# Patient Record
Sex: Female | Born: 1945
Health system: Southern US, Community
[De-identification: ages and names within clinical notes are randomized; demographics above are authoritative.]

## PROBLEM LIST (undated history)

## (undated) DIAGNOSIS — M5136 Other intervertebral disc degeneration, lumbar region: Secondary | ICD-10-CM

## (undated) DIAGNOSIS — K219 Gastro-esophageal reflux disease without esophagitis: Secondary | ICD-10-CM

## (undated) DIAGNOSIS — M51369 Other intervertebral disc degeneration, lumbar region without mention of lumbar back pain or lower extremity pain: Secondary | ICD-10-CM

## (undated) DIAGNOSIS — M1990 Unspecified osteoarthritis, unspecified site: Secondary | ICD-10-CM

## (undated) DIAGNOSIS — E785 Hyperlipidemia, unspecified: Secondary | ICD-10-CM

## (undated) DIAGNOSIS — M5412 Radiculopathy, cervical region: Secondary | ICD-10-CM

## (undated) DIAGNOSIS — G8929 Other chronic pain: Secondary | ICD-10-CM

## (undated) DIAGNOSIS — T7840XA Allergy, unspecified, initial encounter: Secondary | ICD-10-CM

## (undated) DIAGNOSIS — Z9889 Other specified postprocedural states: Secondary | ICD-10-CM

## (undated) DIAGNOSIS — N182 Chronic kidney disease, stage 2 (mild): Secondary | ICD-10-CM

## (undated) DIAGNOSIS — I10 Essential (primary) hypertension: Secondary | ICD-10-CM

## (undated) DIAGNOSIS — R112 Nausea with vomiting, unspecified: Secondary | ICD-10-CM

## (undated) DIAGNOSIS — M8580 Other specified disorders of bone density and structure, unspecified site: Secondary | ICD-10-CM

## (undated) HISTORY — PX: OTHER SURGICAL HISTORY: SHX169

## (undated) HISTORY — DX: Essential (primary) hypertension: I10

## (undated) HISTORY — DX: Other chronic pain: G89.29

## (undated) HISTORY — DX: Hyperlipidemia, unspecified: E78.5

## (undated) HISTORY — DX: Other specified disorders of bone density and structure, unspecified site: M85.80

## (undated) HISTORY — DX: Radiculopathy, cervical region: M54.12

## (undated) HISTORY — PX: JOINT REPLACEMENT: SHX530

## (undated) HISTORY — PX: APPENDECTOMY: SHX54

## (undated) HISTORY — PX: BUNIONECTOMY: SHX129

## (undated) HISTORY — DX: Unspecified osteoarthritis, unspecified site: M19.90

## (undated) HISTORY — PX: TUBAL LIGATION: SHX77

## (undated) HISTORY — DX: Chronic kidney disease, stage 2 (mild): N18.2

## (undated) HISTORY — PX: COLONOSCOPY: SHX174

## (undated) HISTORY — DX: Allergy, unspecified, initial encounter: T78.40XA

## (undated) HISTORY — PX: BREAST LUMPECTOMY: SHX2

---

## 1990-12-19 HISTORY — PX: BREAST BIOPSY: SHX20

## 1990-12-19 HISTORY — PX: BREAST LUMPECTOMY: SHX2

## 2006-12-19 HISTORY — PX: OTHER SURGICAL HISTORY: SHX169

## 2006-12-19 HISTORY — PX: JOINT REPLACEMENT: SHX530

## 2010-12-19 DIAGNOSIS — J189 Pneumonia, unspecified organism: Secondary | ICD-10-CM

## 2010-12-19 HISTORY — DX: Pneumonia, unspecified organism: J18.9

## 2011-12-20 HISTORY — PX: SPINE SURGERY: SHX786

## 2012-09-18 HISTORY — PX: CERVICAL DISCECTOMY: SHX98

## 2013-03-19 LAB — HM PAP SMEAR

## 2014-01-06 DIAGNOSIS — M1711 Unilateral primary osteoarthritis, right knee: Secondary | ICD-10-CM | POA: Insufficient documentation

## 2014-01-06 DIAGNOSIS — I10 Essential (primary) hypertension: Secondary | ICD-10-CM | POA: Insufficient documentation

## 2014-01-06 DIAGNOSIS — M159 Polyosteoarthritis, unspecified: Secondary | ICD-10-CM | POA: Insufficient documentation

## 2014-01-06 DIAGNOSIS — J309 Allergic rhinitis, unspecified: Secondary | ICD-10-CM | POA: Insufficient documentation

## 2014-01-06 DIAGNOSIS — M129 Arthropathy, unspecified: Secondary | ICD-10-CM | POA: Insufficient documentation

## 2015-01-01 DIAGNOSIS — R0982 Postnasal drip: Secondary | ICD-10-CM | POA: Diagnosis not present

## 2015-01-01 DIAGNOSIS — Z1239 Encounter for other screening for malignant neoplasm of breast: Secondary | ICD-10-CM | POA: Diagnosis not present

## 2015-01-01 DIAGNOSIS — J309 Allergic rhinitis, unspecified: Secondary | ICD-10-CM | POA: Diagnosis not present

## 2015-01-01 DIAGNOSIS — G4700 Insomnia, unspecified: Secondary | ICD-10-CM | POA: Diagnosis not present

## 2015-01-01 DIAGNOSIS — H9193 Unspecified hearing loss, bilateral: Secondary | ICD-10-CM | POA: Diagnosis not present

## 2015-01-01 DIAGNOSIS — K5900 Constipation, unspecified: Secondary | ICD-10-CM | POA: Diagnosis not present

## 2015-01-19 DIAGNOSIS — J309 Allergic rhinitis, unspecified: Secondary | ICD-10-CM | POA: Diagnosis not present

## 2015-01-19 DIAGNOSIS — J342 Deviated nasal septum: Secondary | ICD-10-CM | POA: Diagnosis not present

## 2015-01-19 DIAGNOSIS — F458 Other somatoform disorders: Secondary | ICD-10-CM | POA: Diagnosis not present

## 2015-01-23 DIAGNOSIS — J301 Allergic rhinitis due to pollen: Secondary | ICD-10-CM | POA: Diagnosis not present

## 2015-03-25 DIAGNOSIS — M533 Sacrococcygeal disorders, not elsewhere classified: Secondary | ICD-10-CM | POA: Diagnosis not present

## 2015-04-21 ENCOUNTER — Other Ambulatory Visit: Payer: Self-pay

## 2015-04-21 DIAGNOSIS — Z1389 Encounter for screening for other disorder: Secondary | ICD-10-CM | POA: Diagnosis not present

## 2015-04-21 DIAGNOSIS — M5416 Radiculopathy, lumbar region: Secondary | ICD-10-CM | POA: Diagnosis not present

## 2015-04-21 DIAGNOSIS — Z9181 History of falling: Secondary | ICD-10-CM | POA: Diagnosis not present

## 2015-04-21 DIAGNOSIS — G8929 Other chronic pain: Secondary | ICD-10-CM | POA: Diagnosis not present

## 2015-04-21 DIAGNOSIS — Z0000 Encounter for general adult medical examination without abnormal findings: Secondary | ICD-10-CM | POA: Diagnosis not present

## 2015-04-21 DIAGNOSIS — Z23 Encounter for immunization: Secondary | ICD-10-CM | POA: Diagnosis not present

## 2015-04-21 DIAGNOSIS — Z1239 Encounter for other screening for malignant neoplasm of breast: Secondary | ICD-10-CM | POA: Diagnosis not present

## 2015-04-21 DIAGNOSIS — Z1231 Encounter for screening mammogram for malignant neoplasm of breast: Secondary | ICD-10-CM

## 2015-05-07 ENCOUNTER — Other Ambulatory Visit: Payer: Self-pay

## 2015-05-07 ENCOUNTER — Ambulatory Visit
Admission: RE | Admit: 2015-05-07 | Discharge: 2015-05-07 | Disposition: A | Discharge status: Home / Self Care | Payer: Commercial Managed Care - HMO | Source: Ambulatory Visit | Attending: Family Medicine | Admitting: Family Medicine

## 2015-05-07 DIAGNOSIS — Z1231 Encounter for screening mammogram for malignant neoplasm of breast: Secondary | ICD-10-CM

## 2015-05-12 DIAGNOSIS — J358 Other chronic diseases of tonsils and adenoids: Secondary | ICD-10-CM | POA: Diagnosis not present

## 2015-05-22 ENCOUNTER — Telehealth: Payer: Self-pay | Admitting: Family Medicine

## 2015-05-22 DIAGNOSIS — J358 Other chronic diseases of tonsils and adenoids: Secondary | ICD-10-CM

## 2015-05-25 NOTE — Telephone Encounter (Signed)
Referral ordered

## 2015-05-27 ENCOUNTER — Encounter: Payer: Self-pay | Admitting: Family Medicine

## 2015-05-27 ENCOUNTER — Encounter (INDEPENDENT_AMBULATORY_CARE_PROVIDER_SITE_OTHER): Payer: Self-pay

## 2015-05-27 ENCOUNTER — Ambulatory Visit (INDEPENDENT_AMBULATORY_CARE_PROVIDER_SITE_OTHER): Payer: Commercial Managed Care - HMO | Admitting: Family Medicine

## 2015-05-27 VITALS — BP 122/74 | HR 83 | Temp 98.4°F | Resp 18 | Ht 63.0 in | Wt 145.0 lb

## 2015-05-27 DIAGNOSIS — K219 Gastro-esophageal reflux disease without esophagitis: Secondary | ICD-10-CM | POA: Insufficient documentation

## 2015-05-27 DIAGNOSIS — M4800 Spinal stenosis, site unspecified: Secondary | ICD-10-CM | POA: Diagnosis not present

## 2015-05-27 DIAGNOSIS — Z13220 Encounter for screening for lipoid disorders: Secondary | ICD-10-CM | POA: Diagnosis not present

## 2015-05-27 DIAGNOSIS — H9193 Unspecified hearing loss, bilateral: Secondary | ICD-10-CM | POA: Insufficient documentation

## 2015-05-27 DIAGNOSIS — M5416 Radiculopathy, lumbar region: Secondary | ICD-10-CM

## 2015-05-27 DIAGNOSIS — G8929 Other chronic pain: Secondary | ICD-10-CM | POA: Diagnosis not present

## 2015-05-27 DIAGNOSIS — Z0000 Encounter for general adult medical examination without abnormal findings: Secondary | ICD-10-CM | POA: Diagnosis not present

## 2015-05-27 DIAGNOSIS — Z13820 Encounter for screening for osteoporosis: Secondary | ICD-10-CM | POA: Insufficient documentation

## 2015-05-27 DIAGNOSIS — I10 Essential (primary) hypertension: Secondary | ICD-10-CM | POA: Diagnosis not present

## 2015-05-27 DIAGNOSIS — M5410 Radiculopathy, site unspecified: Secondary | ICD-10-CM

## 2015-05-27 NOTE — Progress Notes (Signed)
Name: Rachel James   MRN: [id number]    DOB: 10-23-1946   Date:05/27/2015       Progress Note  Subjective  Chief Complaint  Chief Complaint  Patient presents with  . Referral    Bone Density     HPI  Patient is here today for a Female Medicare Wellness Visit:  The patient has been in otherwise good general health and voices no acute concerns today. She needs a referral renewal for Dr. Sharlet James. She would also like to order her DEXA scan and routine blood work pertinent to her medical problems. Rachel James has a form from [facility] that needs to be completed and returned.   No problem-specific assessment & plan notes found for this encounter.  Active Ambulatory Problems    Diagnosis Date Noted  . Allergic rhinitis 01/06/2014  . Osteoarthrosis involving multiple sites 01/06/2014  . Arthropathia 01/06/2014  . Hypertension goal BP (blood pressure) < 150/90 01/06/2014  . Bilateral change in hearing 05/27/2015  . Spinal stenosis, multilevel 05/27/2015  . GERD (gastroesophageal reflux disease) 05/27/2015  . Chronic radicular low back pain 05/27/2015  . Screening for osteoporosis 05/27/2015  . Encounter for Medicare annual wellness exam 05/27/2015  . Screening cholesterol level 05/27/2015   Resolved Ambulatory Problems    Diagnosis Date Noted  . No Resolved Ambulatory Problems   No Additional Past Medical History    Past Surgical History  Procedure Laterality Date  . Tubal ligation Bilateral   . Breast lumpectomy Right   . Cervical discectomy  09/18/2012    C4-C5 AND C6-C7 ACDF   . Bunionectomy Bilateral   .  thumb surgery Left     Family History  Problem Relation Age of Onset  . Heart disease Mother   . Heart disease Father   . Heart disease Sister   . Heart disease Brother     History   Social History  . Marital Status: Unknown    Spouse Name: N/A  . Number of Children: N/A  . Years of Education: N/A   Occupational History  . retired    Social History  Main Topics  . Smoking status: Never Smoker   . Smokeless tobacco: Not on file  . Alcohol Use: No  . Drug Use: No  . Sexual Activity: Not Currently   Other Topics Concern  . Not on file   Social History Narrative     Current outpatient prescriptions:  .  ibuprofen (ADVIL,MOTRIN) 600 MG tablet, Take 600 mg by mouth., Disp: , Rfl:  .  ALPRAZolam (XANAX) 0.5 MG tablet, , Disp: , Rfl:  .  Cetirizine HCl 10 MG CAPS, , Disp: , Rfl:  .  chlorthalidone (HYGROTON) 25 MG tablet, Take by mouth., Disp: , Rfl:  .  fexofenadine-pseudoephedrine (ALLEGRA-D 24) 180-240 MG per 24 hr tablet, , Disp: , Rfl:  .  Heat Wraps (HEAT THERAPY PATCHES) MISC, , Disp: , Rfl:  .  loratadine (CLARITIN) 10 MG tablet, Take by mouth., Disp: , Rfl:  .  metoprolol succinate (TOPROL-XL) 25 MG 24 hr tablet, , Disp: , Rfl:  .  MULTIPLE VITAMINS-MINERALS ER PO, Take by mouth., Disp: , Rfl:  .  NAPROXEN 375 MG TBEC EC tablet, , Disp: , Rfl:  .  omeprazole (PRILOSEC) 20 MG capsule, Take 20 mg by mouth., Disp: , Rfl:  .  POTASSIUM CITRATE PO, Take 99 mg by mouth., Disp: , Rfl:  .  Sunscreens (RAY BLOCK SUNSCREEN) 3-7 % LOTN, , Disp: , Rfl:  Allergies  Allergen Reactions  . Codeine Other (See Comments) and Itching    nightmare    Fall Risk: Fall Risk  05/27/2015  Falls in the past year? No    Depression screen [facility] 2/9 05/27/2015  Decreased Interest 0  Down, Depressed, Hopeless 0  PHQ - 2 Score 0     ROS  CONSTITUTIONAL: No significant weight changes, fever, chills, weakness or fatigue.  HEENT:  - Eyes: No visual changes.  - Ears: No auditory changes. No pain.  - Nose: No sneezing, congestion, runny nose. - Throat: No sore throat. No changes in swallowing. SKIN: No rash or itching.  CARDIOVASCULAR: No chest pain, chest pressure or chest discomfort. No palpitations or edema.  RESPIRATORY: No shortness of breath, cough or sputum.  GASTROINTESTINAL: No anorexia, nausea, vomiting. No changes in bowel habits. No  abdominal pain or blood.  GENITOURINARY: No dysuria. No frequency. No discharge.  NEUROLOGICAL: No headache, dizziness, syncope, paralysis, ataxia, numbness or tingling in the extremities. No memory changes. No change in bowel or bladder control.  MUSCULOSKELETAL: No joint pain. No muscle pain. HEMATOLOGIC: No anemia, bleeding or bruising.  LYMPHATICS: No enlarged lymph nodes.  PSYCHIATRIC: No change in mood. No change in sleep pattern.  ENDOCRINOLOGIC: No reports of sweating, cold or heat intolerance. No polyuria or polydipsia.   Objective  Filed Vitals:   05/27/15 1042  BP: 122/74  Pulse: 83  Temp: 98.4 F (36.9 C)  TempSrc: Oral  Resp: 18  Height: 5\' 3"  (1.6 m)  Weight: 145 lb (65.772 kg)  SpO2: 94%    Physical Exam  Constitutional: Patient appears well-developed and well-nourished. In no distress.  HEENT:  - Head: Normocephalic and atraumatic.  - Ears: Bilateral TMs gray, no erythema or effusion - Nose: Nasal mucosa moist - Mouth/Throat: Oropharynx is clear and moist. No tonsillar hypertrophy or erythema. No post nasal drainage.  - Eyes: Conjunctivae clear, EOM movements normal. PERRLA. No scleral icterus.  Neck: Normal range of motion. Neck supple. No JVD present. No thyromegaly present.  Cardiovascular: Normal rate, regular rhythm and normal heart sounds.  No murmur heard.  Pulmonary/Chest: Effort normal and breath sounds normal. No respiratory distress. Musculoskeletal: Normal range of motion bilateral UE and LE, no joint effusions. Peripheral vascular: Bilateral LE no edema. Neurological: CN II-XII grossly intact with no focal deficits. Alert and oriented to person, place, and time. Coordination, balance, strength, speech and gait are normal.  Skin: Skin is warm and dry. No rash noted. No erythema.  Psychiatric: Patient has a normal mood and affect. Behavior is normal in office today. Judgment and thought content normal in office today.   Assessment & Plan  1.  Encounter for Medicare annual wellness exam Functional ability/safety issues: No Issues Hearing issues: Addressed  Activities of daily living: Discussed Home safety issues: No Issues  End Of Life Planning: Offered verbal information regarding advanced directives, healthcare power of attorney.  Preventative care, Health maintenance, Preventative health measures discussed.  Preventative screenings discussed today: lab work, colonoscopy, PAP, mammogram, DEXA.  Low Dose CT Chest recommended if Age 83-80 years, 30 pack-year currently smoking OR have quit w/in 15years.   Lifestyle risk factor issued reviewed: Diet, exercise, weight management, advised patient smoking is not healthy, nutrition/diet.  Preventative health measures discussed (5-10 year plan).  Reviewed and recommended vaccinations: - Pneumovax  - Prevnar  - Annual Influenza - Zostavax - Tdap   Depression screening: Done Fall risk screening: Done Discuss ADLs/IADLs: Done  Current medical providers: See  HPI  Other health risk factors identified this visit: No other issues Cognitive impairment issues: None identified  All above discussed with patient. Appropriate education, counseling and referral will be made based upon the above.    2. Spinal stenosis, multilevel Referal placed.  - Ambulatory referral to Sports Medicine - Vitamin D (25 hydroxy)  3. Chronic radicular low back pain Referal placed.  - Ambulatory referral to Sports Medicine - Vitamin D (25 hydroxy)  4. Screening for osteoporosis DEXA ordered.  - DG Bone Density; Future  5. Hypertension goal BP (blood pressure) < 150/90 Lab work ordered.  - CBC with Differential - COMPLETE METABOLIC PANEL WITH GFR - Vitamin D (25 hydroxy) - TSH  6. Screening cholesterol level Lab work ordered.  - Lipid Profile

## 2015-05-27 NOTE — Patient Instructions (Addendum)
  Ms. Wearing , Thank you for taking time to come for your Medicare Wellness Visit. I appreciate your ongoing commitment to your health goals. Please review the following plan we discussed and let me know if I can assist you in the future.   These are the goals we discussed: Maintain ideal BMI with regular exercise including toning and balance training. Eat regular meals consistent of a balanced diet. Continue mentally engaging activities such as puzzles, crosswords, reading, card games, social activities. Quit smoking if applicable. Complete advanced directives documentation with lawyer and bring copy to be scanned into medical records if not already done so. Complete any orders or referrals placed during today's visit.      This is a list of the screening recommended for you and due dates:  Health Maintenance  Topic Date Due  . Shingles Vaccine  02/27/2008  . Flu Shot  07/20/2015  . Mammogram  05/06/2017  . Colon Cancer Screening  01/19/2022  . Tetanus Vaccine  07/12/2024  . DEXA scan (bone density measurement)  Completed  . Pneumonia vaccines  Completed

## 2015-05-28 LAB — CBC WITH DIFFERENTIAL/PLATELET
Basophils Absolute: 0 10*3/uL (ref 0.0–0.2)
Basos: 1 %
EOS (ABSOLUTE): 0.2 10*3/uL (ref 0.0–0.4)
Eos: 4 %
HEMATOCRIT: 32.8 % — AB (ref 34.0–46.6)
HEMOGLOBIN: 11.1 g/dL (ref 11.1–15.9)
Immature Grans (Abs): 0 10*3/uL (ref 0.0–0.1)
Immature Granulocytes: 0 %
LYMPHS ABS: 1.5 10*3/uL (ref 0.7–3.1)
Lymphs: 27 %
MCH: 26.1 pg — AB (ref 26.6–33.0)
MCHC: 33.8 g/dL (ref 31.5–35.7)
MCV: 77 fL — ABNORMAL LOW (ref 79–97)
MONOCYTES: 8 %
MONOS ABS: 0.5 10*3/uL (ref 0.1–0.9)
Neutrophils Absolute: 3.3 10*3/uL (ref 1.4–7.0)
Neutrophils: 60 %
PLATELETS: 266 10*3/uL (ref 150–379)
RBC: 4.25 x10E6/uL (ref 3.77–5.28)
RDW: 14.7 % (ref 12.3–15.4)
WBC: 5.6 10*3/uL (ref 3.4–10.8)

## 2015-05-28 LAB — LIPID PANEL
Chol/HDL Ratio: 2.5 ratio units (ref 0.0–4.4)
Cholesterol, Total: 229 mg/dL — ABNORMAL HIGH (ref 100–199)
HDL: 90 mg/dL (ref >39)
LDL CALC: 126 mg/dL — AB (ref 0–99)
Triglycerides: 64 mg/dL (ref 0–149)
VLDL Cholesterol Cal: 13 mg/dL (ref 5–40)

## 2015-05-28 LAB — TSH: TSH: 1.38 u[IU]/mL (ref 0.450–4.500)

## 2015-05-28 LAB — VITAMIN D 25 HYDROXY (VIT D DEFICIENCY, FRACTURES): Vit D, 25-Hydroxy: 29.8 ng/mL — ABNORMAL LOW (ref 30.0–100.0)

## 2015-06-03 ENCOUNTER — Telehealth: Payer: Self-pay

## 2015-06-03 NOTE — Telephone Encounter (Signed)
Patient called and was asking about her referral to [facility]. Notified her , I have an appt scheduled for her on 06/24/2015 at 3:45pm with Dr. Beverly Gust.

## 2015-06-15 ENCOUNTER — Encounter: Payer: Self-pay | Admitting: Family Medicine

## 2015-06-15 NOTE — Telephone Encounter (Signed)
DEXA scan was ordered several weeks ago. Can we find out what the hold up is on scheduling this?

## 2015-06-16 ENCOUNTER — Other Ambulatory Visit: Payer: Self-pay | Admitting: Family Medicine

## 2015-06-16 ENCOUNTER — Encounter: Payer: Self-pay | Admitting: Family Medicine

## 2015-06-16 NOTE — Telephone Encounter (Signed)
Hud[place]eth, and scheduled the appt, she is going tomorrow 06/17/15 at 11:00am. Pt has been notified of this and I thanked her for her patience and understanding

## 2015-06-17 ENCOUNTER — Ambulatory Visit
Admission: RE | Admit: 2015-06-17 | Discharge: 2015-06-17 | Disposition: A | Discharge status: Home / Self Care | Payer: Commercial Managed Care - HMO | Source: Ambulatory Visit | Attending: Family Medicine | Admitting: Family Medicine

## 2015-06-17 DIAGNOSIS — Z780 Asymptomatic menopausal state: Secondary | ICD-10-CM | POA: Diagnosis not present

## 2015-06-17 DIAGNOSIS — M898X8 Other specified disorders of bone, other site: Secondary | ICD-10-CM | POA: Diagnosis not present

## 2015-06-17 DIAGNOSIS — M8580 Other specified disorders of bone density and structure, unspecified site: Secondary | ICD-10-CM | POA: Insufficient documentation

## 2015-06-17 DIAGNOSIS — Z13820 Encounter for screening for osteoporosis: Secondary | ICD-10-CM

## 2015-06-24 DIAGNOSIS — H60339 Swimmer's ear, unspecified ear: Secondary | ICD-10-CM | POA: Diagnosis not present

## 2015-06-24 DIAGNOSIS — J3501 Chronic tonsillitis: Secondary | ICD-10-CM | POA: Diagnosis not present

## 2015-06-24 DIAGNOSIS — J358 Other chronic diseases of tonsils and adenoids: Secondary | ICD-10-CM | POA: Diagnosis not present

## 2015-07-03 DIAGNOSIS — M47816 Spondylosis without myelopathy or radiculopathy, lumbar region: Secondary | ICD-10-CM | POA: Diagnosis not present

## 2015-07-03 DIAGNOSIS — M5136 Other intervertebral disc degeneration, lumbar region: Secondary | ICD-10-CM | POA: Diagnosis not present

## 2015-07-16 ENCOUNTER — Telehealth: Payer: Self-pay | Admitting: Family Medicine

## 2015-07-16 DIAGNOSIS — M4716 Other spondylosis with myelopathy, lumbar region: Secondary | ICD-10-CM | POA: Insufficient documentation

## 2015-07-16 DIAGNOSIS — M47816 Spondylosis without myelopathy or radiculopathy, lumbar region: Secondary | ICD-10-CM | POA: Diagnosis not present

## 2015-07-16 DIAGNOSIS — M47817 Spondylosis without myelopathy or radiculopathy, lumbosacral region: Secondary | ICD-10-CM | POA: Insufficient documentation

## 2015-07-16 NOTE — Telephone Encounter (Signed)
PT STATES THAT HER BLOOD PRESSURE IS VARYING. SHE IS LIGHT HEADED AND SINCE tUESDAY  TO KNOW IT IS RUNNING 150-96 150-94. WANTS TO KNOW IF SHE NEEDS TO MAKE AN APPT OR WHAT DO YOU SUGGEST.

## 2015-07-16 NOTE — Telephone Encounter (Signed)
Pt coming in on aug1st and was informed to bring machine at visit

## 2015-07-16 NOTE — Telephone Encounter (Signed)
Yes office visit and instruct patient to bring home blood pressure machine with her to compare.

## 2015-07-20 ENCOUNTER — Ambulatory Visit (INDEPENDENT_AMBULATORY_CARE_PROVIDER_SITE_OTHER): Payer: Commercial Managed Care - HMO | Admitting: Family Medicine

## 2015-07-20 ENCOUNTER — Encounter: Payer: Self-pay | Admitting: Family Medicine

## 2015-07-20 VITALS — BP 142/92 | HR 82 | Temp 99.0°F | Resp 14 | Ht 63.0 in | Wt 144.2 lb

## 2015-07-20 DIAGNOSIS — I10 Essential (primary) hypertension: Secondary | ICD-10-CM

## 2015-07-20 NOTE — Progress Notes (Signed)
Name: Rachel James   MRN: [id number]    DOB: 1946-05-31   Date:07/20/2015       Progress Note  Subjective  Chief Complaint  Chief Complaint  Patient presents with  . Hypertension    running high on at home bp monitor which compare with ours.  Also experienced some light headedness last week     HPI  Patient is here for routine follow up of Hypertension. First diagnosed with hypertension several years ago. Current anti-hypertension medication regimen includes dietary modification, weight management and Metoprolol Succinate 25mg  one a day.  She self discontinued Chlorthalidone 25mg  one a day after relating it to night time muscle cramps in her legs despite taking potassium and magnesium. Muscle cramps have since resolved.  Patient is following physician recommended management otherwise. Checking blood pressure outside of physician office. Results average systolic 338-250 and average diastolic 53-97.  Associated symptoms do not include headache, dizziness, nausea, lower extremity swelling, shortness of breath, chest pain, numbness.  She feels that it may be related to recent stress. She is also going to Dr. Sharlet Salina for spinal shots but finds them to be not as effective as they were previously. Tizanidine kept her up after 3 hrs of sleep so she is planning to use them during the day instead.  Active Ambulatory Problems    Diagnosis Date Noted  . Allergic rhinitis 01/06/2014  . Osteoarthrosis involving multiple sites 01/06/2014  . Arthropathia 01/06/2014  . Hypertension goal BP (blood pressure) < 150/90 01/06/2014  . Bilateral change in hearing 05/27/2015  . Spinal stenosis, multilevel 05/27/2015  . GERD (gastroesophageal reflux disease) 05/27/2015  . Chronic radicular low back pain 05/27/2015  . Screening for osteoporosis 05/27/2015  . Encounter for Medicare annual wellness exam 05/27/2015  . Screening cholesterol level 05/27/2015  . Degenerative arthritis of lumbar spine with cord  compression 07/16/2015   Resolved Ambulatory Problems    Diagnosis Date Noted  . No Resolved Ambulatory Problems   Past Medical History  Diagnosis Date  . Hypertension      Past Surgical History  Procedure Laterality Date  . Tubal ligation Bilateral   . Breast lumpectomy Right   . Cervical discectomy  09/18/2012    C4-C5 AND C6-C7 ACDF   . Bunionectomy Bilateral   .  thumb surgery Left     Family History  Problem Relation Age of Onset  . Heart disease Mother   . Heart disease Father   . Heart disease Sister   . Heart disease Brother     History   Social History  . Marital Status: Unknown    Spouse Name: N/A  . Number of Children: N/A  . Years of Education: N/A   Occupational History  . retired    Social History Main Topics  . Smoking status: Never Smoker   . Smokeless tobacco: Not on file  . Alcohol Use: No  . Drug Use: No  . Sexual Activity: Not Currently   Other Topics Concern  . Not on file   Social History Narrative     Current outpatient prescriptions:  .  ALPRAZolam (XANAX) 0.5 MG tablet, , Disp: , Rfl:  .  Cetirizine HCl 10 MG CAPS, , Disp: , Rfl:  .  fexofenadine-pseudoephedrine (ALLEGRA-D 24) 180-240 MG per 24 hr tablet, , Disp: , Rfl:  .  Heat Wraps (HEAT THERAPY PATCHES) MISC, , Disp: , Rfl:  .  ibuprofen (ADVIL,MOTRIN) 600 MG tablet, Take 600 mg by mouth., Disp: , Rfl:  .  loratadine (CLARITIN) 10 MG tablet, Take by mouth., Disp: , Rfl:  .  metoprolol succinate (TOPROL-XL) 25 MG 24 hr tablet, , Disp: , Rfl:  .  MULTIPLE VITAMINS-MINERALS ER PO, Take by mouth., Disp: , Rfl:  .  NAPROXEN 375 MG TBEC EC tablet, , Disp: , Rfl:  .  omeprazole (PRILOSEC) 20 MG capsule, Take 20 mg by mouth., Disp: , Rfl:  .  Sunscreens (RAY BLOCK SUNSCREEN) 3-7 % LOTN, , Disp: , Rfl:  .  tiZANidine (ZANAFLEX) 4 MG tablet, , Disp: , Rfl:   Allergies  Allergen Reactions  . Codeine Other (See Comments) and Itching    nightmare     ROS  CONSTITUTIONAL: No  significant weight changes, fever, chills, weakness or fatigue.  HEENT:  - Eyes: No visual changes.  - Ears: No auditory changes. No pain.  - Nose: No sneezing, congestion, runny nose. - Throat: No sore throat. No changes in swallowing. SKIN: No rash or itching.  CARDIOVASCULAR: No chest pain, chest pressure or chest discomfort. No palpitations or edema.  RESPIRATORY: No shortness of breath, cough or sputum.  GASTROINTESTINAL: No anorexia, nausea, vomiting. No changes in bowel habits. No abdominal pain or blood.  GENITOURINARY: No dysuria. No frequency. No discharge.  NEUROLOGICAL: No headache, dizziness, syncope, paralysis, ataxia, numbness or tingling in the extremities. No memory changes. No change in bowel or bladder control.  MUSCULOSKELETAL: No joint pain. No muscle pain. HEMATOLOGIC: No anemia, bleeding or bruising.  LYMPHATICS: No enlarged lymph nodes.  PSYCHIATRIC: No change in mood. No change in sleep pattern.  ENDOCRINOLOGIC: No reports of sweating, cold or heat intolerance. No polyuria or polydipsia.   Objective  Filed Vitals:   07/20/15 1343  BP: 142/92  Pulse: 82  Temp: 99 F (37.2 C)  TempSrc: Oral  Resp: 14  Height: 5\' 3"  (1.6 m)  Weight: 144 lb 3.2 oz (65.409 kg)  SpO2: 98%   Body mass index is 25.55 kg/(m^2).  Physical Exam  Constitutional: Patient appears well-developed and well-nourished. In no distress.  Neck: Normal range of motion. Neck supple. No JVD present. No thyromegaly present.  Cardiovascular: Normal rate, regular rhythm and normal heart sounds.  No murmur heard.  Pulmonary/Chest: Effort normal and breath sounds normal. No respiratory distress. Musculoskeletal: Normal range of motion bilateral UE and LE, no joint effusions. Peripheral vascular: Bilateral LE no edema. Neurological: CN II-XII grossly intact with no focal deficits. Alert and oriented to person, place, and time. Coordination, balance, strength, speech and gait are normal.  Skin:  Skin is warm and dry. No rash noted. No erythema.  Psychiatric: Patient has a normal mood and affect. Behavior is normal in office today. Judgment and thought content normal in office today.   Assessment & Plan  1. Hypertension goal BP (blood pressure) < 150/90 Home BP machine results aligns well with clinic results. We discussed goal BPs as well as alternative therapy options such as HCTZ, ACEi or amlodipine. She was reluctant to use ACEi as her sister in law had cough side effect. She was also concerned about cost and picking up new medication. She was however willing to use left over chlorthalidone 25mg  one po prn BP greater than 150/90 while she is on vacation for the next few weeks and follow up with me if blood pressure becomes consistently elevated or she becomes symptomatic.

## 2015-08-13 ENCOUNTER — Other Ambulatory Visit: Payer: Self-pay | Admitting: Family Medicine

## 2015-08-13 DIAGNOSIS — I10 Essential (primary) hypertension: Secondary | ICD-10-CM

## 2015-08-26 ENCOUNTER — Telehealth: Payer: Self-pay | Admitting: Family Medicine

## 2015-08-26 NOTE — Telephone Encounter (Signed)
Patient scheduled an appointment for Sept 29, 2016. Would like to know if you would like to order lab work that way the results will be in when she comes in. The appointment is for checking her cholesterol and her blood pressure. Her blood pressure is not coming down the way she want it to.

## 2015-08-27 ENCOUNTER — Other Ambulatory Visit: Payer: Self-pay | Admitting: Family Medicine

## 2015-08-27 DIAGNOSIS — T732XXA Exhaustion due to exposure, initial encounter: Secondary | ICD-10-CM

## 2015-08-27 DIAGNOSIS — I10 Essential (primary) hypertension: Secondary | ICD-10-CM

## 2015-08-27 DIAGNOSIS — Z13220 Encounter for screening for lipoid disorders: Secondary | ICD-10-CM

## 2015-08-27 DIAGNOSIS — R5383 Other fatigue: Secondary | ICD-10-CM

## 2015-08-27 NOTE — Telephone Encounter (Signed)
Patient was informed via voice mail.

## 2015-08-27 NOTE — Telephone Encounter (Signed)
Lab work ordered and ready to be picked up, remind patient to be fasting at least 8 hrs prior to blood draw.

## 2015-08-28 DIAGNOSIS — M5136 Other intervertebral disc degeneration, lumbar region: Secondary | ICD-10-CM | POA: Diagnosis not present

## 2015-08-28 DIAGNOSIS — M47816 Spondylosis without myelopathy or radiculopathy, lumbar region: Secondary | ICD-10-CM | POA: Diagnosis not present

## 2015-08-28 DIAGNOSIS — M4806 Spinal stenosis, lumbar region: Secondary | ICD-10-CM | POA: Diagnosis not present

## 2015-09-08 DIAGNOSIS — I10 Essential (primary) hypertension: Secondary | ICD-10-CM | POA: Diagnosis not present

## 2015-09-08 DIAGNOSIS — R5383 Other fatigue: Secondary | ICD-10-CM | POA: Diagnosis not present

## 2015-09-08 DIAGNOSIS — Z13220 Encounter for screening for lipoid disorders: Secondary | ICD-10-CM | POA: Diagnosis not present

## 2015-09-09 ENCOUNTER — Encounter: Payer: Self-pay | Admitting: Family Medicine

## 2015-09-09 LAB — CBC WITH DIFFERENTIAL/PLATELET
BASOS ABS: 0.1 10*3/uL (ref 0.0–0.2)
BASOS: 1 %
EOS (ABSOLUTE): 0.3 10*3/uL (ref 0.0–0.4)
Eos: 6 %
Hematocrit: 35.2 % (ref 34.0–46.6)
Hemoglobin: 11.7 g/dL (ref 11.1–15.9)
Immature Grans (Abs): 0 10*3/uL (ref 0.0–0.1)
Immature Granulocytes: 0 %
Lymphocytes Absolute: 1.5 10*3/uL (ref 0.7–3.1)
Lymphs: 34 %
MCH: 26.5 pg — AB (ref 26.6–33.0)
MCHC: 33.2 g/dL (ref 31.5–35.7)
MCV: 80 fL (ref 79–97)
MONOS ABS: 0.5 10*3/uL (ref 0.1–0.9)
Monocytes: 12 %
NEUTROS ABS: 2 10*3/uL (ref 1.4–7.0)
Neutrophils: 47 %
PLATELETS: 242 10*3/uL (ref 150–379)
RBC: 4.41 x10E6/uL (ref 3.77–5.28)
RDW: 18 % — AB (ref 12.3–15.4)
WBC: 4.4 10*3/uL (ref 3.4–10.8)

## 2015-09-09 LAB — COMPREHENSIVE METABOLIC PANEL
A/G RATIO: 1.8 (ref 1.1–2.5)
ALK PHOS: 57 IU/L (ref 39–117)
ALT: 38 IU/L — AB (ref 0–32)
AST: 33 IU/L (ref 0–40)
Albumin: 4.2 g/dL (ref 3.6–4.8)
BILIRUBIN TOTAL: 0.4 mg/dL (ref 0.0–1.2)
BUN/Creatinine Ratio: 18 (ref 11–26)
BUN: 17 mg/dL (ref 8–27)
CHLORIDE: 97 mmol/L (ref 97–108)
CO2: 27 mmol/L (ref 18–29)
Calcium: 9.5 mg/dL (ref 8.7–10.3)
Creatinine, Ser: 0.92 mg/dL (ref 0.57–1.00)
GFR calc non Af Amer: 65 mL/min/{1.73_m2} (ref >59)
GFR, EST AFRICAN AMERICAN: 75 mL/min/{1.73_m2} (ref >59)
Globulin, Total: 2.3 g/dL (ref 1.5–4.5)
Glucose: 83 mg/dL (ref 65–99)
POTASSIUM: 4.7 mmol/L (ref 3.5–5.2)
Sodium: 138 mmol/L (ref 134–144)
Total Protein: 6.5 g/dL (ref 6.0–8.5)

## 2015-09-09 LAB — LIPID PANEL
CHOLESTEROL TOTAL: 228 mg/dL — AB (ref 100–199)
Chol/HDL Ratio: 3.1 ratio units (ref 0.0–4.4)
HDL: 74 mg/dL (ref >39)
LDL Calculated: 136 mg/dL — ABNORMAL HIGH (ref 0–99)
TRIGLYCERIDES: 91 mg/dL (ref 0–149)
VLDL Cholesterol Cal: 18 mg/dL (ref 5–40)

## 2015-09-09 LAB — TSH: TSH: 2.27 u[IU]/mL (ref 0.450–4.500)

## 2015-09-17 ENCOUNTER — Ambulatory Visit (INDEPENDENT_AMBULATORY_CARE_PROVIDER_SITE_OTHER): Payer: Commercial Managed Care - HMO | Admitting: Family Medicine

## 2015-09-17 ENCOUNTER — Encounter: Payer: Self-pay | Admitting: Family Medicine

## 2015-09-17 VITALS — BP 118/68 | HR 81 | Temp 98.8°F | Resp 16 | Ht 63.0 in | Wt 146.8 lb

## 2015-09-17 DIAGNOSIS — I10 Essential (primary) hypertension: Secondary | ICD-10-CM

## 2015-09-17 DIAGNOSIS — E785 Hyperlipidemia, unspecified: Secondary | ICD-10-CM

## 2015-09-17 DIAGNOSIS — Z23 Encounter for immunization: Secondary | ICD-10-CM | POA: Diagnosis not present

## 2015-09-17 MED ORDER — SIMVASTATIN 10 MG PO TABS: 10.0000 mg | ORAL_TABLET | Freq: Every day | ORAL | Status: DC

## 2015-09-17 NOTE — Progress Notes (Signed)
Name: Rachel James   MRN: [id number]    DOB: 1946/02/05   Date:09/17/2015       Progress Note  Subjective  Chief Complaint  Chief Complaint  Patient presents with  . Hypertension    patient is here for a follow-up  . Hyperlipidemia    HPI  Rachel James is a 69 year old female here today to discuss her recent lab work and follow up for HTN. First diagnosed with hypertension several years ago. Current anti-hypertension medication regimen includes dietary modification, weight management and Metoprolol Succinate 25mg  one a day. She self discontinued Chlorthalidone 25mg  one a day after relating it to night time muscle cramps in her legs in the past but today reports she is back on it taking 1/2 tablet a day. Muscle cramps have since resolved. Patient is following physician recommended management otherwise. Checking blood pressure outside of physician office. Results average systolic 967-591 and average diastolic 63-84. Associated symptoms do not include headache, dizziness, nausea, lower extremity swelling, shortness of breath, chest pain, numbness. Related diagnoses includes HLD. FLP done recently 09/08/15 shows elevated total and LDL cholesterol. She is taking fish oil daily. Sister has high cholesterol as well.   Past Medical History  Diagnosis Date  . Hypertension     Patient Active Problem List   Diagnosis Date Noted  . Degenerative arthritis of lumbar spine with cord compression 07/16/2015  . Lumbar and sacral osteoarthritis 07/16/2015  . Bilateral change in hearing 05/27/2015  . Spinal stenosis, multilevel 05/27/2015  . GERD (gastroesophageal reflux disease) 05/27/2015  . Chronic radicular low back pain 05/27/2015  . Screening for osteoporosis 05/27/2015  . Encounter for Medicare annual wellness exam 05/27/2015  . Screening cholesterol level 05/27/2015  . Allergic rhinitis 01/06/2014  . Osteoarthrosis involving multiple sites 01/06/2014  . Arthropathia 01/06/2014  .  Hypertension goal BP (blood pressure) < 140/90 01/06/2014    Social History  Substance Use Topics  . Smoking status: Never Smoker   . Smokeless tobacco: Not on file  . Alcohol Use: No     Current outpatient prescriptions:  .  ALPRAZolam (XANAX) 0.5 MG tablet, , Disp: , Rfl:  .  Cetirizine HCl 10 MG CAPS, , Disp: , Rfl:  .  chlorthalidone (HYGROTON) 25 MG tablet, Take 12.5 mg by mouth daily., Disp: , Rfl:  .  diclofenac (VOLTAREN) 75 MG EC tablet, , Disp: , Rfl:  .  Fish Oil OIL, by Does not apply route., Disp: , Rfl:  .  magnesium 30 MG tablet, Take 30 mg by mouth 2 (two) times daily., Disp: , Rfl:  .  metoprolol succinate (TOPROL-XL) 25 MG 24 hr tablet, TAKE 1 TABLET EVERY DAY, Disp: 90 tablet, Rfl: 3 .  MULTIPLE VITAMINS-MINERALS ER PO, Take by mouth., Disp: , Rfl:  .  NAPROXEN 375 MG TBEC EC tablet, , Disp: , Rfl:  .  omeprazole (PRILOSEC) 20 MG capsule, Take 20 mg by mouth., Disp: , Rfl:  .  Sunscreens (RAY BLOCK SUNSCREEN) 3-7 % LOTN, , Disp: , Rfl:  .  tiZANidine (ZANAFLEX) 4 MG tablet, , Disp: , Rfl:  .  Triamcinolone Acetonide (NASACORT ALLERGY 24HR NA), , Disp: , Rfl:  .  fexofenadine-pseudoephedrine (ALLEGRA-D 24) 180-240 MG per 24 hr tablet, , Disp: , Rfl:  .  Heat Wraps (HEAT THERAPY PATCHES) MISC, , Disp: , Rfl:  .  ibuprofen (ADVIL,MOTRIN) 600 MG tablet, Take 600 mg by mouth., Disp: , Rfl:  .  loratadine (CLARITIN) 10 MG tablet, Take  by mouth., Disp: , Rfl:   Past Surgical History  Procedure Laterality Date  . Tubal ligation Bilateral   . Breast lumpectomy Right   . Cervical discectomy  09/18/2012    C4-C5 AND C6-C7 ACDF   . Bunionectomy Bilateral   .  thumb surgery Left     Family History  Problem Relation Age of Onset  . Heart disease Mother   . Heart disease Father   . Heart disease Sister   . Heart disease Brother     Allergies  Allergen Reactions  . Codeine Other (See Comments) and Itching    nightmare     Review of Systems  CONSTITUTIONAL:  No significant weight changes, fever, chills, weakness or fatigue.  CARDIOVASCULAR: No chest pain, chest pressure or chest discomfort. No palpitations or edema.  RESPIRATORY: No shortness of breath, cough or sputum.  NEUROLOGICAL: No headache, dizziness, syncope, paralysis, ataxia, numbness or tingling in the extremities. No memory changes. No change in bowel or bladder control.  MUSCULOSKELETAL: Chronic joint pain. No muscle pain. HEMATOLOGIC: No anemia, bleeding or bruising.  LYMPHATICS: No enlarged lymph nodes.  PSYCHIATRIC: No change in mood. No change in sleep pattern.  ENDOCRINOLOGIC: No reports of sweating, cold or heat intolerance. No polyuria or polydipsia.     Objective  BP 118/68 mmHg  Pulse 81  Temp(Src) 98.8 F (37.1 C) (Oral)  Resp 16  Ht 5\' 3"  (1.6 m)  Wt 146 lb 12.8 oz (66.588 kg)  BMI 26.01 kg/m2  SpO2 95% Body mass index is 26.01 kg/(m^2).  Physical Exam  Constitutional: Patient appears well-developed and well-nourished. In no distress.  Cardiovascular: Normal rate, regular rhythm and normal heart sounds.  No murmur heard.  Pulmonary/Chest: Effort normal and breath sounds normal. No respiratory distress. Musculoskeletal: Normal range of motion bilateral UE and LE, no joint effusions. Peripheral vascular: Bilateral LE no edema. Neurological: CN II-XII grossly intact with no focal deficits. Alert and oriented to person, place, and time. Coordination, balance, strength, speech and gait are normal.  Skin: Skin is warm and dry. No rash noted. No erythema.  Psychiatric: Patient has a normal mood and affect. Behavior is normal in office today. Judgment and thought content normal in office today.   Recent Results (from the past 2160 hour(s))  CBC with Differential/Platelet     Status: Abnormal   Collection Time: 09/08/15  8:54 AM  Result Value Ref Range   WBC 4.4 3.4 - 10.8 x10E3/uL   RBC 4.41 3.77 - 5.28 x10E6/uL   Hemoglobin 11.7 11.1 - 15.9 g/dL   Hematocrit  35.2 34.0 - 46.6 %   MCV 80 79 - 97 fL   MCH 26.5 (L) 26.6 - 33.0 pg   MCHC 33.2 31.5 - 35.7 g/dL   RDW 18.0 (H) 12.3 - 15.4 %   Platelets 242 150 - 379 x10E3/uL   Neutrophils 47 %   Lymphs 34 %   Monocytes 12 %   Eos 6 %   Basos 1 %   Neutrophils Absolute 2.0 1.4 - 7.0 x10E3/uL   Lymphocytes Absolute 1.5 0.7 - 3.1 x10E3/uL   Monocytes Absolute 0.5 0.1 - 0.9 x10E3/uL   EOS (ABSOLUTE) 0.3 0.0 - 0.4 x10E3/uL   Basophils Absolute 0.1 0.0 - 0.2 x10E3/uL   Immature Granulocytes 0 %   Immature Grans (Abs) 0.0 0.0 - 0.1 x10E3/uL  Comprehensive metabolic panel     Status: Abnormal   Collection Time: 09/08/15  8:54 AM  Result Value Ref Range  Glucose 83 65 - 99 mg/dL   BUN 17 8 - 27 mg/dL   Creatinine, Ser 0.92 0.57 - 1.00 mg/dL   GFR calc non Af Amer 65 >59 mL/min/1.73   GFR calc Af Amer 75 >59 mL/min/1.73   BUN/Creatinine Ratio 18 11 - 26   Sodium 138 134 - 144 mmol/L   Potassium 4.7 3.5 - 5.2 mmol/L   Chloride 97 97 - 108 mmol/L   CO2 27 18 - 29 mmol/L   Calcium 9.5 8.7 - 10.3 mg/dL   Total Protein 6.5 6.0 - 8.5 g/dL   Albumin 4.2 3.6 - 4.8 g/dL   Globulin, Total 2.3 1.5 - 4.5 g/dL   Albumin/Globulin Ratio 1.8 1.1 - 2.5   Bilirubin Total 0.4 0.0 - 1.2 mg/dL   Alkaline Phosphatase 57 39 - 117 IU/L   AST 33 0 - 40 IU/L   ALT 38 (H) 0 - 32 IU/L  Lipid panel     Status: Abnormal   Collection Time: 09/08/15  8:54 AM  Result Value Ref Range   Cholesterol, Total 228 (H) 100 - 199 mg/dL   Triglycerides 91 0 - 149 mg/dL   HDL 74 >39 mg/dL    Comment: According to ATP-III Guidelines, HDL-C >59 mg/dL is considered a negative risk factor for CHD.    VLDL Cholesterol Cal 18 5 - 40 mg/dL   LDL Calculated 136 (H) 0 - 99 mg/dL   Chol/HDL Ratio 3.1 0.0 - 4.4 ratio units    Comment:                                   T. Chol/HDL Ratio                                             Men  Women                               1/2 Avg.Risk  3.4    3.3                                   Avg.Risk   5.0    4.4                                2X Avg.Risk  9.6    7.1                                3X Avg.Risk 23.4   11.0   TSH     Status: None   Collection Time: 09/08/15  8:54 AM  Result Value Ref Range   TSH 2.270 0.450 - 4.500 uIU/mL     Assessment & Plan  1. Hypertension goal BP (blood pressure) < 140/90 Improved. Continue current regimen of Toprol XL 25 mg a day plus Chlorthalidone 25 mg 1/2 tablet a day.  2. Hyperlipidemia LDL goal <100 Discussed treatment options and decided on starting statin therapy. The patient has been counseled on the proper use, side effects and potential interactions of the new medication. Patient encouraged to  review the side effects and safety profile pamphlet provided with the prescription from the pharmacy as well as request counseling from the pharmacy team as needed.   - simvastatin (ZOCOR) 10 MG tablet; Take 1 tablet (10 mg total) by mouth daily.  Dispense: 90 tablet; Refill: 2  3. Need for immunization against influenza  - Flu vaccine HIGH DOSE PF (Fluzone High dose)

## 2015-09-25 ENCOUNTER — Ambulatory Visit: Admit: 2015-09-25 | Payer: Commercial Managed Care - HMO | Admitting: Unknown Physician Specialty

## 2015-09-25 SURGERY — TONSILLECTOMY
Anesthesia: General

## 2015-09-27 DIAGNOSIS — K089 Disorder of teeth and supporting structures, unspecified: Secondary | ICD-10-CM | POA: Diagnosis not present

## 2015-10-16 ENCOUNTER — Other Ambulatory Visit: Payer: Self-pay | Admitting: Family Medicine

## 2015-11-26 ENCOUNTER — Telehealth: Payer: Self-pay | Admitting: Family Medicine

## 2015-11-26 ENCOUNTER — Other Ambulatory Visit: Payer: Self-pay | Admitting: Family Medicine

## 2015-11-26 DIAGNOSIS — R059 Cough, unspecified: Secondary | ICD-10-CM

## 2015-11-26 DIAGNOSIS — R05 Cough: Secondary | ICD-10-CM

## 2015-11-26 MED ORDER — BENZONATATE 200 MG PO CAPS: 200.0000 mg | ORAL_CAPSULE | Freq: Three times a day (TID) | ORAL | Status: DC | PRN

## 2015-11-26 NOTE — Telephone Encounter (Signed)
Pt stated that she has been having a nagging cough since 11/21/15.  Patient wanted to know if there was something going around and wanted advice as to what she should do.  Patient stated that she has been taking a cough suppressant (dextromethorphan) that seems to help but taking it at night keeps her up and that is when the cough is the worse.  She also stated that she is on her way out town and will be gone for 3 days.  Please advise

## 2015-11-26 NOTE — Telephone Encounter (Signed)
Patient was informed of Dr. Allie Dimmer message and said thank you very much.

## 2015-11-26 NOTE — Telephone Encounter (Signed)
Viral URIs are certainly going around this time of year and a dry nagging cough may last up to 2 weeks. Unfortunately without an exam I can not say if it has progressed to a bacterial infection or not. So I have sent in a prescription cough medication to see if it helps with her symptoms otherwise follow up in office after her trip if symptoms persist.

## 2015-12-09 ENCOUNTER — Encounter: Payer: Self-pay | Admitting: Family Medicine

## 2015-12-09 ENCOUNTER — Ambulatory Visit (INDEPENDENT_AMBULATORY_CARE_PROVIDER_SITE_OTHER): Payer: Commercial Managed Care - HMO | Admitting: Family Medicine

## 2015-12-09 VITALS — BP 102/66 | HR 82 | Temp 98.1°F | Resp 16 | Wt 140.3 lb

## 2015-12-09 DIAGNOSIS — W19XXXA Unspecified fall, initial encounter: Secondary | ICD-10-CM | POA: Diagnosis not present

## 2015-12-09 DIAGNOSIS — J0100 Acute maxillary sinusitis, unspecified: Secondary | ICD-10-CM | POA: Diagnosis not present

## 2015-12-09 MED ORDER — AMOXICILLIN-POT CLAVULANATE 875-125 MG PO TABS: 1.0000 | ORAL_TABLET | Freq: Two times a day (BID) | ORAL | Status: DC

## 2015-12-09 NOTE — Patient Instructions (Signed)

## 2015-12-09 NOTE — Progress Notes (Signed)
Name: Rachel James   MRN: [id number]    DOB: [birth year]   Date:12/09/2015       Progress Note  Subjective  Chief Complaint  Chief Complaint  Patient presents with  . URI    cough and post nasal drip for about 2 1/2 weeks. patient will be traveling soon.    HPI  Patient is here today with concerns regarding the following symptoms sore throat, post nasal drip, ear pressure, sinus pressure and non productive cough that started about 2 weeks ago.  Associated with fatigue. Has tried the following home remedies: Tessalon Perls and Delsym.  Still having tonsil stones which she extracts herself.   Had a fall last Friday on the street, tripped on something on the floor, landed right wrist and right anterior chest wall. Achy for a few days now resolving. No loss of function or suspected fractures.   Active Ambulatory Problems    Diagnosis Date Noted  . Allergic rhinitis 01/06/2014  . Osteoarthrosis involving multiple sites 01/06/2014  . Arthropathia 01/06/2014  . Hypertension goal BP (blood pressure) < 140/90 01/06/2014  . Bilateral change in hearing 05/27/2015  . Spinal stenosis, multilevel 05/27/2015  . GERD (gastroesophageal reflux disease) 05/27/2015  . Chronic radicular low back pain 05/27/2015  . Degenerative arthritis of lumbar spine with cord compression 07/16/2015  . Lumbar and sacral osteoarthritis 07/16/2015  . Hyperlipidemia LDL goal <100 09/17/2015  . Need for immunization against influenza 09/17/2015  . Acute maxillary sinusitis 12/09/2015  . Accidental fall 12/09/2015   Resolved Ambulatory Problems    Diagnosis Date Noted  . Screening for osteoporosis 05/27/2015  . Encounter for Medicare annual wellness exam 05/27/2015  . Screening cholesterol level 05/27/2015   Past Medical History  Diagnosis Date  . Hypertension   . Allergy   . Arthritis     Social History  Substance Use Topics  . Smoking status: Never Smoker   . Smokeless tobacco: Not on file  .  Alcohol Use: No     Current outpatient prescriptions:  .  ALPRAZolam (XANAX) 0.5 MG tablet, , Disp: , Rfl:  .  benzonatate (TESSALON) 200 MG capsule, Take 1 capsule (200 mg total) by mouth 3 (three) times daily as needed for cough., Disp: 30 capsule, Rfl: 0 .  Cetirizine HCl 10 MG CAPS, , Disp: , Rfl:  .  chlorthalidone (HYGROTON) 25 MG tablet, TAKE 1/2 TABLET EVERY DAY, Disp: 45 tablet, Rfl: 3 .  diclofenac (VOLTAREN) 75 MG EC tablet, , Disp: , Rfl:  .  fexofenadine-pseudoephedrine (ALLEGRA-D 24) 180-240 MG per 24 hr tablet, , Disp: , Rfl:  .  Fish Oil OIL, by Does not apply route., Disp: , Rfl:  .  Heat Wraps (HEAT THERAPY PATCHES) MISC, , Disp: , Rfl:  .  ibuprofen (ADVIL,MOTRIN) 600 MG tablet, Take 600 mg by mouth., Disp: , Rfl:  .  loratadine (CLARITIN) 10 MG tablet, Take by mouth., Disp: , Rfl:  .  magnesium 30 MG tablet, Take 30 mg by mouth 2 (two) times daily., Disp: , Rfl:  .  metoprolol succinate (TOPROL-XL) 25 MG 24 hr tablet, TAKE 1 TABLET EVERY DAY, Disp: 90 tablet, Rfl: 3 .  MULTIPLE VITAMINS-MINERALS ER PO, Take by mouth., Disp: , Rfl:  .  NAPROXEN 375 MG TBEC EC tablet, , Disp: , Rfl:  .  omeprazole (PRILOSEC) 20 MG capsule, Take 20 mg by mouth., Disp: , Rfl:  .  simvastatin (ZOCOR) 10 MG tablet, Take 1 tablet (10 mg total) by  mouth daily., Disp: 90 tablet, Rfl: 2 .  Sunscreens (RAY BLOCK SUNSCREEN) 3-7 % LOTN, , Disp: , Rfl:  .  tiZANidine (ZANAFLEX) 4 MG tablet, , Disp: , Rfl:  .  Triamcinolone Acetonide (NASACORT ALLERGY 24HR NA), , Disp: , Rfl:   Allergies  Allergen Reactions  . Codeine Other (See Comments) and Itching    nightmare    ROS  Positive for fatigue, nasal congestion, sinus pressure, ear fullness, cough as mentioned in HPI, otherwise all systems reviewed and are negative.  Objective  Filed Vitals:   12/09/15 1052  BP: 102/66  Pulse: 82  Temp: 98.1 F (36.7 C)  TempSrc: Oral  Resp: 16  Weight: 140 lb 4.8 oz (63.64 kg)  SpO2: 97%   Body  mass index is 24.86 kg/(m^2).   Physical Exam  Constitutional: Patient appears well-developed and well-nourished. In no acute distress but does appear to be fatigued from acute illness. HEENT:  - Head: Normocephalic and atraumatic.  - Ears: RIGHT TM bulging with minimal clear exudate, LEFT TM bulging with minimal clear exudate.  - Nose: Nasal mucosa boggy and congested.  - Mouth/Throat: Oropharynx is moist with slight erythema of bilateral tonsils without hypertrophy or exudates. Post nasal drainage present.  - Eyes: Conjunctivae clear, EOM movements normal. PERRLA. No scleral icterus.  Neck: Normal range of motion. Neck supple. No JVD present. No thyromegaly present. No local lymphadenopathy. Cardiovascular: Regular rate, regular rhythm with no murmurs heard.  Pulmonary/Chest: Effort normal and breath sounds clear in all lung fields.  Musculoskeletal: Normal range of motion bilateral UE and LE. Right wrist resolving bruise, normal ROM, non tender. Right anterior chest wall normal motion, no bruising.  Skin: Skin is warm and dry. No rash noted. Psychiatric: Patient has a normal mood and affect. Behavior is normal in office today. Judgment and thought content normal in office today.   Assessment & Plan  1. Acute maxillary sinusitis, recurrence not specified Etiologies include initial allergic rhinitis or viral infection progressing to superimposed bacterial infection. Instructed patient on increasing hydration, nasal saline spray, steam inhalation, NSAID if tolerated and not contraindicated. If not already doing so start taking daily anti-histamine and use a steroid nasal spray. If symptoms persist/worsen may consider antibiotic therapy.  - amoxicillin-clavulanate (AUGMENTIN) 875-125 MG tablet; Take 1 tablet by mouth 2 (two) times daily.  Dispense: 20 tablet; Refill: 0  2. Accidental fall Right wrist bruise resolving.

## 2015-12-21 ENCOUNTER — Other Ambulatory Visit: Payer: Self-pay | Admitting: Family Medicine

## 2016-01-04 ENCOUNTER — Other Ambulatory Visit: Payer: Self-pay

## 2016-01-04 DIAGNOSIS — I10 Essential (primary) hypertension: Secondary | ICD-10-CM

## 2016-01-04 MED ORDER — METOPROLOL SUCCINATE ER 25 MG PO TB24: 25.0000 mg | ORAL_TABLET | Freq: Every day | ORAL | Status: DC

## 2016-01-04 NOTE — Telephone Encounter (Signed)
Pt needs refill spending the winter in [place].  Needs medication sent to the medicine shop  [phone]

## 2016-03-15 ENCOUNTER — Ambulatory Visit: Payer: Commercial Managed Care - HMO | Admitting: Family Medicine

## 2016-04-14 ENCOUNTER — Telehealth: Payer: Self-pay | Admitting: Family Medicine

## 2016-04-14 DIAGNOSIS — L918 Other hypertrophic disorders of the skin: Secondary | ICD-10-CM

## 2016-04-14 NOTE — Telephone Encounter (Signed)
Patient has [employer]: last year she saw a dermatologist but cannot remember the clinic name. She is requesting another referral for skin tag removal. She is currently out of town and will not return until end of June but is asking that you set the appointment up for early July. I did inform patient that she may have to see the doctor before referral is placed but i would ask first. She then remarked that she did not want to wait for the appointment when she returned home because the skin tag is irritating her.

## 2016-04-18 DIAGNOSIS — L918 Other hypertrophic disorders of the skin: Secondary | ICD-10-CM | POA: Insufficient documentation

## 2016-04-18 NOTE — Telephone Encounter (Signed)
Referral entered  

## 2016-05-23 ENCOUNTER — Encounter: Payer: Commercial Managed Care - HMO | Admitting: Family Medicine

## 2016-06-16 ENCOUNTER — Telehealth: Payer: Self-pay

## 2016-06-16 ENCOUNTER — Encounter: Payer: Commercial Managed Care - HMO | Admitting: Family Medicine

## 2016-06-16 NOTE — Telephone Encounter (Signed)
Patient states her symptoms have resolved and patient no longer needs referral to [facility].

## 2016-06-20 ENCOUNTER — Other Ambulatory Visit: Payer: Self-pay | Admitting: Family Medicine

## 2016-06-20 DIAGNOSIS — Z1231 Encounter for screening mammogram for malignant neoplasm of breast: Secondary | ICD-10-CM

## 2016-06-28 ENCOUNTER — Encounter: Payer: Commercial Managed Care - HMO | Admitting: Family Medicine

## 2016-06-28 DIAGNOSIS — H04123 Dry eye syndrome of bilateral lacrimal glands: Secondary | ICD-10-CM | POA: Diagnosis not present

## 2016-06-28 DIAGNOSIS — Z0100 Encounter for examination of eyes and vision without abnormal findings: Secondary | ICD-10-CM | POA: Diagnosis not present

## 2016-06-28 DIAGNOSIS — H25813 Combined forms of age-related cataract, bilateral: Secondary | ICD-10-CM | POA: Diagnosis not present

## 2016-06-28 DIAGNOSIS — H524 Presbyopia: Secondary | ICD-10-CM | POA: Diagnosis not present

## 2016-07-07 ENCOUNTER — Ambulatory Visit
Admission: RE | Admit: 2016-07-07 | Discharge: 2016-07-07 | Disposition: A | Discharge status: Home / Self Care | Payer: Commercial Managed Care - HMO | Source: Ambulatory Visit | Attending: Family Medicine | Admitting: Family Medicine

## 2016-07-07 ENCOUNTER — Other Ambulatory Visit: Payer: Self-pay | Admitting: Family Medicine

## 2016-07-07 DIAGNOSIS — Z1231 Encounter for screening mammogram for malignant neoplasm of breast: Secondary | ICD-10-CM | POA: Diagnosis not present

## 2016-07-18 ENCOUNTER — Encounter: Payer: Self-pay | Admitting: Emergency Medicine

## 2016-07-18 ENCOUNTER — Emergency Department
Admission: EM | Admit: 2016-07-18 | Discharge: 2016-07-18 | Disposition: A | Discharge status: Home / Self Care | Payer: Commercial Managed Care - HMO | Attending: Emergency Medicine | Admitting: Emergency Medicine

## 2016-07-18 ENCOUNTER — Emergency Department: Payer: Commercial Managed Care - HMO

## 2016-07-18 DIAGNOSIS — I10 Essential (primary) hypertension: Secondary | ICD-10-CM | POA: Diagnosis not present

## 2016-07-18 DIAGNOSIS — Z79899 Other long term (current) drug therapy: Secondary | ICD-10-CM | POA: Insufficient documentation

## 2016-07-18 DIAGNOSIS — M25511 Pain in right shoulder: Secondary | ICD-10-CM | POA: Diagnosis not present

## 2016-07-18 NOTE — ED Provider Notes (Signed)
[facility] Emergency Department Provider Note ____________________________________________  Time seen: Approximately 9:36 AM  I have reviewed the triage vital signs and the nursing notes.   HISTORY  Chief Complaint Shoulder Injury    HPI Rachel James is a 70 y.o. female, personal history of arthritis and family history significant for heart problems, presents to the emergency department for right scapula pain for approximately 4 hours. Patient reports that she has been moving into a new residence since Thursday. She thought it was just muscluloskeletal pain, but when the pain persisted and was worse with deep breathing, she became concerned of a potential heart issue given her family history. She describes the pain as 4/10 at rest, sharp, and localized to the scapula region. Pain is worsened with movement and deep breathing, peaking at 6/10 pain. Patient has not taken any palliative measures. Patient denies chest pain, shortness of breath, diaphoresis, abdominal pain, vision changes, radiation of pain to other areas, weakness, numbness/tingling in hands or feet, nausea, or vomiting.  Past Medical History:  Diagnosis Date  . Allergy   . Arthritis   . Hypertension     Patient Active Problem List   Diagnosis Date Noted  . Cutaneous skin tags 04/18/2016  . Acute maxillary sinusitis 12/09/2015  . Accidental fall 12/09/2015  . Hyperlipidemia LDL goal <100 09/17/2015  . Need for immunization against influenza 09/17/2015  . Degenerative arthritis of lumbar spine with cord compression 07/16/2015  . Lumbar and sacral osteoarthritis 07/16/2015  . Bilateral change in hearing 05/27/2015  . Spinal stenosis, multilevel 05/27/2015  . GERD (gastroesophageal reflux disease) 05/27/2015  . Chronic radicular low back pain 05/27/2015  . Allergic rhinitis 01/06/2014  . Osteoarthrosis involving multiple sites 01/06/2014  . Arthropathia 01/06/2014  . Hypertension goal BP  (blood pressure) < 140/90 01/06/2014    Past Surgical History:  Procedure Laterality Date  .  THUMB SURGERY Left   . BREAST LUMPECTOMY Right   . BUNIONECTOMY Bilateral   . CERVICAL DISCECTOMY  09/18/2012   C4-C5 AND C6-C7 ACDF   . TUBAL LIGATION Bilateral     Prior to Admission medications   Medication Sig Start Date End Date Taking? Authorizing Provider  ALPRAZolam Duanne Moron) 0.5 MG tablet  03/21/15   Historical Provider, MD  amoxicillin-clavulanate (AUGMENTIN) 875-125 MG tablet Take 1 tablet by mouth 2 (two) times daily. 12/09/15   Bobetta Lime, MD  benzonatate (TESSALON) 200 MG capsule Take 1 capsule (200 mg total) by mouth 3 (three) times daily as needed for cough. 11/26/15   Bobetta Lime, MD  Cetirizine HCl 10 MG CAPS  03/16/15   Historical Provider, MD  chlorthalidone (HYGROTON) 25 MG tablet TAKE 1/2 TABLET EVERY DAY 10/16/15   Bobetta Lime, MD  diclofenac (VOLTAREN) 75 MG EC tablet  07/27/15   Historical Provider, MD  fexofenadine-pseudoephedrine (ALLEGRA-D 24) 180-240 MG per 24 hr tablet  05/08/15   Historical Provider, MD  Fish Oil OIL by Does not apply route.    Historical Provider, MD  Heat Wraps (HEAT THERAPY PATCHES) [place]  05/25/15   Historical Provider, MD  ibuprofen (ADVIL,MOTRIN) 600 MG tablet Take 600 mg by mouth. 07/13/14   Historical Provider, MD  loratadine (CLARITIN) 10 MG tablet Take by mouth.    Historical Provider, MD  magnesium 30 MG tablet Take 30 mg by mouth 2 (two) times daily.    Historical Provider, MD  metoprolol succinate (TOPROL-XL) 25 MG 24 hr tablet Take 1 tablet (25 mg total) by mouth daily. 01/04/16  Bobetta Lime, MD  MULTIPLE VITAMINS-MINERALS ER PO Take by mouth.    Historical Provider, MD  NAPROXEN 375 MG TBEC EC tablet  05/25/15   Historical Provider, MD  omeprazole (PRILOSEC) 20 MG capsule TAKE 1 CAPSULE EVERY DAY 12/21/15   Bobetta Lime, MD  simvastatin (ZOCOR) 10 MG tablet Take 1 tablet (10 mg total) by mouth daily. 09/17/15   Bobetta Lime, MD   Sunscreens (RAY BLOCK SUNSCREEN) 3-7 % LOTN  05/25/15   Historical Provider, MD  tiZANidine (ZANAFLEX) 4 MG tablet  07/09/15   Historical Provider, MD  Triamcinolone Acetonide (NASACORT ALLERGY 24HR NA)  09/09/15   Historical Provider, MD    Allergies Codeine  Family History  Problem Relation Age of Onset  . Heart disease Mother   . Heart disease Father   . Heart disease Sister   . Heart disease Brother     Social History Social History  Substance Use Topics  . Smoking status: Never Smoker  . Smokeless tobacco: Never Used  . Alcohol use No    Review of Systems Constitutional: No recent illness. Cardiovascular: Denies chest pain or palpitations. No diaphoresis.  Respiratory: Denies shortness of breath. Musculoskeletal: Pain in right scapular region worsened with deep breathing and movement.  Skin: Negative for rash, wound, lesion.  Neurological: Negative for focal weakness or numbness. Negative for headache, dizziness.   ____________________________________________   PHYSICAL EXAM:  VITAL SIGNS: ED Triage Vitals  Enc Vitals Group     BP 07/18/16 0859 135/69     Pulse Rate 07/18/16 0859 69     Resp 07/18/16 0859 18     Temp 07/18/16 0859 98.2 F (36.8 C)     Temp Source 07/18/16 0859 Oral     SpO2 07/18/16 0859 97 %     Weight 07/18/16 0859 135 lb (61.2 kg)     Height 07/18/16 0859 5\' 2"  (1.575 m)     Head Circumference --      Peak Flow --      Pain Score 07/18/16 0853 8     Pain Loc --      Pain Edu? --      Excl. in [place]? --     Constitutional: Alert and oriented. Well appearing and in no acute distress. Eyes: Conjunctivae are normal. EOMI. Head: Atraumatic. Neck: No stridor. No cervical neck tenderness to palpation. Respiratory: Normal respiratory effort. No retractions. Lung sounds equal in all fields, CTAB.   Musculoskeletal: Tenderness to right scapular region on palpation and adduction of right arm. Full ROM of shoulder, no crepitus.  Neurologic:   Normal speech and language. No gross focal neurologic deficits are appreciated. Speech is normal. No gait instability. Skin:  Skin is warm, dry and intact. Atraumatic. Psychiatric: Mood and affect are normal. Speech and behavior are normal.  ____________________________________________   LABS (all labs ordered are listed, but only abnormal results are displayed)  Labs Reviewed - No data to display ____________________________________________  RADIOLOGY  Patient refused ____________________________________________   PROCEDURES  Procedure(s) performed: None   ____________________________________________   INITIAL IMPRESSION / ASSESSMENT AND PLAN / ED COURSE  Pertinent labs & imaging results that were available during my care of the patient were reviewed by me and considered in my medical decision making (see chart for details).  Patient was advised to take her home pain medications as prescribed. She was advised to follow up with her PCP for any symptom that is not improving over the next few days. She was advised to  return to the ER for symptoms that change or worsen if unable to schedule an appointment. ____________________________________________   FINAL CLINICAL IMPRESSION(S) / ED DIAGNOSES  Final diagnoses:  Shoulder pain, acute, right       Victorino Dike, FNP 07/18/16 [place], MD 07/18/16 [phone]

## 2016-07-18 NOTE — ED Triage Notes (Signed)
Reports moving into a new house yesterday, today has right shoulder pain.  Worse with movement.

## 2016-07-19 ENCOUNTER — Telehealth: Payer: Self-pay | Admitting: Family Medicine

## 2016-07-19 DIAGNOSIS — E785 Hyperlipidemia, unspecified: Secondary | ICD-10-CM

## 2016-07-19 DIAGNOSIS — Z5181 Encounter for therapeutic drug level monitoring: Secondary | ICD-10-CM

## 2016-07-19 DIAGNOSIS — Z1159 Encounter for screening for other viral diseases: Secondary | ICD-10-CM

## 2016-07-20 DIAGNOSIS — Z5181 Encounter for therapeutic drug level monitoring: Secondary | ICD-10-CM | POA: Insufficient documentation

## 2016-07-20 DIAGNOSIS — Z1159 Encounter for screening for other viral diseases: Secondary | ICD-10-CM | POA: Insufficient documentation

## 2016-07-20 NOTE — Assessment & Plan Note (Signed)
Check lipids 

## 2016-07-20 NOTE — Assessment & Plan Note (Signed)
Check sgpt and creatinine

## 2016-07-20 NOTE — Telephone Encounter (Signed)
Orders entered

## 2016-07-20 NOTE — Assessment & Plan Note (Signed)
Check hep C Ab at pt's request

## 2016-08-16 ENCOUNTER — Ambulatory Visit (INDEPENDENT_AMBULATORY_CARE_PROVIDER_SITE_OTHER): Payer: Commercial Managed Care - HMO | Admitting: Family Medicine

## 2016-08-16 VITALS — BP 110/72 | HR 77 | Temp 98.4°F | Wt 137.0 lb

## 2016-08-16 DIAGNOSIS — Z5181 Encounter for therapeutic drug level monitoring: Secondary | ICD-10-CM | POA: Diagnosis not present

## 2016-08-16 DIAGNOSIS — E785 Hyperlipidemia, unspecified: Secondary | ICD-10-CM | POA: Diagnosis not present

## 2016-08-16 DIAGNOSIS — Z23 Encounter for immunization: Secondary | ICD-10-CM

## 2016-08-16 DIAGNOSIS — R350 Frequency of micturition: Secondary | ICD-10-CM | POA: Diagnosis not present

## 2016-08-16 DIAGNOSIS — Z113 Encounter for screening for infections with a predominantly sexual mode of transmission: Secondary | ICD-10-CM | POA: Insufficient documentation

## 2016-08-16 DIAGNOSIS — Z0000 Encounter for general adult medical examination without abnormal findings: Secondary | ICD-10-CM

## 2016-08-16 DIAGNOSIS — Z1159 Encounter for screening for other viral diseases: Secondary | ICD-10-CM | POA: Diagnosis not present

## 2016-08-16 NOTE — Assessment & Plan Note (Addendum)
Check urine today with reflux culture; will also check glucose to r/o diabetes as etiology

## 2016-08-16 NOTE — Patient Instructions (Addendum)
Take 1,000 iu daily of vitamin D3 once a day (average) Too much vitamin D can be harmful  Consider coated baby 81 mg aspirin, take at least one hour BEFORE the diclofenac  I invite you back for a physical if that's something covered by your insurance and you desire  Return in the next few weeks for multiple medical issues and medicine  Health Maintenance, Female Adopting a healthy lifestyle and getting preventive care can go a long way to promote health and wellness. Talk with your health care provider about what schedule of regular examinations is right for you. This is a good chance for you to check in with your provider about disease prevention and staying healthy. In between checkups, there are plenty of things you can do on your own. Experts have done a lot of research about which lifestyle changes and preventive measures are most likely to keep you healthy. Ask your health care provider for more information. WEIGHT AND DIET  Eat a healthy diet  Be sure to include plenty of vegetables, fruits, low-fat dairy products, and lean protein.  Do not eat a lot of foods high in solid fats, added sugars, or salt.  Get regular exercise. This is one of the most important things you can do for your health.  Most adults should exercise for at least 150 minutes each week. The exercise should increase your heart rate and make you sweat (moderate-intensity exercise).  Most adults should also do strengthening exercises at least twice a week. This is in addition to the moderate-intensity exercise.  Maintain a healthy weight  Body mass index (BMI) is a measurement that can be used to identify possible weight problems. It estimates body fat based on height and weight. Your health care provider can help determine your BMI and help you achieve or maintain a healthy weight.  For females 92 years of age and older:   A BMI below 18.5 is considered underweight.  A BMI of 18.5 to 24.9 is normal.  A BMI  of 25 to 29.9 is considered overweight.  A BMI of 30 and above is considered obese.  Watch levels of cholesterol and blood lipids  You should start having your blood tested for lipids and cholesterol at 68 years of age, then have this test every 5 years.  You may need to have your cholesterol levels checked more often if:  Your lipid or cholesterol levels are high.  You are older than 68 years of age.  You are at high risk for heart disease.  CANCER SCREENING   Lung Cancer  Lung cancer screening is recommended for adults 26-62 years old who are at high risk for lung cancer because of a history of smoking.  A yearly low-dose CT scan of the lungs is recommended for people who:  Currently smoke.  Have quit within the past 15 years.  Have at least a 30-pack-year history of smoking. A pack year is smoking an average of one pack of cigarettes a day for 1 year.  Yearly screening should continue until it has been 15 years since you quit.  Yearly screening should stop if you develop a health problem that would prevent you from having lung cancer treatment.  Breast Cancer  Practice breast self-awareness. This means understanding how your breasts normally appear and feel.  It also means doing regular breast self-exams. Let your health care provider know about any changes, no matter how small.  If you are in your 20s or 30s, you  should have a clinical breast exam (CBE) by a health care provider every 1-3 years as part of a regular health exam.  If you are 48 or older, have a CBE every year. Also consider having a breast X-ray (mammogram) every year.  If you have a family history of breast cancer, talk to your health care provider about genetic screening.  If you are at high risk for breast cancer, talk to your health care provider about having an MRI and a mammogram every year.  Breast cancer gene (BRCA) assessment is recommended for women who have family members with  BRCA-related cancers. BRCA-related cancers include:  Breast.  Ovarian.  Tubal.  Peritoneal cancers.  Results of the assessment will determine the need for genetic counseling and BRCA1 and BRCA2 testing. Cervical Cancer Your health care provider may recommend that you be screened regularly for cancer of the pelvic organs (ovaries, uterus, and vagina). This screening involves a pelvic examination, including checking for microscopic changes to the surface of your cervix (Pap test). You may be encouraged to have this screening done every 3 years, beginning at age 92.  For women ages 36-65, health care providers may recommend pelvic exams and Pap testing every 3 years, or they may recommend the Pap and pelvic exam, combined with testing for human papilloma virus (HPV), every 5 years. Some types of HPV increase your risk of cervical cancer. Testing for HPV may also be done on women of any age with unclear Pap test results.  Other health care providers may not recommend any screening for nonpregnant women who are considered low risk for pelvic cancer and who do not have symptoms. Ask your health care provider if a screening pelvic exam is right for you.  If you have had past treatment for cervical cancer or a condition that could lead to cancer, you need Pap tests and screening for cancer for at least 20 years after your treatment. If Pap tests have been discontinued, your risk factors (such as having a new sexual partner) need to be reassessed to determine if screening should resume. Some women have medical problems that increase the chance of getting cervical cancer. In these cases, your health care provider may recommend more frequent screening and Pap tests. Colorectal Cancer  This type of cancer can be detected and often prevented.  Routine colorectal cancer screening usually begins at 68 years of age and continues through 68 years of age.  Your health care provider may recommend screening at  an earlier age if you have risk factors for colon cancer.  Your health care provider may also recommend using home test kits to check for hidden blood in the stool.  A small camera at the end of a tube can be used to examine your colon directly (sigmoidoscopy or colonoscopy). This is done to check for the earliest forms of colorectal cancer.  Routine screening usually begins at age 21.  Direct examination of the colon should be repeated every 5-10 years through 68 years of age. However, you may need to be screened more often if early forms of precancerous polyps or small growths are found. Skin Cancer  Check your skin from head to toe regularly.  Tell your health care provider about any new moles or changes in moles, especially if there is a change in a mole's shape or color.  Also tell your health care provider if you have a mole that is larger than the size of a pencil eraser.  Always use sunscreen.  Apply sunscreen liberally and repeatedly throughout the day.  Protect yourself by wearing long sleeves, pants, a wide-brimmed hat, and sunglasses whenever you are outside. HEART DISEASE, DIABETES, AND HIGH BLOOD PRESSURE   High blood pressure causes heart disease and increases the risk of stroke. High blood pressure is more likely to develop in:  People who have blood pressure in the high end of the normal range (130-139/85-89 mm Hg).  People who are overweight or obese.  People who are African American.  If you are 6-1 years of age, have your blood pressure checked every 3-5 years. If you are 57 years of age or older, have your blood pressure checked every year. You should have your blood pressure measured twice--once when you are at a hospital or clinic, and once when you are not at a hospital or clinic. Record the average of the two measurements. To check your blood pressure when you are not at a hospital or clinic, you can use:  An automated blood pressure machine at a  pharmacy.  A home blood pressure monitor.  If you are between 68 years and 38 years old, ask your health care provider if you should take aspirin to prevent strokes.  Have regular diabetes screenings. This involves taking a blood sample to check your fasting blood sugar level.  If you are at a normal weight and have a low risk for diabetes, have this test once every three years after 68 years of age.  If you are overweight and have a high risk for diabetes, consider being tested at a younger age or more often. PREVENTING INFECTION  Hepatitis B  If you have a higher risk for hepatitis B, you should be screened for this virus. You are considered at high risk for hepatitis B if:  You were born in a country where hepatitis B is common. Ask your health care provider which countries are considered high risk.  Your parents were born in a high-risk country, and you have not been immunized against hepatitis B (hepatitis B vaccine).  You have HIV or AIDS.  You use needles to inject street drugs.  You live with someone who has hepatitis B.  You have had sex with someone who has hepatitis B.  You get hemodialysis treatment.  You take certain medicines for conditions, including cancer, organ transplantation, and autoimmune conditions. Hepatitis C  Blood testing is recommended for:  Everyone born from [birth year] through 1965.  Anyone with known risk factors for hepatitis C. Sexually transmitted infections (STIs)  You should be screened for sexually transmitted infections (STIs) including gonorrhea and chlamydia if:  You are sexually active and are younger than 68 years of age.  You are older than 68 years of age and your health care provider tells you that you are at risk for this type of infection.  Your sexual activity has changed since you were last screened and you are at an increased risk for chlamydia or gonorrhea. Ask your health care provider if you are at risk.  If you do not  have HIV, but are at risk, it may be recommended that you take a prescription medicine daily to prevent HIV infection. This is called pre-exposure prophylaxis (PrEP). You are considered at risk if:  You are sexually active and do not regularly use condoms or know the HIV status of your partner(s).  You take drugs by injection.  You are sexually active with a partner who has HIV. Talk with your health care provider about  whether you are at high risk of being infected with HIV. If you choose to begin PrEP, you should first be tested for HIV. You should then be tested every 3 months for as long as you are taking PrEP.  PREGNANCY   If you are premenopausal and you may become pregnant, ask your health care provider about preconception counseling.  If you may become pregnant, take 400 to 800 micrograms (mcg) of folic acid every day.  If you want to prevent pregnancy, talk to your health care provider about birth control (contraception). OSTEOPOROSIS AND MENOPAUSE   Osteoporosis is a disease in which the bones lose minerals and strength with aging. This can result in serious bone fractures. Your risk for osteoporosis can be identified using a bone density scan.  If you are 38 years of age or older, or if you are at risk for osteoporosis and fractures, ask your health care provider if you should be screened.  Ask your health care provider whether you should take a calcium or vitamin D supplement to lower your risk for osteoporosis.  Menopause may have certain physical symptoms and risks.  Hormone replacement therapy may reduce some of these symptoms and risks. Talk to your health care provider about whether hormone replacement therapy is right for you.  HOME CARE INSTRUCTIONS   Schedule regular health, dental, and eye exams.  Stay current with your immunizations.   Do not use any tobacco products including cigarettes, chewing tobacco, or electronic cigarettes.  If you are pregnant, do not  drink alcohol.  If you are breastfeeding, limit how much and how often you drink alcohol.  Limit alcohol intake to no more than 1 drink per day for nonpregnant women. One drink equals 12 ounces of beer, 5 ounces of wine, or 1 ounces of hard liquor.  Do not use street drugs.  Do not share needles.  Ask your health care provider for help if you need support or information about quitting drugs.  Tell your health care provider if you often feel depressed.  Tell your health care provider if you have ever been abused or do not feel safe at home.   This information is not intended to replace advice given to you by your health care provider. Make sure you discuss any questions you have with your health care provider.   Document Released: 06/20/2011 Document Revised: 12/26/2014 Document Reviewed: 11/06/2013 Elsevier Interactive Patient Education [employer].

## 2016-08-16 NOTE — Assessment & Plan Note (Signed)
Will give patient PPSV-23 (Pneumovax) and high dose flu vaccine today

## 2016-08-16 NOTE — Progress Notes (Signed)
Patient: Rachel James, Female    DOB: [birth year], 70 y.o.   MRN: [id number]  Visit Date: 08/20/2016  Today's Provider: Enid Derry, MD   Chief Complaint  Patient presents with  . Annual Exam    medicare wellness    Subjective:   Rachel James is a 70 y.o. female who presents today for her Subsequent Annual Wellness Visit.  Caregiver input:  None  USPSTF grade A and B recommendations Alcohol: 3 per month Depression:  Depression screen [facility] 2/9 08/16/2016 07/20/2015 05/27/2015  Decreased Interest 0 0 0  Down, Depressed, Hopeless 0 0 0  PHQ - 2 Score 0 0 0   Hypertension: contolled Obesity: no Tobacco use: never use HIV, hep B, hep C: ordered STD testing and prevention (chl/gon/syphilis): check hiv, rpr Lipids: check Glucose: check Colorectal cancer: 2013 Breast cancer: UTD Intimate partner violence: no Cervical cancer screening:  Lung cancer: nonsmoker Osteoporosis: due in 2018 Fall prevention/vitamin D: declined PT; not taking vit D AAA: no Aspirin: not taking; does get bruising though when she took it before; taking diclofenac already BID Diet: downfall is ice cream Exercise: trying to stay active Skin cancer: no worrisome moles; had bump on face, right cheek, picking at it; using exfoliating stuff and it went away   HPI  Review of Systems  Past Medical History:  Diagnosis Date  . Allergy   . Arthritis   . Hypertension    Past Surgical History:  Procedure Laterality Date  .  THUMB SURGERY Left   . BREAST LUMPECTOMY Right   . BUNIONECTOMY Bilateral   . CERVICAL DISCECTOMY  09/18/2012   C4-C5 AND C6-C7 ACDF   . TUBAL LIGATION Bilateral    Family History  Problem Relation Age of Onset  . Heart disease Mother   . Heart disease Father   . Heart disease Sister   . Heart disease Brother     Social History   Social History  . Marital status: Unknown    Spouse name: N/A  . Number of children: N/A  . Years of education: N/A   Occupational History  .  retired    Social History Main Topics  . Smoking status: Never Smoker  . Smokeless tobacco: Never Used  . Alcohol use No  . Drug use: No  . Sexual activity: Not Currently   Other Topics Concern  . Not on file   Social History Narrative  . No narrative on file    Outpatient Encounter Prescriptions as of 08/16/2016  Medication Sig Note  . calcium carbonate 1250 MG capsule Take 1,250 mg by mouth 2 (two) times daily with a meal.   . ALPRAZolam (XANAX) 0.5 MG tablet  05/27/2015: Received from: External Pharmacy  . Cetirizine HCl 10 MG CAPS  05/27/2015: Received from: External Pharmacy  . chlorthalidone (HYGROTON) 25 MG tablet TAKE 1/2 TABLET EVERY DAY   . diclofenac (VOLTAREN) 75 MG EC tablet  09/17/2015: Received from: External Pharmacy  . Fish Oil OIL by Does not apply route.   Wilfred Lacy Wraps (HEAT THERAPY PATCHES) [place]  05/27/2015: Received from: External Pharmacy  . magnesium 30 MG tablet Take 30 mg by mouth 2 (two) times daily.   . metoprolol succinate (TOPROL-XL) 25 MG 24 hr tablet Take 1 tablet (25 mg total) by mouth daily.   . MULTIPLE VITAMINS-MINERALS ER PO Take by mouth. 05/27/2015: Received from: [place]  . omeprazole (PRILOSEC) 20 MG capsule TAKE 1 CAPSULE EVERY DAY   . simvastatin (ZOCOR) 10  MG tablet Take 1 tablet (10 mg total) by mouth daily.   . Sunscreens (RAY BLOCK SUNSCREEN) 3-7 % LOTN  05/27/2015: Received from: External Pharmacy  . tiZANidine (ZANAFLEX) 4 MG tablet  07/20/2015: Received from: External Pharmacy  . [DISCONTINUED] benzonatate (TESSALON) 200 MG capsule Take 1 capsule (200 mg total) by mouth 3 (three) times daily as needed for cough.   . [DISCONTINUED] fexofenadine-pseudoephedrine (ALLEGRA-D 24) 180-240 MG per 24 hr tablet  05/27/2015: Received from: External Pharmacy  . [DISCONTINUED] ibuprofen (ADVIL,MOTRIN) 600 MG tablet Take 600 mg by mouth. 05/27/2015: Received from: [name]  . [DISCONTINUED] loratadine (CLARITIN) 10 MG tablet Take by mouth. 05/27/2015:  Received from: [place]  . [DISCONTINUED] NAPROXEN 375 MG TBEC EC tablet  05/27/2015: Received from: External Pharmacy  . [DISCONTINUED] Triamcinolone Acetonide (NASACORT ALLERGY 24HR NA)  09/17/2015: Received from: External Pharmacy   No facility-administered encounter medications on file as of 08/16/2016.     Functional Ability / Safety Screening 1.  Was the timed Get Up and Go test longer than 30 seconds?  no 2.  Does the patient need help with the phone, transportation, shopping,      preparing meals, housework, laundry, medications, or managing money?  no 3.  Does the patient's home have:  loose throw rugs in the hallway?   yes, rug in hallway nailed to the floor, think about that      Grab bars in the bathroom? yes      Handrails on the stairs?   no; five steps to front door; no handrails      Poor lighting?   no 4.  Has the patient noticed any hearing difficulties?   yes , some, hard with people who mumble, just starting  Fall Risk Assessment See under rooming  Depression Screen See under rooming Depression screen [facility] 2/9 08/16/2016 07/20/2015 05/27/2015  Decreased Interest 0 0 0  Down, Depressed, Hopeless 0 0 0  PHQ - 2 Score 0 0 0    Advanced Directives Does patient have a HCPOA?    no If yes, name and contact information:  Does patient have a living will or MOST form?  yes, full code but doesn't want to be left alive on machines if no hope  Objective:   Vitals: BP 110/72   Pulse 77   Temp 98.4 F (36.9 C)   Wt 137 lb (62.1 kg)   SpO2 96%   BMI 25.06 kg/m  Body mass index is 25.06 kg/m. No exam data present  Physical Exam Mood/affect:  Euthymic Appearance:  Casually dressed  Cognitive Testing - 6-CIT  Correct? Score   What year is it? yes 0 Yes = 0    No = 4  What month is it? yes 0 Yes = 0    No = 3  Remember:     [place], [place], [place]     What time is it? yes 0 Yes = 0    No = 3  Count backwards from 20 to 1 yes 0 Correct = 0    1 error = 2    More than 1 error = 4  Say the months of the year in reverse. yes 0 Correct = 0    1 error = 2   More than 1 error = 4  What address did I ask you to remember? yes 0 Correct = 0  1 error = 2    2 error = 4  3 error = 6    4 error = 8    All wrong = 10       TOTAL SCORE  0/28   Interpretation:  Normal  Normal (0-7) Abnormal (8-28)    Assessment & Plan:     Annual Wellness Visit  Reviewed patient's Family Medical History Reviewed and updated list of patient's medical providers Assessment of cognitive impairment was done Assessed patient's functional ability Established a written schedule for health screening [place] Completed and Reviewed  Exercise Activities and Dietary recommendations Goals    None      Immunization History  Administered Date(s) Administered  . Influenza, High Dose Seasonal PF 09/17/2015, 08/16/2016  . Pneumococcal Conjugate-13 04/21/2015  . Pneumococcal Polysaccharide-23 08/16/2016    Health Maintenance  Topic Date Due  . Hepatitis C Screening  06/25/1946  . MAMMOGRAM  07/07/2018  . COLONOSCOPY  01/19/2022  . TETANUS/TDAP  07/12/2024  . INFLUENZA VACCINE  Completed  . DEXA SCAN  Completed  . ZOSTAVAX  Addressed  . PNA vac Low Risk Adult  Completed     Discussed health benefits of physical activity, and encouraged her to engage in regular exercise appropriate for her age and condition.   Meds ordered this encounter  Medications  . calcium carbonate 1250 MG capsule    Sig: Take 1,250 mg by mouth 2 (two) times daily with a meal.    Current Outpatient Prescriptions:  .  calcium carbonate 1250 MG capsule, Take 1,250 mg by mouth 2 (two) times daily with a meal., Disp: , Rfl:  .  ALPRAZolam (XANAX) 0.5 MG tablet, , Disp: , Rfl:  .  Cetirizine HCl 10 MG CAPS, , Disp: , Rfl:  .  chlorthalidone (HYGROTON) 25 MG tablet, TAKE 1/2 TABLET EVERY DAY, Disp: 45 tablet, Rfl: 3 .  diclofenac (VOLTAREN) 75 MG EC tablet, , Disp: ,  Rfl:  .  Fish Oil OIL, by Does not apply route., Disp: , Rfl:  .  Heat Wraps (HEAT THERAPY PATCHES) MISC, , Disp: , Rfl:  .  magnesium 30 MG tablet, Take 30 mg by mouth 2 (two) times daily., Disp: , Rfl:  .  metoprolol succinate (TOPROL-XL) 25 MG 24 hr tablet, Take 1 tablet (25 mg total) by mouth daily., Disp: 90 tablet, Rfl: 1 .  MULTIPLE VITAMINS-MINERALS ER PO, Take by mouth., Disp: , Rfl:  .  omeprazole (PRILOSEC) 20 MG capsule, TAKE 1 CAPSULE EVERY DAY, Disp: 90 capsule, Rfl: 1 .  simvastatin (ZOCOR) 10 MG tablet, Take 1 tablet (10 mg total) by mouth daily., Disp: 90 tablet, Rfl: 2 .  Sunscreens (RAY BLOCK SUNSCREEN) 3-7 % LOTN, , Disp: , Rfl:  .  tiZANidine (ZANAFLEX) 4 MG tablet, , Disp: , Rfl:  Medications Discontinued During This Encounter  Medication Reason  . Triamcinolone Acetonide (NASACORT ALLERGY 24HR NA)   . NAPROXEN 375 MG TBEC EC tablet   . loratadine (CLARITIN) 10 MG tablet   . ibuprofen (ADVIL,MOTRIN) 600 MG tablet   . fexofenadine-pseudoephedrine (ALLEGRA-D 24) 180-240 MG per 24 hr tablet   . benzonatate (TESSALON) 200 MG capsule    Problem List Items Addressed This Visit      Other   Urinary frequency    Check urine today with reflux culture; will also check glucose to r/o diabetes as etiology      Relevant Orders   UA/M w/rflx Culture, Routine (Completed)   Screen for STD (sexually transmitted disease)    Offered testing; pt  agreed to testing for hiv and rpr and hepatitis      Relevant Orders   HIV antibody   RPR   Preventative health care - Primary    USPSTF grade A and B recommendations reviewed with patient; age-appropriate recommendations, preventive care, screening tests, etc discussed and encouraged; healthy living encouraged; see AVS for patient education given to patient      Need for prophylactic vaccination with Streptococcus pneumoniae (Pneumococcus) and Influenza vaccines    Will give patient PPSV-23 (Pneumovax) and high dose flu vaccine  today      Relevant Orders   Pneumococcal polysaccharide vaccine 23-valent greater than or equal to 2yo subcutaneous/IM (Completed)   Need for hepatitis C screening test    Discussed one-time hep C screening recommendation for individuals born between 1945-1965 per USPSTF guidelines; patient agrees with testing; Hep C Ab ordered      Relevant Orders   Hepatitis C Antibody   Medication monitoring encounter    On the NSAID, check Cr and CBC; on the statin, check SGPT      Hyperlipidemia LDL goal <100    Continue statin; check lipids and sgpt on statin       Other Visit Diagnoses    Needs flu shot       Relevant Orders   Flu vaccine HIGH DOSE PF (Fluzone High dose) (Completed)     Orders Placed This Encounter  Procedures  . Microscopic Examination  . Flu vaccine HIGH DOSE PF (Fluzone High dose)  . Pneumococcal polysaccharide vaccine 23-valent greater than or equal to 2yo subcutaneous/IM  . Hepatitis C Antibody  . HIV antibody  . RPR  . UA/M w/rflx Culture, Routine   Next Medicare Wellness Visit in 12+ months

## 2016-08-17 LAB — UA/M W/RFLX CULTURE, ROUTINE
Bilirubin, UA: NEGATIVE
Glucose, UA: NEGATIVE
KETONES UA: NEGATIVE
LEUKOCYTES UA: NEGATIVE
NITRITE UA: NEGATIVE
PH UA: 6.5 (ref 5.0–7.5)
Protein, UA: NEGATIVE
RBC UA: NEGATIVE
Specific Gravity, UA: 1.014 (ref 1.005–1.030)
UUROB: 0.2 mg/dL (ref 0.2–1.0)

## 2016-08-17 LAB — MICROSCOPIC EXAMINATION
Bacteria, UA: NONE SEEN
CASTS: NONE SEEN /LPF
EPITHELIAL CELLS (NON RENAL): NONE SEEN /HPF (ref 0–10)

## 2016-08-20 ENCOUNTER — Encounter: Payer: Self-pay | Admitting: Family Medicine

## 2016-08-20 NOTE — Assessment & Plan Note (Signed)
Discussed one-time hep C screening recommendation for individuals born between 1945-1965 per USPSTF guidelines; patient agrees with testing; Hep C Ab ordered 

## 2016-08-20 NOTE — Assessment & Plan Note (Signed)
Offered testing; pt agreed to testing for hiv and rpr and hepatitis

## 2016-08-20 NOTE — Assessment & Plan Note (Signed)
On the NSAID, check Cr and CBC; on the statin, check SGPT

## 2016-08-20 NOTE — Assessment & Plan Note (Signed)
USPSTF grade A and B recommendations reviewed with patient; age-appropriate recommendations, preventive care, screening tests, etc discussed and encouraged; healthy living encouraged; see AVS for patient education given to patient  

## 2016-08-20 NOTE — Assessment & Plan Note (Signed)
Continue statin; check lipids and sgpt on statin

## 2016-08-23 ENCOUNTER — Other Ambulatory Visit: Payer: Self-pay | Admitting: Family Medicine

## 2016-08-23 DIAGNOSIS — Z113 Encounter for screening for infections with a predominantly sexual mode of transmission: Secondary | ICD-10-CM | POA: Diagnosis not present

## 2016-08-23 DIAGNOSIS — Z1159 Encounter for screening for other viral diseases: Secondary | ICD-10-CM | POA: Diagnosis not present

## 2016-08-23 DIAGNOSIS — Z5181 Encounter for therapeutic drug level monitoring: Secondary | ICD-10-CM | POA: Diagnosis not present

## 2016-08-24 LAB — CBC WITH DIFFERENTIAL/PLATELET
BASOS: 0 %
Basophils Absolute: 0 10*3/uL (ref 0.0–0.2)
EOS (ABSOLUTE): 0.2 10*3/uL (ref 0.0–0.4)
EOS: 4 %
HEMATOCRIT: 34.4 % (ref 34.0–46.6)
HEMOGLOBIN: 12.1 g/dL (ref 11.1–15.9)
Immature Grans (Abs): 0 10*3/uL (ref 0.0–0.1)
Immature Granulocytes: 0 %
LYMPHS ABS: 1.3 10*3/uL (ref 0.7–3.1)
Lymphs: 22 %
MCH: 30.2 pg (ref 26.6–33.0)
MCHC: 35.2 g/dL (ref 31.5–35.7)
MCV: 86 fL (ref 79–97)
MONOCYTES: 9 %
MONOS ABS: 0.5 10*3/uL (ref 0.1–0.9)
Neutrophils Absolute: 3.8 10*3/uL (ref 1.4–7.0)
Neutrophils: 65 %
Platelets: 255 10*3/uL (ref 150–379)
RBC: 4.01 x10E6/uL (ref 3.77–5.28)
RDW: 12.8 % (ref 12.3–15.4)
WBC: 5.9 10*3/uL (ref 3.4–10.8)

## 2016-08-24 LAB — COMPREHENSIVE METABOLIC PANEL
A/G RATIO: 1.4 (ref 1.2–2.2)
ALBUMIN: 4 g/dL (ref 3.6–4.8)
ALK PHOS: 48 IU/L (ref 39–117)
ALT: 22 IU/L (ref 0–32)
AST: 24 IU/L (ref 0–40)
BUN / CREAT RATIO: 26 (ref 12–28)
BUN: 25 mg/dL (ref 8–27)
Bilirubin Total: 0.5 mg/dL (ref 0.0–1.2)
CALCIUM: 9.6 mg/dL (ref 8.7–10.3)
CO2: 24 mmol/L (ref 18–29)
Chloride: 97 mmol/L (ref 96–106)
Creatinine, Ser: 0.95 mg/dL (ref 0.57–1.00)
GFR calc Af Amer: 71 mL/min/{1.73_m2} (ref >59)
GFR, EST NON AFRICAN AMERICAN: 62 mL/min/{1.73_m2} (ref >59)
GLOBULIN, TOTAL: 2.8 g/dL (ref 1.5–4.5)
Glucose: 79 mg/dL (ref 65–99)
POTASSIUM: 4.3 mmol/L (ref 3.5–5.2)
SODIUM: 140 mmol/L (ref 134–144)
Total Protein: 6.8 g/dL (ref 6.0–8.5)

## 2016-08-24 LAB — HEPATITIS C ANTIBODY: Hep C Virus Ab: <0.1 s/co ratio (ref 0.0–0.9)

## 2016-08-24 LAB — RPR: RPR: NONREACTIVE

## 2016-08-24 LAB — LIPID PANEL W/O CHOL/HDL RATIO
Cholesterol, Total: 158 mg/dL (ref 100–199)
HDL: 68 mg/dL (ref >39)
LDL CALC: 75 mg/dL (ref 0–99)
TRIGLYCERIDES: 77 mg/dL (ref 0–149)
VLDL Cholesterol Cal: 15 mg/dL (ref 5–40)

## 2016-08-24 LAB — HIV ANTIBODY (ROUTINE TESTING W REFLEX): HIV SCREEN 4TH GENERATION: NONREACTIVE

## 2016-09-08 ENCOUNTER — Encounter: Payer: Self-pay | Admitting: Family Medicine

## 2016-09-08 ENCOUNTER — Ambulatory Visit (INDEPENDENT_AMBULATORY_CARE_PROVIDER_SITE_OTHER): Payer: Commercial Managed Care - HMO | Admitting: Family Medicine

## 2016-09-08 VITALS — BP 118/72 | HR 86 | Temp 98.3°F | Resp 14 | Wt 136.0 lb

## 2016-09-08 DIAGNOSIS — M25512 Pain in left shoulder: Secondary | ICD-10-CM | POA: Diagnosis not present

## 2016-09-08 DIAGNOSIS — I10 Essential (primary) hypertension: Secondary | ICD-10-CM

## 2016-09-08 DIAGNOSIS — M4800 Spinal stenosis, site unspecified: Secondary | ICD-10-CM

## 2016-09-08 DIAGNOSIS — M4716 Other spondylosis with myelopathy, lumbar region: Secondary | ICD-10-CM

## 2016-09-08 DIAGNOSIS — D485 Neoplasm of uncertain behavior of skin: Secondary | ICD-10-CM | POA: Diagnosis not present

## 2016-09-08 DIAGNOSIS — M25511 Pain in right shoulder: Secondary | ICD-10-CM | POA: Insufficient documentation

## 2016-09-08 DIAGNOSIS — M47817 Spondylosis without myelopathy or radiculopathy, lumbosacral region: Secondary | ICD-10-CM | POA: Diagnosis not present

## 2016-09-08 DIAGNOSIS — E785 Hyperlipidemia, unspecified: Secondary | ICD-10-CM

## 2016-09-08 NOTE — Progress Notes (Signed)
BP 118/72   Pulse 86   Temp 98.3 F (36.8 C) (Oral)   Resp 14   Wt 136 lb (61.7 kg)   SpO2 96%   BMI 24.87 kg/m    Subjective:    Patient ID: Rachel James, female    DOB: [birth year], 70 y.o.   MRN: [id number]  HPI: AMONIE QUEBEDEAUX is a 70 y.o. female  Chief Complaint  Patient presents with  . Follow-up  . Urinary Frequency  . Skin Problem    spot on left side of nose  . Back Pain   Patient is here for f/u; her first question to me was what I thought of a certain calcium supplement she brought in; it's calcium oronate, which looked like each serving provided 12% of daily requirements, so TID dosing would given her 36%; she is only using BID (so 24%) She eats spinach, loves broccoli, [employer], not much kale; drinks almond milk; no tofu; eats a lot of yogurt or cottage cheese She has an expensive calcium oronate supplement and wonders if it's worth it Bone graft and implant with titanium post, invisalign tray then another month  She has been having urinary frequency; the more she drinks, the more she has to urinate; when she gets home, she has to urinate in the worst way; she wears a minipad and sometimes can't make it to the bathroom; no odor to the urine; no blood in the urine  She has a redness on the left side of nose; she tries dermabrasion; there for maybe six months or less; no hx of skin cancer; hx of sun burns  Back pain; she has had issues for a few years; Feb 2015; has degenerative disc, arthritis, spinal stenosis, scoliosis; back does not like water aerobics and line dancing; she has had injections which help for a few weeks; she is interested in chiropractic; spine doctor told her to avoid surgery; taking diclofenac  She is having issues with shoulders; she did a repetitive job; right shoulder worse than left; not sure if swimming making worse; uses jogging belt; right-handed; does crunch  She has high chol, takes statin, had labs done recently  Depression  screen [facility] 2 2/9 08/16/2016 07/20/2015 05/27/2015  Decreased Interest 0 0 0  Down, Depressed, Hopeless 0 0 0  PHQ - 2 Score 0 0 0   Relevant past medical, surgical, family and social history reviewed Past Medical History:  Diagnosis Date  . Allergy   . Arthritis   . Hypertension    Past Surgical History:  Procedure Laterality Date  .  THUMB SURGERY Left   . BREAST LUMPECTOMY Right   . BUNIONECTOMY Bilateral   . CERVICAL DISCECTOMY  09/18/2012   C4-C5 AND C6-C7 ACDF   . TUBAL LIGATION Bilateral    Family History  Problem Relation Age of Onset  . Heart disease Mother   . Heart disease Father   . Heart disease Sister   . Heart disease Brother    Social History  Substance Use Topics  . Smoking status: Never Smoker  . Smokeless tobacco: Never Used  . Alcohol use 0.0 oz/week     Comment: maybe 3 drinks per month   Interim medical history since last visit reviewed. Allergies and medications reviewed  Review of Systems Per HPI unless specifically indicated above     Objective:    BP 118/72   Pulse 86   Temp 98.3 F (36.8 C) (Oral)   Resp 14   Wt  136 lb (61.7 kg)   SpO2 96%   BMI 24.87 kg/m   Wt Readings from Last 3 Encounters:  09/08/16 136 lb (61.7 kg)  08/16/16 137 lb (62.1 kg)  07/18/16 135 lb (61.2 kg)    Physical Exam  Constitutional: She appears well-developed and well-nourished. No distress.  Eyes: EOM are normal. No scleral icterus.  Neck: No thyromegaly present.  Cardiovascular: Normal rate and regular rhythm.   Pulmonary/Chest: Effort normal and breath sounds normal.  Abdominal: Normal appearance. She exhibits no distension.  Musculoskeletal:       Right shoulder: She exhibits decreased range of motion (trouble reaching behind her with right arm). She exhibits no deformity.       Left shoulder: She exhibits no deformity.  Skin: Lesion (erythematous lesion shallow, hint of telangiectasia left side of nose; no rolled borders) noted. No pallor.    Psychiatric: She has a normal mood and affect. Her behavior is normal. Judgment and thought content normal. Her mood appears not anxious.   Results for orders placed or performed in visit on 08/23/16  CBC with Differential/Platelet  Result Value Ref Range   WBC 5.9 3.4 - 10.8 x10E3/uL   RBC 4.01 3.77 - 5.28 x10E6/uL   Hemoglobin 12.1 11.1 - 15.9 g/dL   Hematocrit 34.4 34.0 - 46.6 %   MCV 86 79 - 97 fL   MCH 30.2 26.6 - 33.0 pg   MCHC 35.2 31.5 - 35.7 g/dL   RDW 12.8 12.3 - 15.4 %   Platelets 255 150 - 379 x10E3/uL   Neutrophils 65 %   Lymphs 22 %   Monocytes 9 %   Eos 4 %   Basos 0 %   Neutrophils Absolute 3.8 1.4 - 7.0 x10E3/uL   Lymphocytes Absolute 1.3 0.7 - 3.1 x10E3/uL   Monocytes Absolute 0.5 0.1 - 0.9 x10E3/uL   EOS (ABSOLUTE) 0.2 0.0 - 0.4 x10E3/uL   Basophils Absolute 0.0 0.0 - 0.2 x10E3/uL   Immature Granulocytes 0 %   Immature Grans (Abs) 0.0 0.0 - 0.1 x10E3/uL  Comprehensive metabolic panel  Result Value Ref Range   Glucose 79 65 - 99 mg/dL   BUN 25 8 - 27 mg/dL   Creatinine, Ser 0.95 0.57 - 1.00 mg/dL   GFR calc non Af Amer 62 >59 mL/min/1.73   GFR calc Af Amer 71 >59 mL/min/1.73   BUN/Creatinine Ratio 26 12 - 28   Sodium 140 134 - 144 mmol/L   Potassium 4.3 3.5 - 5.2 mmol/L   Chloride 97 96 - 106 mmol/L   CO2 24 18 - 29 mmol/L   Calcium 9.6 8.7 - 10.3 mg/dL   Total Protein 6.8 6.0 - 8.5 g/dL   Albumin 4.0 3.6 - 4.8 g/dL   Globulin, Total 2.8 1.5 - 4.5 g/dL   Albumin/Globulin Ratio 1.4 1.2 - 2.2   Bilirubin Total 0.5 0.0 - 1.2 mg/dL   Alkaline Phosphatase 48 39 - 117 IU/L   AST 24 0 - 40 IU/L   ALT 22 0 - 32 IU/L  Lipid Panel w/o Chol/HDL Ratio  Result Value Ref Range   Cholesterol, Total 158 100 - 199 mg/dL   Triglycerides 77 0 - 149 mg/dL   HDL 68 >39 mg/dL   VLDL Cholesterol Cal 15 5 - 40 mg/dL   LDL Calculated 75 0 - 99 mg/dL      Assessment & Plan:   Problem List Items Addressed This Visit      Cardiovascular and Mediastinum  Hypertension goal BP (blood pressure) < 140/90 (Chronic)    Controlled today, even with NSAIDs; continue chlorthalidone and beta-blocker        Nervous and Auditory   Degenerative arthritis of lumbar spine with cord compression    Refer to chiropractic      Relevant Orders   Ambulatory referral to Chiropractic     Musculoskeletal and Integument   Neoplasm of uncertain behavior of skin of nose - Primary    Suspicious for AK or early BCC, refer to derm; wear sunscreen      Relevant Orders   Ambulatory referral to Dermatology   Lumbar and sacral osteoarthritis    Refer to chiropractic      Relevant Orders   Ambulatory referral to Chiropractic     Other   Spinal stenosis, multilevel (Chronic)    Refer to chiropractic      Relevant Orders   Ambulatory referral to Chiropractic   Shoulder pain, bilateral    Suspect arthritis with tendonitis; she declined PT but will try online videos and wall walking      Hyperlipidemia LDL goal <100 (Chronic)    Last lipids reviewed; good control; continue statin       Other Visit Diagnoses   None.     Follow up plan: Return in about 5 months (around 02/21/2017) for cholesterol and blood pressure.  An after-visit summary was printed and given to the patient at [facility].  Please see the patient instructions which may contain other information and recommendations beyond what is mentioned above in the assessment and plan.  Meds ordered this encounter  Medications  . Loratadine 10 MG CAPS    Sig: Take 10 mg by mouth daily.    Orders Placed This Encounter  Procedures  . Ambulatory referral to Dermatology  . Ambulatory referral to Chiropractic

## 2016-09-08 NOTE — Patient Instructions (Addendum)
Try to get calcium through your diet Try avoiding all caffeine, tea, and chocolate Try Kegel exercises daily We'll refer you to a dermatologist We'll refer you to see a chiropractor Check out legitimate videos for shoulder exercises, strengthening and stretching  Kegel Exercises The goal of Kegel exercises is to isolate and exercise your pelvic floor muscles. These muscles act as a hammock that supports the rectum, vagina, small intestine, and uterus. As the muscles weaken, the hammock sags and these organs are displaced from their normal positions. Kegel exercises can strengthen your pelvic floor muscles and help you to improve bladder and bowel control, improve sexual response, and help reduce many problems and some discomfort during pregnancy. Kegel exercises can be done anywhere and at any time. HOW TO PERFORM KEGEL EXERCISES 1. Locate your pelvic floor muscles. To do this, squeeze (contract) the muscles that you use when you try to stop the flow of urine. You will feel a tightness in the vaginal area (women) and a tight lift in the rectal area (men and women). 2. When you begin, contract your pelvic muscles tight for 2-5 seconds, then relax them for 2-5 seconds. This is one set. Do 4-5 sets with a short pause in between. 3. Contract your pelvic muscles for 8-10 seconds, then relax them for 8-10 seconds. Do 4-5 sets. If you cannot contract your pelvic muscles for 8-10 seconds, try 5-7 seconds and work your way up to 8-10 seconds. Your goal is 4-5 sets of 10 contractions each day. Keep your stomach, buttocks, and legs relaxed during the exercises. Perform sets of both short and long contractions. Vary your positions. Perform these contractions 3-4 times per day. Perform sets while you are:   Lying in bed in the morning.  Standing at lunch.  Sitting in the late afternoon.  Lying in bed at night. You should do 40-50 contractions per day. Do not perform more Kegel exercises per day than  recommended. Overexercising can cause muscle fatigue. Continue these exercises for for at least 15-20 weeks or as directed by your caregiver.   This information is not intended to replace advice given to you by your health care provider. Make sure you discuss any questions you have with your health care provider.   Document Released: 11/21/2012 Document Revised: 12/26/2014 Document Reviewed: 11/21/2012 Elsevier Interactive Patient Education [employer].

## 2016-09-08 NOTE — Assessment & Plan Note (Signed)
Refer to chiropractic

## 2016-09-08 NOTE — Assessment & Plan Note (Signed)
Suspicious for AK or early BCC, refer to derm; wear sunscreen

## 2016-09-08 NOTE — Assessment & Plan Note (Signed)
Suspect arthritis with tendonitis; she declined PT but will try online videos and wall walking

## 2016-09-08 NOTE — Assessment & Plan Note (Addendum)
Refer to chiropractic

## 2016-09-09 NOTE — Assessment & Plan Note (Signed)
Last lipids reviewed; good control; continue statin

## 2016-09-09 NOTE — Assessment & Plan Note (Signed)
Controlled today, even with NSAIDs; continue chlorthalidone and beta-blocker

## 2016-09-14 DIAGNOSIS — M9905 Segmental and somatic dysfunction of pelvic region: Secondary | ICD-10-CM | POA: Diagnosis not present

## 2016-09-14 DIAGNOSIS — M5417 Radiculopathy, lumbosacral region: Secondary | ICD-10-CM | POA: Diagnosis not present

## 2016-09-14 DIAGNOSIS — M5136 Other intervertebral disc degeneration, lumbar region: Secondary | ICD-10-CM | POA: Diagnosis not present

## 2016-09-14 DIAGNOSIS — M9903 Segmental and somatic dysfunction of lumbar region: Secondary | ICD-10-CM | POA: Diagnosis not present

## 2016-09-15 DIAGNOSIS — M5136 Other intervertebral disc degeneration, lumbar region: Secondary | ICD-10-CM | POA: Diagnosis not present

## 2016-09-15 DIAGNOSIS — M5417 Radiculopathy, lumbosacral region: Secondary | ICD-10-CM | POA: Diagnosis not present

## 2016-09-15 DIAGNOSIS — M9903 Segmental and somatic dysfunction of lumbar region: Secondary | ICD-10-CM | POA: Diagnosis not present

## 2016-09-15 DIAGNOSIS — M9905 Segmental and somatic dysfunction of pelvic region: Secondary | ICD-10-CM | POA: Diagnosis not present

## 2016-09-16 DIAGNOSIS — M5136 Other intervertebral disc degeneration, lumbar region: Secondary | ICD-10-CM | POA: Diagnosis not present

## 2016-09-16 DIAGNOSIS — M9903 Segmental and somatic dysfunction of lumbar region: Secondary | ICD-10-CM | POA: Diagnosis not present

## 2016-09-16 DIAGNOSIS — M9905 Segmental and somatic dysfunction of pelvic region: Secondary | ICD-10-CM | POA: Diagnosis not present

## 2016-09-16 DIAGNOSIS — M5417 Radiculopathy, lumbosacral region: Secondary | ICD-10-CM | POA: Diagnosis not present

## 2016-09-19 DIAGNOSIS — M5136 Other intervertebral disc degeneration, lumbar region: Secondary | ICD-10-CM | POA: Diagnosis not present

## 2016-09-19 DIAGNOSIS — M5417 Radiculopathy, lumbosacral region: Secondary | ICD-10-CM | POA: Diagnosis not present

## 2016-09-19 DIAGNOSIS — M9903 Segmental and somatic dysfunction of lumbar region: Secondary | ICD-10-CM | POA: Diagnosis not present

## 2016-09-19 DIAGNOSIS — M9905 Segmental and somatic dysfunction of pelvic region: Secondary | ICD-10-CM | POA: Diagnosis not present

## 2016-09-21 DIAGNOSIS — M5417 Radiculopathy, lumbosacral region: Secondary | ICD-10-CM | POA: Diagnosis not present

## 2016-09-21 DIAGNOSIS — M9903 Segmental and somatic dysfunction of lumbar region: Secondary | ICD-10-CM | POA: Diagnosis not present

## 2016-09-21 DIAGNOSIS — M5136 Other intervertebral disc degeneration, lumbar region: Secondary | ICD-10-CM | POA: Diagnosis not present

## 2016-09-21 DIAGNOSIS — M9905 Segmental and somatic dysfunction of pelvic region: Secondary | ICD-10-CM | POA: Diagnosis not present

## 2016-09-23 DIAGNOSIS — M5417 Radiculopathy, lumbosacral region: Secondary | ICD-10-CM | POA: Diagnosis not present

## 2016-09-23 DIAGNOSIS — M9905 Segmental and somatic dysfunction of pelvic region: Secondary | ICD-10-CM | POA: Diagnosis not present

## 2016-09-23 DIAGNOSIS — M9903 Segmental and somatic dysfunction of lumbar region: Secondary | ICD-10-CM | POA: Diagnosis not present

## 2016-09-23 DIAGNOSIS — M5136 Other intervertebral disc degeneration, lumbar region: Secondary | ICD-10-CM | POA: Diagnosis not present

## 2016-09-26 ENCOUNTER — Other Ambulatory Visit: Payer: Self-pay

## 2016-09-26 DIAGNOSIS — M5136 Other intervertebral disc degeneration, lumbar region: Secondary | ICD-10-CM | POA: Diagnosis not present

## 2016-09-26 DIAGNOSIS — M9903 Segmental and somatic dysfunction of lumbar region: Secondary | ICD-10-CM | POA: Diagnosis not present

## 2016-09-26 DIAGNOSIS — M9905 Segmental and somatic dysfunction of pelvic region: Secondary | ICD-10-CM | POA: Diagnosis not present

## 2016-09-26 DIAGNOSIS — E785 Hyperlipidemia, unspecified: Secondary | ICD-10-CM

## 2016-09-26 DIAGNOSIS — M5417 Radiculopathy, lumbosacral region: Secondary | ICD-10-CM | POA: Diagnosis not present

## 2016-09-26 MED ORDER — SIMVASTATIN 10 MG PO TABS: 10.0000 mg | ORAL_TABLET | Freq: Every day | ORAL | 3 refills | Status: DC

## 2016-09-26 NOTE — Telephone Encounter (Signed)
Last sgpt and lipids reviewed; Rx approved 

## 2016-09-28 DIAGNOSIS — M5417 Radiculopathy, lumbosacral region: Secondary | ICD-10-CM | POA: Diagnosis not present

## 2016-09-28 DIAGNOSIS — M9905 Segmental and somatic dysfunction of pelvic region: Secondary | ICD-10-CM | POA: Diagnosis not present

## 2016-09-28 DIAGNOSIS — M5136 Other intervertebral disc degeneration, lumbar region: Secondary | ICD-10-CM | POA: Diagnosis not present

## 2016-09-28 DIAGNOSIS — M9903 Segmental and somatic dysfunction of lumbar region: Secondary | ICD-10-CM | POA: Diagnosis not present

## 2016-09-30 DIAGNOSIS — M5136 Other intervertebral disc degeneration, lumbar region: Secondary | ICD-10-CM | POA: Diagnosis not present

## 2016-09-30 DIAGNOSIS — M9905 Segmental and somatic dysfunction of pelvic region: Secondary | ICD-10-CM | POA: Diagnosis not present

## 2016-09-30 DIAGNOSIS — M5417 Radiculopathy, lumbosacral region: Secondary | ICD-10-CM | POA: Diagnosis not present

## 2016-09-30 DIAGNOSIS — M9903 Segmental and somatic dysfunction of lumbar region: Secondary | ICD-10-CM | POA: Diagnosis not present

## 2016-10-04 DIAGNOSIS — L570 Actinic keratosis: Secondary | ICD-10-CM | POA: Diagnosis not present

## 2016-10-04 DIAGNOSIS — M5136 Other intervertebral disc degeneration, lumbar region: Secondary | ICD-10-CM | POA: Diagnosis not present

## 2016-10-04 DIAGNOSIS — M9905 Segmental and somatic dysfunction of pelvic region: Secondary | ICD-10-CM | POA: Diagnosis not present

## 2016-10-04 DIAGNOSIS — M9903 Segmental and somatic dysfunction of lumbar region: Secondary | ICD-10-CM | POA: Diagnosis not present

## 2016-10-04 DIAGNOSIS — X32XXXA Exposure to sunlight, initial encounter: Secondary | ICD-10-CM | POA: Diagnosis not present

## 2016-10-04 DIAGNOSIS — L821 Other seborrheic keratosis: Secondary | ICD-10-CM | POA: Diagnosis not present

## 2016-10-04 DIAGNOSIS — D225 Melanocytic nevi of trunk: Secondary | ICD-10-CM | POA: Diagnosis not present

## 2016-10-04 DIAGNOSIS — D2261 Melanocytic nevi of right upper limb, including shoulder: Secondary | ICD-10-CM | POA: Diagnosis not present

## 2016-10-04 DIAGNOSIS — D2272 Melanocytic nevi of left lower limb, including hip: Secondary | ICD-10-CM | POA: Diagnosis not present

## 2016-10-04 DIAGNOSIS — M5417 Radiculopathy, lumbosacral region: Secondary | ICD-10-CM | POA: Diagnosis not present

## 2016-10-05 DIAGNOSIS — M9905 Segmental and somatic dysfunction of pelvic region: Secondary | ICD-10-CM | POA: Diagnosis not present

## 2016-10-05 DIAGNOSIS — M9903 Segmental and somatic dysfunction of lumbar region: Secondary | ICD-10-CM | POA: Diagnosis not present

## 2016-10-05 DIAGNOSIS — M5417 Radiculopathy, lumbosacral region: Secondary | ICD-10-CM | POA: Diagnosis not present

## 2016-10-05 DIAGNOSIS — M5136 Other intervertebral disc degeneration, lumbar region: Secondary | ICD-10-CM | POA: Diagnosis not present

## 2016-10-07 DIAGNOSIS — M5136 Other intervertebral disc degeneration, lumbar region: Secondary | ICD-10-CM | POA: Diagnosis not present

## 2016-10-07 DIAGNOSIS — M9903 Segmental and somatic dysfunction of lumbar region: Secondary | ICD-10-CM | POA: Diagnosis not present

## 2016-10-07 DIAGNOSIS — M5417 Radiculopathy, lumbosacral region: Secondary | ICD-10-CM | POA: Diagnosis not present

## 2016-10-07 DIAGNOSIS — M9905 Segmental and somatic dysfunction of pelvic region: Secondary | ICD-10-CM | POA: Diagnosis not present

## 2016-10-10 DIAGNOSIS — M9905 Segmental and somatic dysfunction of pelvic region: Secondary | ICD-10-CM | POA: Diagnosis not present

## 2016-10-10 DIAGNOSIS — M9903 Segmental and somatic dysfunction of lumbar region: Secondary | ICD-10-CM | POA: Diagnosis not present

## 2016-10-10 DIAGNOSIS — M5136 Other intervertebral disc degeneration, lumbar region: Secondary | ICD-10-CM | POA: Diagnosis not present

## 2016-10-10 DIAGNOSIS — M5417 Radiculopathy, lumbosacral region: Secondary | ICD-10-CM | POA: Diagnosis not present

## 2016-10-12 DIAGNOSIS — M5136 Other intervertebral disc degeneration, lumbar region: Secondary | ICD-10-CM | POA: Diagnosis not present

## 2016-10-12 DIAGNOSIS — M5417 Radiculopathy, lumbosacral region: Secondary | ICD-10-CM | POA: Diagnosis not present

## 2016-10-12 DIAGNOSIS — M9903 Segmental and somatic dysfunction of lumbar region: Secondary | ICD-10-CM | POA: Diagnosis not present

## 2016-10-12 DIAGNOSIS — M9905 Segmental and somatic dysfunction of pelvic region: Secondary | ICD-10-CM | POA: Diagnosis not present

## 2016-10-17 DIAGNOSIS — M9905 Segmental and somatic dysfunction of pelvic region: Secondary | ICD-10-CM | POA: Diagnosis not present

## 2016-10-17 DIAGNOSIS — M5417 Radiculopathy, lumbosacral region: Secondary | ICD-10-CM | POA: Diagnosis not present

## 2016-10-17 DIAGNOSIS — M5136 Other intervertebral disc degeneration, lumbar region: Secondary | ICD-10-CM | POA: Diagnosis not present

## 2016-10-17 DIAGNOSIS — M9903 Segmental and somatic dysfunction of lumbar region: Secondary | ICD-10-CM | POA: Diagnosis not present

## 2016-10-19 ENCOUNTER — Telehealth: Payer: Self-pay | Admitting: Family Medicine

## 2016-10-19 DIAGNOSIS — M5136 Other intervertebral disc degeneration, lumbar region: Secondary | ICD-10-CM | POA: Diagnosis not present

## 2016-10-19 DIAGNOSIS — M9903 Segmental and somatic dysfunction of lumbar region: Secondary | ICD-10-CM | POA: Diagnosis not present

## 2016-10-19 DIAGNOSIS — M5417 Radiculopathy, lumbosacral region: Secondary | ICD-10-CM | POA: Diagnosis not present

## 2016-10-19 DIAGNOSIS — M9905 Segmental and somatic dysfunction of pelvic region: Secondary | ICD-10-CM | POA: Diagnosis not present

## 2016-10-19 NOTE — Telephone Encounter (Signed)
Pt has an appt with [facility] on Monday 10/24/2016 with Dr Sharlot Gowda. NPI # is [id number], Diagnosis codes are M 99.03, M 51.36, M 99.05, M 54.17, M 99.01, M 62.830. Pt needs a HTN referral please.

## 2016-10-24 ENCOUNTER — Telehealth: Payer: Self-pay | Admitting: Family Medicine

## 2016-10-24 ENCOUNTER — Other Ambulatory Visit: Payer: Self-pay | Admitting: Family Medicine

## 2016-10-24 ENCOUNTER — Other Ambulatory Visit: Payer: Self-pay

## 2016-10-24 DIAGNOSIS — M5136 Other intervertebral disc degeneration, lumbar region: Secondary | ICD-10-CM | POA: Diagnosis not present

## 2016-10-24 DIAGNOSIS — M9903 Segmental and somatic dysfunction of lumbar region: Secondary | ICD-10-CM | POA: Diagnosis not present

## 2016-10-24 DIAGNOSIS — M5417 Radiculopathy, lumbosacral region: Secondary | ICD-10-CM | POA: Diagnosis not present

## 2016-10-24 DIAGNOSIS — M9905 Segmental and somatic dysfunction of pelvic region: Secondary | ICD-10-CM | POA: Diagnosis not present

## 2016-10-24 MED ORDER — DICLOFENAC SODIUM 75 MG PO TBEC: 75.0000 mg | DELAYED_RELEASE_TABLET | Freq: Two times a day (BID) | ORAL | 0 refills | Status: DC | PRN

## 2016-10-24 MED ORDER — CHLORTHALIDONE 25 MG PO TABS: 12.5000 mg | ORAL_TABLET | Freq: Every day | ORAL | 1 refills | Status: DC

## 2016-10-24 NOTE — Telephone Encounter (Signed)
Patient states that diclofenc has expired it was actually prescribed by Dr. Sharlet Salina. She would like to know if you would start refilling the medication. Also she is needing a refill chlorthalidone. Please send to [facility] mail order.

## 2016-10-24 NOTE — Telephone Encounter (Signed)
Patient was seen last month for her wellness exam and received a $10 bill in the mail and does not understand why. I asked the patient if her and the doctor had discussed anything out side of her wellness exam (ex med refill, depression etc) and patient stated that she did not think so. Please return call to discuss this matter. Pt stated that she did pay the balance however she just wanted to make sure she was suppose to.

## 2016-10-25 MED ORDER — DICLOFENAC SODIUM 75 MG PO TBEC: 75.0000 mg | DELAYED_RELEASE_TABLET | Freq: Two times a day (BID) | ORAL | 0 refills | Status: DC | PRN

## 2016-10-25 MED ORDER — CHLORTHALIDONE 25 MG PO TABS: 12.5000 mg | ORAL_TABLET | Freq: Every day | ORAL | 1 refills | Status: DC

## 2016-10-25 NOTE — Telephone Encounter (Signed)
Last Creatinine reviewed rxs approved

## 2016-10-26 DIAGNOSIS — M9905 Segmental and somatic dysfunction of pelvic region: Secondary | ICD-10-CM | POA: Diagnosis not present

## 2016-10-26 DIAGNOSIS — M5417 Radiculopathy, lumbosacral region: Secondary | ICD-10-CM | POA: Diagnosis not present

## 2016-10-26 DIAGNOSIS — M9903 Segmental and somatic dysfunction of lumbar region: Secondary | ICD-10-CM | POA: Diagnosis not present

## 2016-10-26 DIAGNOSIS — M5136 Other intervertebral disc degeneration, lumbar region: Secondary | ICD-10-CM | POA: Diagnosis not present

## 2016-10-31 DIAGNOSIS — M9905 Segmental and somatic dysfunction of pelvic region: Secondary | ICD-10-CM | POA: Diagnosis not present

## 2016-10-31 DIAGNOSIS — M5417 Radiculopathy, lumbosacral region: Secondary | ICD-10-CM | POA: Diagnosis not present

## 2016-10-31 DIAGNOSIS — M5136 Other intervertebral disc degeneration, lumbar region: Secondary | ICD-10-CM | POA: Diagnosis not present

## 2016-10-31 DIAGNOSIS — M9903 Segmental and somatic dysfunction of lumbar region: Secondary | ICD-10-CM | POA: Diagnosis not present

## 2016-11-02 DIAGNOSIS — M9905 Segmental and somatic dysfunction of pelvic region: Secondary | ICD-10-CM | POA: Diagnosis not present

## 2016-11-02 DIAGNOSIS — M5136 Other intervertebral disc degeneration, lumbar region: Secondary | ICD-10-CM | POA: Diagnosis not present

## 2016-11-02 DIAGNOSIS — M5417 Radiculopathy, lumbosacral region: Secondary | ICD-10-CM | POA: Diagnosis not present

## 2016-11-02 DIAGNOSIS — M9903 Segmental and somatic dysfunction of lumbar region: Secondary | ICD-10-CM | POA: Diagnosis not present

## 2016-11-08 DIAGNOSIS — M9903 Segmental and somatic dysfunction of lumbar region: Secondary | ICD-10-CM | POA: Diagnosis not present

## 2016-11-08 DIAGNOSIS — M5136 Other intervertebral disc degeneration, lumbar region: Secondary | ICD-10-CM | POA: Diagnosis not present

## 2016-11-08 DIAGNOSIS — M5417 Radiculopathy, lumbosacral region: Secondary | ICD-10-CM | POA: Diagnosis not present

## 2016-11-08 DIAGNOSIS — M9905 Segmental and somatic dysfunction of pelvic region: Secondary | ICD-10-CM | POA: Diagnosis not present

## 2016-11-15 DIAGNOSIS — M9903 Segmental and somatic dysfunction of lumbar region: Secondary | ICD-10-CM | POA: Diagnosis not present

## 2016-11-15 DIAGNOSIS — M9905 Segmental and somatic dysfunction of pelvic region: Secondary | ICD-10-CM | POA: Diagnosis not present

## 2016-11-15 DIAGNOSIS — M5417 Radiculopathy, lumbosacral region: Secondary | ICD-10-CM | POA: Diagnosis not present

## 2016-11-15 DIAGNOSIS — M5136 Other intervertebral disc degeneration, lumbar region: Secondary | ICD-10-CM | POA: Diagnosis not present

## 2016-11-22 DIAGNOSIS — M5417 Radiculopathy, lumbosacral region: Secondary | ICD-10-CM | POA: Diagnosis not present

## 2016-11-22 DIAGNOSIS — M9903 Segmental and somatic dysfunction of lumbar region: Secondary | ICD-10-CM | POA: Diagnosis not present

## 2016-11-22 DIAGNOSIS — M9905 Segmental and somatic dysfunction of pelvic region: Secondary | ICD-10-CM | POA: Diagnosis not present

## 2016-11-22 DIAGNOSIS — M5136 Other intervertebral disc degeneration, lumbar region: Secondary | ICD-10-CM | POA: Diagnosis not present

## 2016-11-29 DIAGNOSIS — M9903 Segmental and somatic dysfunction of lumbar region: Secondary | ICD-10-CM | POA: Diagnosis not present

## 2016-11-29 DIAGNOSIS — M5136 Other intervertebral disc degeneration, lumbar region: Secondary | ICD-10-CM | POA: Diagnosis not present

## 2016-11-29 DIAGNOSIS — M5417 Radiculopathy, lumbosacral region: Secondary | ICD-10-CM | POA: Diagnosis not present

## 2016-11-29 DIAGNOSIS — M9905 Segmental and somatic dysfunction of pelvic region: Secondary | ICD-10-CM | POA: Diagnosis not present

## 2016-12-06 DIAGNOSIS — M9903 Segmental and somatic dysfunction of lumbar region: Secondary | ICD-10-CM | POA: Diagnosis not present

## 2016-12-06 DIAGNOSIS — M9905 Segmental and somatic dysfunction of pelvic region: Secondary | ICD-10-CM | POA: Diagnosis not present

## 2016-12-06 DIAGNOSIS — M5417 Radiculopathy, lumbosacral region: Secondary | ICD-10-CM | POA: Diagnosis not present

## 2016-12-06 DIAGNOSIS — M5136 Other intervertebral disc degeneration, lumbar region: Secondary | ICD-10-CM | POA: Diagnosis not present

## 2016-12-20 DIAGNOSIS — M9903 Segmental and somatic dysfunction of lumbar region: Secondary | ICD-10-CM | POA: Diagnosis not present

## 2016-12-20 DIAGNOSIS — M5136 Other intervertebral disc degeneration, lumbar region: Secondary | ICD-10-CM | POA: Diagnosis not present

## 2016-12-20 DIAGNOSIS — M9905 Segmental and somatic dysfunction of pelvic region: Secondary | ICD-10-CM | POA: Diagnosis not present

## 2016-12-20 DIAGNOSIS — M5417 Radiculopathy, lumbosacral region: Secondary | ICD-10-CM | POA: Diagnosis not present

## 2017-01-10 DIAGNOSIS — M9903 Segmental and somatic dysfunction of lumbar region: Secondary | ICD-10-CM | POA: Diagnosis not present

## 2017-01-10 DIAGNOSIS — M5136 Other intervertebral disc degeneration, lumbar region: Secondary | ICD-10-CM | POA: Diagnosis not present

## 2017-01-10 DIAGNOSIS — M5417 Radiculopathy, lumbosacral region: Secondary | ICD-10-CM | POA: Diagnosis not present

## 2017-01-10 DIAGNOSIS — M9905 Segmental and somatic dysfunction of pelvic region: Secondary | ICD-10-CM | POA: Diagnosis not present

## 2017-01-24 ENCOUNTER — Telehealth: Payer: Self-pay | Admitting: Family Medicine

## 2017-01-24 NOTE — Telephone Encounter (Signed)
Pt would like a call back about her RX

## 2017-01-25 ENCOUNTER — Other Ambulatory Visit: Payer: Self-pay | Admitting: Family Medicine

## 2017-01-25 ENCOUNTER — Other Ambulatory Visit: Payer: Self-pay

## 2017-01-25 DIAGNOSIS — I10 Essential (primary) hypertension: Secondary | ICD-10-CM

## 2017-01-25 MED ORDER — METOPROLOL SUCCINATE ER 25 MG PO TB24: 25.0000 mg | ORAL_TABLET | Freq: Every day | ORAL | 3 refills | Status: DC

## 2017-01-25 NOTE — Telephone Encounter (Signed)
Cr and cbc reviewed; Rx approved

## 2017-01-25 NOTE — Telephone Encounter (Signed)
rx approved

## 2017-02-08 ENCOUNTER — Other Ambulatory Visit: Payer: Self-pay | Admitting: Family Medicine

## 2017-02-08 NOTE — Telephone Encounter (Signed)
Sept labs reviewed 6 month supply sent in November; patient should not run out until May Please resolve with pharmacy

## 2017-02-09 NOTE — Telephone Encounter (Signed)
Resolved with pharmacy

## 2017-02-26 DIAGNOSIS — J111 Influenza due to unidentified influenza virus with other respiratory manifestations: Secondary | ICD-10-CM | POA: Diagnosis not present

## 2017-03-14 ENCOUNTER — Ambulatory Visit: Payer: Commercial Managed Care - HMO | Admitting: Family Medicine

## 2017-03-22 DIAGNOSIS — M5417 Radiculopathy, lumbosacral region: Secondary | ICD-10-CM | POA: Diagnosis not present

## 2017-03-22 DIAGNOSIS — M5136 Other intervertebral disc degeneration, lumbar region: Secondary | ICD-10-CM | POA: Diagnosis not present

## 2017-03-22 DIAGNOSIS — M9905 Segmental and somatic dysfunction of pelvic region: Secondary | ICD-10-CM | POA: Diagnosis not present

## 2017-03-22 DIAGNOSIS — M9903 Segmental and somatic dysfunction of lumbar region: Secondary | ICD-10-CM | POA: Diagnosis not present

## 2017-03-24 DIAGNOSIS — M9905 Segmental and somatic dysfunction of pelvic region: Secondary | ICD-10-CM | POA: Diagnosis not present

## 2017-03-24 DIAGNOSIS — M5417 Radiculopathy, lumbosacral region: Secondary | ICD-10-CM | POA: Diagnosis not present

## 2017-03-24 DIAGNOSIS — M5136 Other intervertebral disc degeneration, lumbar region: Secondary | ICD-10-CM | POA: Diagnosis not present

## 2017-03-24 DIAGNOSIS — M9903 Segmental and somatic dysfunction of lumbar region: Secondary | ICD-10-CM | POA: Diagnosis not present

## 2017-03-27 ENCOUNTER — Ambulatory Visit (INDEPENDENT_AMBULATORY_CARE_PROVIDER_SITE_OTHER): Payer: Commercial Managed Care - HMO | Admitting: Family Medicine

## 2017-03-27 ENCOUNTER — Encounter: Payer: Self-pay | Admitting: Family Medicine

## 2017-03-27 VITALS — BP 136/78 | HR 79 | Temp 98.0°F | Resp 14 | Ht 63.0 in | Wt 138.3 lb

## 2017-03-27 DIAGNOSIS — M5417 Radiculopathy, lumbosacral region: Secondary | ICD-10-CM | POA: Diagnosis not present

## 2017-03-27 DIAGNOSIS — M5136 Other intervertebral disc degeneration, lumbar region: Secondary | ICD-10-CM | POA: Diagnosis not present

## 2017-03-27 DIAGNOSIS — M9903 Segmental and somatic dysfunction of lumbar region: Secondary | ICD-10-CM | POA: Diagnosis not present

## 2017-03-27 DIAGNOSIS — M9905 Segmental and somatic dysfunction of pelvic region: Secondary | ICD-10-CM | POA: Diagnosis not present

## 2017-03-27 DIAGNOSIS — Z5181 Encounter for therapeutic drug level monitoring: Secondary | ICD-10-CM

## 2017-03-27 DIAGNOSIS — E785 Hyperlipidemia, unspecified: Secondary | ICD-10-CM

## 2017-03-27 DIAGNOSIS — Z0000 Encounter for general adult medical examination without abnormal findings: Secondary | ICD-10-CM

## 2017-03-27 MED ORDER — CHLORTHALIDONE 25 MG PO TABS: 12.5000 mg | ORAL_TABLET | Freq: Every day | ORAL | 1 refills | Status: DC

## 2017-03-27 NOTE — Progress Notes (Signed)
Patient: Rachel James, Female    DOB: 22-Aug-1946, 71 y.o.   MRN: [id number]  Visit Date: 03/29/2017  Today's Provider: Enid Derry, MD   Chief Complaint  Patient presents with  . Medicare Wellness    Subjective:   Rachel James is a 71 y.o. female who presents today for her Subsequent Annual Wellness Visit.  Caregiver input:  n/a Had the flu and some stressful events lately; treated with tamiflu; did get the flu shot, so maybe not as bad  USPSTF grade A and B recommendations Depression:  Depression screen [facility] 2/9 03/27/2017 08/16/2016 07/20/2015 05/27/2015  Decreased Interest 0 0 0 0  Down, Depressed, Hopeless 0 0 0 0  PHQ - 2 Score 0 0 0 0   Hypertension: BP Readings from Last 3 Encounters:  03/27/17 136/78  09/08/16 118/72  08/16/16 110/72   Obesity: Wt Readings from Last 3 Encounters:  03/27/17 138 lb 4.8 oz (62.7 kg)  09/08/16 136 lb (61.7 kg)  08/16/16 137 lb (62.1 kg)   BMI Readings from Last 3 Encounters:  03/27/17 24.50 kg/m  09/08/16 24.87 kg/m  08/16/16 25.06 kg/m    Alcohol: occasionally, less than 7 per week Tobacco use: never user HIV, hep B, hep C: hep C UTD STD testing and prevention (chl/gon/syphilis): declined, others neg Intimate partner violence: no abuse Breast cancer: 07/07/16, negative, next due 07/08/2017 or just after No results found for: [facility]  Cervical cancer screening: stopped at 31, no new partners, no abnormal paps  Osteoporosis: osteopenia  No results found for: HMDEXASCAN  Fall prevention/vitamin D: discussed, not taking vit D Lipids: on statin Lab Results  Component Value Date   CHOL 158 08/23/2016   CHOL 228 (H) 09/08/2015   CHOL 229 (H) 05/27/2015   Lab Results  Component Value Date   HDL 68 08/23/2016   HDL 74 09/08/2015   HDL 90 05/27/2015   Lab Results  Component Value Date   LDLCALC 75 08/23/2016   LDLCALC 136 (H) 09/08/2015   LDLCALC 126 (H) 05/27/2015   Lab Results  Component Value Date   TRIG 77  08/23/2016   TRIG 91 09/08/2015   TRIG 64 05/27/2015   Lab Results  Component Value Date   CHOLHDL 3.1 09/08/2015   CHOLHDL 2.5 05/27/2015   No results found for: LDLDIRECT  Glucose:  Glucose  Date Value Ref Range Status  08/23/2016 79 65 - 99 mg/dL Final  09/08/2015 83 65 - 99 mg/dL Final   Colorectal cancer: 2013; found polyps, sent off Lung cancer:  Never smoker AAA: no one in the family Aspirin: taking 81 mg daily 3x a week, tends to bruise if more Diet: good diet Exercise: need to be more active; goes to water aerobics once a week, did line dancing, going to chiropractor Skin cancer: one on back that itches; had a spot frozen on left side of the nose; will go back  HPI  Review of Systems  Past Medical History:  Diagnosis Date  . Allergy   . Arthritis   . Hypertension     Past Surgical History:  Procedure Laterality Date  .  THUMB SURGERY Left   . BREAST LUMPECTOMY Right   . BUNIONECTOMY Bilateral   . CERVICAL DISCECTOMY  09/18/2012   C4-C5 AND C6-C7 ACDF   . TUBAL LIGATION Bilateral   MD note: polypectomy  Family History  Problem Relation Age of Onset  . Heart disease Mother   . Heart attack Mother   . Heart  disease Father   . Heart disease Sister   . Heart disease Brother   . Heart failure Brother   . Memory loss Brother   . Diabetes Paternal Grandmother   . Pulmonary embolism Sister   no colon cancer in the family Mother had vascular dementia, due to hardening of the arteries  Social History   Social History  . Marital status: Unknown    Spouse name: N/A  . Number of children: N/A  . Years of education: N/A   Occupational History  . retired    Social History Main Topics  . Smoking status: Never Smoker  . Smokeless tobacco: Never Used  . Alcohol use 0.0 oz/week     Comment: maybe 3 drinks per month  . Drug use: No  . Sexual activity: Not Currently   Other Topics Concern  . Not on file   Social History Narrative  . No narrative on  file    Outpatient Encounter Prescriptions as of 03/27/2017  Medication Sig Note  . Alpha-D-Galactosidase (BEANO) TABS Take 1 tablet by mouth 2 (two) times daily.   Marland Kitchen aspirin 81 MG tablet Take 81 mg by mouth daily.   . chlorthalidone (HYGROTON) 25 MG tablet Take 0.5 tablets (12.5 mg total) by mouth daily.   . diclofenac (VOLTAREN) 75 MG EC tablet TAKE 1 TABLET TWICE DAILY AS NEEDED   . Heat Wraps (HEAT THERAPY PATCHES) MISC  05/27/2015: Received from: External Pharmacy  . Loratadine 10 MG CAPS Take 10 mg by mouth daily. 09/08/2016: Received from: External Pharmacy  . magnesium 30 MG tablet Take 30 mg by mouth daily.    . metoprolol succinate (TOPROL-XL) 25 MG 24 hr tablet Take 1 tablet (25 mg total) by mouth daily.   . MULTIPLE VITAMINS-MINERALS ER PO Take by mouth. 05/27/2015: Received from: [facility]  . omeprazole (PRILOSEC) 20 MG capsule TAKE 1 CAPSULE EVERY DAY   . simvastatin (ZOCOR) 10 MG tablet Take 1 tablet (10 mg total) by mouth daily.   . [DISCONTINUED] calcium carbonate 1250 MG capsule Take 1,250 mg by mouth 2 (two) times daily with a meal.   . [DISCONTINUED] chlorthalidone (HYGROTON) 25 MG tablet Take 0.5 tablets (12.5 mg total) by mouth daily.   . [DISCONTINUED] Sunscreens (RAY BLOCK SUNSCREEN) 3-7 % LOTN  05/27/2015: Received from: External Pharmacy   No facility-administered encounter medications on file as of 03/27/2017.     Functional Ability / Safety Screening 1.  Was the timed Get Up and Go test longer than 30 seconds?  no 2.  Does the patient need help with the phone, transportation, shopping,      preparing meals, housework, laundry, medications, or managing money?  no 3.  Does the patient's home have:  loose throw rugs in the hallway?   no      Grab bars in the bathroom? yes      Handrails on the stairs?   no      Poor lighting?   no 4.  Has the patient noticed any hearing difficulties?   yes Hearing eval before Christmas, hearing normal for age, no hearing aide needed  yet  Fall Risk Assessment See under rooming  Depression Screen See under rooming Depression screen [facility] 2/9 03/27/2017 08/16/2016 07/20/2015 05/27/2015  Decreased Interest 0 0 0 0  Down, Depressed, Hopeless 0 0 0 0  PHQ - 2 Score 0 0 0 0    Advanced Directives Does patient have a HCPOA?    no If yes, name  and contact information:  Does patient have a living will or MOST form?  no   Care Team Osker Mason, chiropractor Optometrist at [facility] Dr. Tami Ribas, ENT (was going to do tonsillectomy for tonsilloliths)  Objective:   Vitals: BP 136/78   Pulse 79   Temp 98 F (36.7 C) (Oral)   Resp 14   Ht 5\' 3"  (1.6 m)   Wt 138 lb 4.8 oz (62.7 kg)   SpO2 97%   BMI 24.50 kg/m  Body mass index is 24.5 kg/m. No exam data present  Physical Exam Mood/affect:   Appearance:    Cognitive Testing - 6-CIT  Correct? Score   What year is it? yes 0 Yes = 0    No = 4  What month is it? yes 0 Yes = 0    No = 3  Remember:     [place], [place]     What time is it? yes 0 Yes = 0    No = 3  Count backwards from 20 to 1 yes 0 Correct = 0    1 error = 2   More than 1 error = 4  Say the months of the year in reverse. yes 0 Correct = 0    1 error = 2   More than 1 error = 4  What address did I ask you to remember? yes 0 Correct = 0  1 error = 2    2 error = 4    3 error = 6    4 error = 8    All wrong = 10       TOTAL SCORE  0/28   Interpretation:  Normal  Normal (0-7) Abnormal (8-28)    Assessment & Plan:     Annual Wellness Visit  Reviewed patient's Family Medical History Reviewed and updated list of patient's medical providers Assessment of cognitive impairment was done Assessed patient's functional ability Established a written schedule for health screening [place] Completed and Reviewed  Exercise Activities and Dietary recommendations Goals    None    try to exercise more, build up gradually  Immunization History  Administered  Date(s) Administered  . Influenza, High Dose Seasonal PF 09/17/2015, 08/16/2016  . Pneumococcal Conjugate-13 04/21/2015  . Pneumococcal Polysaccharide-23 08/16/2016    Health Maintenance  Topic Date Due  . DEXA SCAN  06/16/2017  . MAMMOGRAM  07/07/2017  . INFLUENZA VACCINE  07/19/2017  . COLONOSCOPY  01/19/2022  . TETANUS/TDAP  07/12/2024  . Hepatitis C Screening  Completed  . PNA vac Low Risk Adult  Completed    Discussed health benefits of physical activity, and encouraged her to engage in regular exercise appropriate for her age and condition.   Problem List Items Addressed This Visit      Other   Preventative health care - Primary    USPSTF grade A and B recommendations reviewed with patient; age-appropriate recommendations, preventive care, screening tests, etc discussed and encouraged; healthy living encouraged; see AVS for patient education given to patient      Medication monitoring encounter   Relevant Orders   COMPLETE METABOLIC PANEL WITH GFR   Hyperlipidemia LDL goal <100 (Chronic)    Check labs      Relevant Medications   aspirin 81 MG tablet   chlorthalidone (HYGROTON) 25 MG tablet   Other Relevant Orders   Lipid panel      Meds ordered this encounter  Medications  .  aspirin 81 MG tablet    Sig: Take 81 mg by mouth daily.  . Alpha-D-Galactosidase (BEANO) TABS    Sig: Take 1 tablet by mouth 2 (two) times daily.  . chlorthalidone (HYGROTON) 25 MG tablet    Sig: Take 0.5 tablets (12.5 mg total) by mouth daily.    Dispense:  45 tablet    Refill:  1    Current Outpatient Prescriptions:  .  Alpha-D-Galactosidase (BEANO) TABS, Take 1 tablet by mouth 2 (two) times daily., Disp: , Rfl:  .  aspirin 81 MG tablet, Take 81 mg by mouth daily., Disp: , Rfl:  .  chlorthalidone (HYGROTON) 25 MG tablet, Take 0.5 tablets (12.5 mg total) by mouth daily., Disp: 45 tablet, Rfl: 1 .  diclofenac (VOLTAREN) 75 MG EC tablet, TAKE 1 TABLET TWICE DAILY AS NEEDED, Disp: 180  tablet, Rfl: 0 .  Heat Wraps (HEAT THERAPY PATCHES) MISC, , Disp: , Rfl:  .  Loratadine 10 MG CAPS, Take 10 mg by mouth daily., Disp: , Rfl:  .  magnesium 30 MG tablet, Take 30 mg by mouth daily. , Disp: , Rfl:  .  metoprolol succinate (TOPROL-XL) 25 MG 24 hr tablet, Take 1 tablet (25 mg total) by mouth daily., Disp: 90 tablet, Rfl: 3 .  MULTIPLE VITAMINS-MINERALS ER PO, Take by mouth., Disp: , Rfl:  .  omeprazole (PRILOSEC) 20 MG capsule, TAKE 1 CAPSULE EVERY DAY, Disp: 90 capsule, Rfl: 1 .  simvastatin (ZOCOR) 10 MG tablet, Take 1 tablet (10 mg total) by mouth daily., Disp: 90 tablet, Rfl: 3 Medications Discontinued During This Encounter  Medication Reason  . Sunscreens (RAY BLOCK SUNSCREEN) 3-7 % LOTN   . calcium carbonate 1250 MG capsule   . chlorthalidone (HYGROTON) 25 MG tablet Reorder    Next Medicare Wellness Visit in 12+ months

## 2017-03-27 NOTE — Patient Instructions (Addendum)
Request the actual path report from [place] from previous colonoscopy That will depend on whether your next colonoscopy is due now or five years from now I'll recommend vitamin D 1,000 iu of vitamin D3 once a day  Health Maintenance  Topic Date Due  . DEXA SCAN  06/16/2017  . MAMMOGRAM  07/07/2017  . INFLUENZA VACCINE  07/19/2017  . COLONOSCOPY  01/19/2022  . TETANUS/TDAP  07/12/2024  . Hepatitis C Screening  Completed  . PNA vac Low Risk Adult  Completed   Health Maintenance, Female Adopting a healthy lifestyle and getting preventive care can go a long way to promote health and wellness. Talk with your health care provider about what schedule of regular examinations is right for you. This is a good chance for you to check in with your provider about disease prevention and staying healthy. In between checkups, there are plenty of things you can do on your own. Experts have done a lot of research about which lifestyle changes and preventive measures are most likely to keep you healthy. Ask your health care provider for more information. Weight and diet Eat a healthy diet  Be sure to include plenty of vegetables, fruits, low-fat dairy products, and lean protein.  Do not eat a lot of foods high in solid fats, added sugars, or salt.  Get regular exercise. This is one of the most important things you can do for your health.  Most adults should exercise for at least 150 minutes each week. The exercise should increase your heart rate and make you sweat (moderate-intensity exercise).  Most adults should also do strengthening exercises at least twice a week. This is in addition to the moderate-intensity exercise. Maintain a healthy weight  Body mass index (BMI) is a measurement that can be used to identify possible weight problems. It estimates body fat based on height and weight. Your health care provider can help determine your BMI and help you achieve or maintain a healthy weight.  For  females 48 years of age and older:  A BMI below 18.5 is considered underweight.  A BMI of 18.5 to 24.9 is normal.  A BMI of 25 to 29.9 is considered overweight.  A BMI of 30 and above is considered obese. Watch levels of cholesterol and blood lipids  You should start having your blood tested for lipids and cholesterol at 69 years of age, then have this test every 5 years.  You may need to have your cholesterol levels checked more often if:  Your lipid or cholesterol levels are high.  You are older than 69 years of age.  You are at high risk for heart disease. Cancer screening Lung Cancer  Lung cancer screening is recommended for adults 33-77 years old who are at high risk for lung cancer because of a history of smoking.  A yearly low-dose CT scan of the lungs is recommended for people who:  Currently smoke.  Have quit within the past 15 years.  Have at least a 30-pack-year history of smoking. A pack year is smoking an average of one pack of cigarettes a day for 1 year.  Yearly screening should continue until it has been 15 years since you quit.  Yearly screening should stop if you develop a health problem that would prevent you from having lung cancer treatment. Breast Cancer  Practice breast self-awareness. This means understanding how your breasts normally appear and feel.  It also means doing regular breast self-exams. Let your health care provider  know about any changes, no matter how small.  If you are in your 20s or 30s, you should have a clinical breast exam (CBE) by a health care provider every 1-3 years as part of a regular health exam.  If you are 4 or older, have a CBE every year. Also consider having a breast X-ray (mammogram) every year.  If you have a family history of breast cancer, talk to your health care provider about genetic screening.  If you are at high risk for breast cancer, talk to your health care provider about having an MRI and a mammogram  every year.  Breast cancer gene (BRCA) assessment is recommended for women who have family members with BRCA-related cancers. BRCA-related cancers include:  Breast.  Ovarian.  Tubal.  Peritoneal cancers.  Results of the assessment will determine the need for genetic counseling and BRCA1 and BRCA2 testing. Cervical Cancer  Your health care provider may recommend that you be screened regularly for cancer of the pelvic organs (ovaries, uterus, and vagina). This screening involves a pelvic examination, including checking for microscopic changes to the surface of your cervix (Pap test). You may be encouraged to have this screening done every 3 years, beginning at age 38.  For women ages 51-65, health care providers may recommend pelvic exams and Pap testing every 3 years, or they may recommend the Pap and pelvic exam, combined with testing for human papilloma virus (HPV), every 5 years. Some types of HPV increase your risk of cervical cancer. Testing for HPV may also be done on women of any age with unclear Pap test results.  Other health care providers may not recommend any screening for nonpregnant women who are considered low risk for pelvic cancer and who do not have symptoms. Ask your health care provider if a screening pelvic exam is right for you.  If you have had past treatment for cervical cancer or a condition that could lead to cancer, you need Pap tests and screening for cancer for at least 20 years after your treatment. If Pap tests have been discontinued, your risk factors (such as having a new sexual partner) need to be reassessed to determine if screening should resume. Some women have medical problems that increase the chance of getting cervical cancer. In these cases, your health care provider may recommend more frequent screening and Pap tests. Colorectal Cancer  This type of cancer can be detected and often prevented.  Routine colorectal cancer screening usually begins at 69  years of age and continues through 69 years of age.  Your health care provider may recommend screening at an earlier age if you have risk factors for colon cancer.  Your health care provider may also recommend using home test kits to check for hidden blood in the stool.  A small camera at the end of a tube can be used to examine your colon directly (sigmoidoscopy or colonoscopy). This is done to check for the earliest forms of colorectal cancer.  Routine screening usually begins at age 55.  Direct examination of the colon should be repeated every 5-10 years through 69 years of age. However, you may need to be screened more often if early forms of precancerous polyps or small growths are found. Skin Cancer  Check your skin from head to toe regularly.  Tell your health care provider about any new moles or changes in moles, especially if there is a change in a mole's shape or color.  Also tell your health care provider  if you have a mole that is larger than the size of a pencil eraser.  Always use sunscreen. Apply sunscreen liberally and repeatedly throughout the day.  Protect yourself by wearing long sleeves, pants, a wide-brimmed hat, and sunglasses whenever you are outside. Heart disease, diabetes, and high blood pressure  High blood pressure causes heart disease and increases the risk of stroke. High blood pressure is more likely to develop in:  People who have blood pressure in the high end of the normal range (130-139/85-89 mm Hg).  People who are overweight or obese.  People who are African American.  If you are 48-42 years of age, have your blood pressure checked every 3-5 years. If you are 34 years of age or older, have your blood pressure checked every year. You should have your blood pressure measured twice-once when you are at a hospital or clinic, and once when you are not at a hospital or clinic. Record the average of the two measurements. To check your blood pressure when  you are not at a hospital or clinic, you can use:  An automated blood pressure machine at a pharmacy.  A home blood pressure monitor.  If you are between 80 years and 69 years old, ask your health care provider if you should take aspirin to prevent strokes.  Have regular diabetes screenings. This involves taking a blood sample to check your fasting blood sugar level.  If you are at a normal weight and have a low risk for diabetes, have this test once every three years after 69 years of age.  If you are overweight and have a high risk for diabetes, consider being tested at a younger age or more often. Preventing infection Hepatitis B  If you have a higher risk for hepatitis B, you should be screened for this virus. You are considered at high risk for hepatitis B if:  You were born in a country where hepatitis B is common. Ask your health care provider which countries are considered high risk.  Your parents were born in a high-risk country, and you have not been immunized against hepatitis B (hepatitis B vaccine).  You have HIV or AIDS.  You use needles to inject street drugs.  You live with someone who has hepatitis B.  You have had sex with someone who has hepatitis B.  You get hemodialysis treatment.  You take certain medicines for conditions, including cancer, organ transplantation, and autoimmune conditions. Hepatitis C  Blood testing is recommended for:  Everyone born from [birth year] through 1963.  Anyone with known risk factors for hepatitis C. Sexually transmitted infections (STIs)  You should be screened for sexually transmitted infections (STIs) including gonorrhea and chlamydia if:  You are sexually active and are younger than 69 years of age.  You are older than 69 years of age and your health care provider tells you that you are at risk for this type of infection.  Your sexual activity has changed since you were last screened and you are at an increased risk for  chlamydia or gonorrhea. Ask your health care provider if you are at risk.  If you do not have HIV, but are at risk, it may be recommended that you take a prescription medicine daily to prevent HIV infection. This is called pre-exposure prophylaxis (PrEP). You are considered at risk if:  You are sexually active and do not regularly use condoms or know the HIV status of your partner(s).  You take drugs by injection.  You are sexually active with a partner who has HIV. Talk with your health care provider about whether you are at high risk of being infected with HIV. If you choose to begin PrEP, you should first be tested for HIV. You should then be tested every 3 months for as long as you are taking PrEP. Pregnancy  If you are premenopausal and you may become pregnant, ask your health care provider about preconception counseling.  If you may become pregnant, take 400 to 800 micrograms (mcg) of folic acid every day.  If you want to prevent pregnancy, talk to your health care provider about birth control (contraception). Osteoporosis and menopause  Osteoporosis is a disease in which the bones lose minerals and strength with aging. This can result in serious bone fractures. Your risk for osteoporosis can be identified using a bone density scan.  If you are 47 years of age or older, or if you are at risk for osteoporosis and fractures, ask your health care provider if you should be screened.  Ask your health care provider whether you should take a calcium or vitamin D supplement to lower your risk for osteoporosis.  Menopause may have certain physical symptoms and risks.  Hormone replacement therapy may reduce some of these symptoms and risks. Talk to your health care provider about whether hormone replacement therapy is right for you. Follow these instructions at home:  Schedule regular health, dental, and eye exams.  Stay current with your immunizations.  Do not use any tobacco products  including cigarettes, chewing tobacco, or electronic cigarettes.  If you are pregnant, do not drink alcohol.  If you are breastfeeding, limit how much and how often you drink alcohol.  Limit alcohol intake to no more than 1 drink per day for nonpregnant women. One drink equals 12 ounces of beer, 5 ounces of wine, or 1 ounces of hard liquor.  Do not use street drugs.  Do not share needles.  Ask your health care provider for help if you need support or information about quitting drugs.  Tell your health care provider if you often feel depressed.  Tell your health care provider if you have ever been abused or do not feel safe at home. This information is not intended to replace advice given to you by your health care provider. Make sure you discuss any questions you have with your health care provider. Document Released: 06/20/2011 Document Revised: 05/12/2016 Document Reviewed: 09/08/2015 Elsevier Interactive Patient Education  2017 [employer].

## 2017-03-29 DIAGNOSIS — M9905 Segmental and somatic dysfunction of pelvic region: Secondary | ICD-10-CM | POA: Diagnosis not present

## 2017-03-29 DIAGNOSIS — E785 Hyperlipidemia, unspecified: Secondary | ICD-10-CM | POA: Diagnosis not present

## 2017-03-29 DIAGNOSIS — M9903 Segmental and somatic dysfunction of lumbar region: Secondary | ICD-10-CM | POA: Diagnosis not present

## 2017-03-29 DIAGNOSIS — M5417 Radiculopathy, lumbosacral region: Secondary | ICD-10-CM | POA: Diagnosis not present

## 2017-03-29 DIAGNOSIS — M5136 Other intervertebral disc degeneration, lumbar region: Secondary | ICD-10-CM | POA: Diagnosis not present

## 2017-03-29 DIAGNOSIS — Z5181 Encounter for therapeutic drug level monitoring: Secondary | ICD-10-CM | POA: Diagnosis not present

## 2017-03-29 LAB — COMPLETE METABOLIC PANEL WITH GFR
ALT: 21 U/L (ref 6–29)
AST: 23 U/L (ref 10–35)
Albumin: 4.1 g/dL (ref 3.6–5.1)
Alkaline Phosphatase: 44 U/L (ref 33–130)
BUN: 22 mg/dL (ref 7–25)
CO2: 31 mmol/L (ref 20–31)
Calcium: 9.6 mg/dL (ref 8.6–10.4)
Chloride: 100 mmol/L (ref 98–110)
Creat: 1.02 mg/dL — ABNORMAL HIGH (ref 0.50–0.99)
GFR, EST NON AFRICAN AMERICAN: 56 mL/min — AB (ref >=60)
GFR, Est African American: 65 mL/min (ref >=60)
GLUCOSE: 83 mg/dL (ref 65–99)
POTASSIUM: 4.4 mmol/L (ref 3.5–5.3)
SODIUM: 138 mmol/L (ref 135–146)
Total Bilirubin: 0.7 mg/dL (ref 0.2–1.2)
Total Protein: 6.5 g/dL (ref 6.1–8.1)

## 2017-03-29 LAB — LIPID PANEL
CHOL/HDL RATIO: 2 ratio (ref <5.0)
CHOLESTEROL: 181 mg/dL (ref <200)
HDL: 90 mg/dL (ref >50)
LDL Cholesterol: 75 mg/dL (ref <100)
Triglycerides: 81 mg/dL (ref <150)
VLDL: 16 mg/dL (ref <30)

## 2017-03-29 NOTE — Assessment & Plan Note (Signed)
USPSTF grade A and B recommendations reviewed with patient; age-appropriate recommendations, preventive care, screening tests, etc discussed and encouraged; healthy living encouraged; see AVS for patient education given to patient  

## 2017-03-29 NOTE — Assessment & Plan Note (Signed)
Check labs 

## 2017-03-31 ENCOUNTER — Ambulatory Visit: Payer: Commercial Managed Care - HMO | Admitting: Family Medicine

## 2017-03-31 DIAGNOSIS — M5417 Radiculopathy, lumbosacral region: Secondary | ICD-10-CM | POA: Diagnosis not present

## 2017-03-31 DIAGNOSIS — M5136 Other intervertebral disc degeneration, lumbar region: Secondary | ICD-10-CM | POA: Diagnosis not present

## 2017-03-31 DIAGNOSIS — M9903 Segmental and somatic dysfunction of lumbar region: Secondary | ICD-10-CM | POA: Diagnosis not present

## 2017-03-31 DIAGNOSIS — M9905 Segmental and somatic dysfunction of pelvic region: Secondary | ICD-10-CM | POA: Diagnosis not present

## 2017-04-03 DIAGNOSIS — M5136 Other intervertebral disc degeneration, lumbar region: Secondary | ICD-10-CM | POA: Diagnosis not present

## 2017-04-03 DIAGNOSIS — M5417 Radiculopathy, lumbosacral region: Secondary | ICD-10-CM | POA: Diagnosis not present

## 2017-04-03 DIAGNOSIS — M9905 Segmental and somatic dysfunction of pelvic region: Secondary | ICD-10-CM | POA: Diagnosis not present

## 2017-04-03 DIAGNOSIS — M9903 Segmental and somatic dysfunction of lumbar region: Secondary | ICD-10-CM | POA: Diagnosis not present

## 2017-04-05 DIAGNOSIS — M5136 Other intervertebral disc degeneration, lumbar region: Secondary | ICD-10-CM | POA: Diagnosis not present

## 2017-04-05 DIAGNOSIS — M5417 Radiculopathy, lumbosacral region: Secondary | ICD-10-CM | POA: Diagnosis not present

## 2017-04-05 DIAGNOSIS — M9905 Segmental and somatic dysfunction of pelvic region: Secondary | ICD-10-CM | POA: Diagnosis not present

## 2017-04-05 DIAGNOSIS — M9903 Segmental and somatic dysfunction of lumbar region: Secondary | ICD-10-CM | POA: Diagnosis not present

## 2017-04-10 DIAGNOSIS — M9903 Segmental and somatic dysfunction of lumbar region: Secondary | ICD-10-CM | POA: Diagnosis not present

## 2017-04-10 DIAGNOSIS — M5417 Radiculopathy, lumbosacral region: Secondary | ICD-10-CM | POA: Diagnosis not present

## 2017-04-10 DIAGNOSIS — M9905 Segmental and somatic dysfunction of pelvic region: Secondary | ICD-10-CM | POA: Diagnosis not present

## 2017-04-10 DIAGNOSIS — M5136 Other intervertebral disc degeneration, lumbar region: Secondary | ICD-10-CM | POA: Diagnosis not present

## 2017-04-12 DIAGNOSIS — M5136 Other intervertebral disc degeneration, lumbar region: Secondary | ICD-10-CM | POA: Diagnosis not present

## 2017-04-12 DIAGNOSIS — M9903 Segmental and somatic dysfunction of lumbar region: Secondary | ICD-10-CM | POA: Diagnosis not present

## 2017-04-12 DIAGNOSIS — M5417 Radiculopathy, lumbosacral region: Secondary | ICD-10-CM | POA: Diagnosis not present

## 2017-04-12 DIAGNOSIS — M9905 Segmental and somatic dysfunction of pelvic region: Secondary | ICD-10-CM | POA: Diagnosis not present

## 2017-04-24 ENCOUNTER — Other Ambulatory Visit: Payer: Self-pay

## 2017-04-24 MED ORDER — RANITIDINE HCL 150 MG PO TABS: 150.0000 mg | ORAL_TABLET | Freq: Two times a day (BID) | ORAL | 3 refills | Status: DC | PRN

## 2017-04-24 NOTE — Telephone Encounter (Signed)
She is already taking omeprazole every other day.  She is will try ranatidine please rx.

## 2017-04-24 NOTE — Telephone Encounter (Signed)
Thank you; Rx for H2 blocker sent

## 2017-04-24 NOTE — Telephone Encounter (Signed)
Please let pt know that unless she has Barrett's esophagus or ulcers, we'd really like to wean down her PPI use Does she have either conditon? If not, let's encourage her to decrease use gradually; skip a dose on Mondays and Fridays for a few weeks, then skip Mon-Wed-Fri for a few weeks, then try to stop She can use ranitidine (Zantac) 150 mg BID instead; that is over -the-counter but we can write that as a Rx if she'd prefer Avoid triggers, elevate HOB, etc.

## 2017-04-25 DIAGNOSIS — M5136 Other intervertebral disc degeneration, lumbar region: Secondary | ICD-10-CM | POA: Diagnosis not present

## 2017-04-25 DIAGNOSIS — M9905 Segmental and somatic dysfunction of pelvic region: Secondary | ICD-10-CM | POA: Diagnosis not present

## 2017-04-25 DIAGNOSIS — M9903 Segmental and somatic dysfunction of lumbar region: Secondary | ICD-10-CM | POA: Diagnosis not present

## 2017-04-25 DIAGNOSIS — M5417 Radiculopathy, lumbosacral region: Secondary | ICD-10-CM | POA: Diagnosis not present

## 2017-04-26 ENCOUNTER — Other Ambulatory Visit: Payer: Self-pay

## 2017-04-26 NOTE — Telephone Encounter (Signed)
Please let the pt know that prolonged use of the PPI may increase risk of pneumonia, colitis, osteoporosis, anemia I'd like her to stop the omeprazole and try twice a day ranitidine instead. Have her avoid triggers such as green peppers, red peppers, onions, spicy food, and acidic foods and elevate the head of her bed and don't eat for 2-3 hours before bedtime.

## 2017-05-17 DIAGNOSIS — M5136 Other intervertebral disc degeneration, lumbar region: Secondary | ICD-10-CM | POA: Diagnosis not present

## 2017-05-17 DIAGNOSIS — M5417 Radiculopathy, lumbosacral region: Secondary | ICD-10-CM | POA: Diagnosis not present

## 2017-05-17 DIAGNOSIS — M9903 Segmental and somatic dysfunction of lumbar region: Secondary | ICD-10-CM | POA: Diagnosis not present

## 2017-05-17 DIAGNOSIS — M9905 Segmental and somatic dysfunction of pelvic region: Secondary | ICD-10-CM | POA: Diagnosis not present

## 2017-05-24 ENCOUNTER — Other Ambulatory Visit: Payer: Self-pay | Admitting: Family Medicine

## 2017-05-24 DIAGNOSIS — Z1231 Encounter for screening mammogram for malignant neoplasm of breast: Secondary | ICD-10-CM

## 2017-06-03 ENCOUNTER — Other Ambulatory Visit: Payer: Self-pay | Admitting: Family Medicine

## 2017-06-05 NOTE — Telephone Encounter (Signed)
Please let pt know that her kidney function declined a little on the last check, so I'd like to replace the diclofenac with plain tylenol or acetaminophen plus turmeric; long-term diclofenac and other NSAIDs can result in kidney damage; thank you

## 2017-06-05 NOTE — Telephone Encounter (Signed)
Patient notified

## 2017-06-22 ENCOUNTER — Telehealth: Payer: Self-pay | Admitting: Family Medicine

## 2017-06-22 ENCOUNTER — Encounter: Payer: Self-pay | Admitting: Family Medicine

## 2017-06-22 DIAGNOSIS — M8580 Other specified disorders of bone density and structure, unspecified site: Secondary | ICD-10-CM

## 2017-06-22 HISTORY — DX: Other specified disorders of bone density and structure, unspecified site: M85.80

## 2017-06-22 NOTE — Telephone Encounter (Signed)
-----   Message from Arnetha Courser, MD sent at 03/27/2017  2:05 PM EDT ----- Regarding: order dexa and mammo July 2018 Order , patient to schedule

## 2017-06-22 NOTE — Assessment & Plan Note (Signed)
Order DEXA 

## 2017-06-22 NOTE — Telephone Encounter (Signed)
Patient is due for mammogram and DEXA scan Mammo orders already in system I just ordered the DEXA Please let pt know how to schedule

## 2017-06-23 NOTE — Telephone Encounter (Signed)
Left detailed voicemail to call and setup what works for her with phone number and to call us if any problems

## 2017-07-10 ENCOUNTER — Ambulatory Visit
Admission: RE | Admit: 2017-07-10 | Discharge: 2017-07-10 | Disposition: A | Discharge status: Home / Self Care | Payer: Commercial Managed Care - HMO | Source: Ambulatory Visit | Attending: Family Medicine | Admitting: Family Medicine

## 2017-07-10 ENCOUNTER — Telehealth: Payer: Self-pay | Admitting: Family Medicine

## 2017-07-10 DIAGNOSIS — Z1231 Encounter for screening mammogram for malignant neoplasm of breast: Secondary | ICD-10-CM

## 2017-07-10 NOTE — Telephone Encounter (Signed)
Pt had mammogram this morning and due to her symptoms they need a order for a diagnostic mammogram. Pt did get mammogram done, however [place] has advised her to get a diagnostic mammogram and a sonogram. Pt is having tenderness in right breast.

## 2017-07-10 NOTE — Telephone Encounter (Signed)
Patient notified would need an appt to determine what location.  Appt scheduled

## 2017-07-11 ENCOUNTER — Ambulatory Visit (INDEPENDENT_AMBULATORY_CARE_PROVIDER_SITE_OTHER): Payer: Medicare HMO | Admitting: Family Medicine

## 2017-07-11 ENCOUNTER — Encounter: Payer: Self-pay | Admitting: Family Medicine

## 2017-07-11 VITALS — BP 130/72 | HR 85 | Temp 98.0°F | Resp 14 | Wt 139.8 lb

## 2017-07-11 DIAGNOSIS — N644 Mastodynia: Secondary | ICD-10-CM | POA: Diagnosis not present

## 2017-07-11 DIAGNOSIS — M47817 Spondylosis without myelopathy or radiculopathy, lumbosacral region: Secondary | ICD-10-CM | POA: Diagnosis not present

## 2017-07-11 DIAGNOSIS — Z13820 Encounter for screening for osteoporosis: Secondary | ICD-10-CM

## 2017-07-11 DIAGNOSIS — N182 Chronic kidney disease, stage 2 (mild): Secondary | ICD-10-CM | POA: Insufficient documentation

## 2017-07-11 DIAGNOSIS — N183 Chronic kidney disease, stage 3 unspecified: Secondary | ICD-10-CM

## 2017-07-11 DIAGNOSIS — M8580 Other specified disorders of bone density and structure, unspecified site: Secondary | ICD-10-CM

## 2017-07-11 HISTORY — DX: Chronic kidney disease, stage 2 (mild): N18.2

## 2017-07-11 NOTE — Assessment & Plan Note (Signed)
Recheck DEXA scan 

## 2017-07-11 NOTE — Patient Instructions (Addendum)
We'll get breast imaging Let's try Aleve 220 mg every twelve hours if needed (naproxen) Recheck kidneys in one month (no appointment needed) Okay to use tylenol per package directions Topicals and temperature and turmeric

## 2017-07-11 NOTE — Assessment & Plan Note (Signed)
Patient having increased pain off of the NSAID; she wishes to do something for pain; discussed risk of long-term NSAIDs, CKD; we mutually agreed to let her try 220 mg naproxen sodium, lower cardiac risk (though still present) of the class; recheck BMP in one month; suggested turmeric, topicals, therapy, TENS units, etc

## 2017-07-11 NOTE — Progress Notes (Signed)
BP 130/72   Pulse 85   Temp 98 F (36.7 C) (Oral)   Resp 14   Wt 139 lb 12.8 oz (63.4 kg)   SpO2 97%   BMI 24.76 kg/m    Subjective:    Patient ID: Rachel James, female    DOB: 21-Aug-1946, 71 y.o.   MRN: [id number]  HPI: Rachel James is a 71 y.o. female  Chief Complaint  Patient presents with  . Breast Pain    right,  needs diagnostic mammogram     HPI She has been having discomfort in both breasts, just when pressing on it; RIGHT worse than left, lateral aspect No nipple discharge Has not really looked at skin No nipple retraction Has not checked armpit Has fibrocytstic changes Hx of breast biopsy years ago, fatty tumor removed from the right breast, [place]  No cancer at all in the family  She has stopped the NSAID because of decrease in GFR; however, she is really bothered with pain; wondering what else she can do for her pain; we reviewed her last 3 creatinine and GFR readings  Depression screen [facility] 2/9 07/11/2017 03/27/2017 08/16/2016 07/20/2015 05/27/2015  Decreased Interest 0 0 0 0 0  Down, Depressed, Hopeless 0 0 0 0 0  PHQ - 2 Score 0 0 0 0 0   Relevant past medical, surgical, family and social history reviewed Past Medical History:  Diagnosis Date  . Allergy   . Arthritis   . Hypertension   . Osteopenia 06/22/2017   June 2016 DEXA   Past Surgical History:  Procedure Laterality Date  .  THUMB SURGERY Left   . BREAST LUMPECTOMY Right   . BUNIONECTOMY Bilateral   . CERVICAL DISCECTOMY  09/18/2012   C4-C5 AND C6-C7 ACDF   . TUBAL LIGATION Bilateral    Family History  Problem Relation Age of Onset  . Heart disease Mother   . Heart attack Mother   . Heart disease Father   . Heart disease Sister   . Heart disease Brother   . Heart failure Brother   . Memory loss Brother   . Diabetes Paternal Grandmother   . Pulmonary embolism Sister    Social History   Social History  . Marital status: Unknown    Spouse name: N/A  . Number of  children: N/A  . Years of education: N/A   Occupational History  . retired    Social History Main Topics  . Smoking status: Never Smoker  . Smokeless tobacco: Never Used  . Alcohol use 0.0 oz/week     Comment: maybe 3 drinks per month  . Drug use: No  . Sexual activity: Not Currently   Other Topics Concern  . Not on file   Social History Narrative  . No narrative on file    Interim medical history since last visit reviewed. Allergies and medications reviewed  Review of Systems Per HPI unless specifically indicated above     Objective:    BP 130/72   Pulse 85   Temp 98 F (36.7 C) (Oral)   Resp 14   Wt 139 lb 12.8 oz (63.4 kg)   SpO2 97%   BMI 24.76 kg/m   Wt Readings from Last 3 Encounters:  07/11/17 139 lb 12.8 oz (63.4 kg)  03/27/17 138 lb 4.8 oz (62.7 kg)  09/08/16 136 lb (61.7 kg)    Physical Exam  Constitutional: She appears well-developed and well-nourished.  HENT:  Mouth/Throat: Mucous membranes  are normal.  Eyes: EOM are normal. No scleral icterus.  Cardiovascular: Normal rate and regular rhythm.   Pulmonary/Chest: Effort normal and breath sounds normal. Right breast exhibits mass (fullness more than discrete mass) and tenderness. Right breast exhibits no inverted nipple, no nipple discharge and no skin change. Left breast exhibits mass (fullness more than discrete mass, left more appreciable than right) and tenderness. Left breast exhibits no inverted nipple, no nipple discharge and no skin change.    Lymphadenopathy:    She has no axillary adenopathy.       Right axillary: No pectoral and no lateral adenopathy present.       Left axillary: No pectoral and no lateral adenopathy present. Skin: No rash noted.  No skin changes over the breasts, no peau d'orange changes, no thickening or erythema over either breast  Psychiatric: She has a normal mood and affect. Her behavior is normal. Her mood appears not anxious.    Results for orders placed or  performed in visit on 03/27/17  COMPLETE METABOLIC PANEL WITH GFR  Result Value Ref Range   Sodium 138 135 - 146 mmol/L   Potassium 4.4 3.5 - 5.3 mmol/L   Chloride 100 98 - 110 mmol/L   CO2 31 20 - 31 mmol/L   Glucose, Bld 83 65 - 99 mg/dL   BUN 22 7 - 25 mg/dL   Creat 1.02 (H) 0.50 - 0.99 mg/dL   Total Bilirubin 0.7 0.2 - 1.2 mg/dL   Alkaline Phosphatase 44 33 - 130 U/L   AST 23 10 - 35 U/L   ALT 21 6 - 29 U/L   Total Protein 6.5 6.1 - 8.1 g/dL   Albumin 4.1 3.6 - 5.1 g/dL   Calcium 9.6 8.6 - 10.4 mg/dL   GFR, Est African American 65 >=60 mL/min   GFR, Est Non African American 56 (L) >=60 mL/min  Lipid panel  Result Value Ref Range   Cholesterol 181 <200 mg/dL   Triglycerides 81 <150 mg/dL   HDL 90 >50 mg/dL   Total CHOL/HDL Ratio 2.0 <5.0 Ratio   VLDL 16 <30 mg/dL   LDL Cholesterol 75 <100 mg/dL      Assessment & Plan:   Problem List Items Addressed This Visit      Musculoskeletal and Integument   Osteopenia    Recheck DEXA scan      Relevant Orders   DG Bone Density   Lumbar and sacral osteoarthritis    Patient having increased pain off of the NSAID; she wishes to do something for pain; discussed risk of long-term NSAIDs, CKD; we mutually agreed to let her try 220 mg naproxen sodium, lower cardiac risk (though still present) of the class; recheck BMP in one month; suggested turmeric, topicals, therapy, TENS units, etc        Genitourinary   CKD (chronic kidney disease) stage 3, GFR 30-59 ml/min    Check BMP after one month of low dose NSAID use; hydrate well      Relevant Orders   Basic Metabolic Panel (BMET)    Other Visit Diagnoses    Breast tenderness in female    -  Primary   physical findings suggestive of fibrocystic breast disease given symmetry, but need imaging to r/o cancer; diag mammo plus bilat [place] ordered   Relevant Orders   US BREAST LTD UNI RIGHT INC AXILLA   US BREAST LTD UNI LEFT INC AXILLA   MM DIAG BREAST TOMO BILATERAL   Screening for  osteoporosis           Follow up plan: No Follow-up on file.  An after-visit summary was printed and given to the patient at [place].  Please see the patient instructions which may contain other information and recommendations beyond what is mentioned above in the assessment and plan.  No orders of the defined types were placed in this encounter.   Orders Placed This Encounter  Procedures  . DG Bone Density  . US BREAST LTD UNI RIGHT INC AXILLA  . US BREAST LTD UNI LEFT INC AXILLA  . MM DIAG BREAST TOMO BILATERAL  . Basic Metabolic Panel (BMET)

## 2017-07-11 NOTE — Assessment & Plan Note (Signed)
Check BMP after one month of low dose NSAID use; hydrate well

## 2017-07-12 ENCOUNTER — Telehealth: Payer: Self-pay

## 2017-07-12 DIAGNOSIS — M5136 Other intervertebral disc degeneration, lumbar region: Secondary | ICD-10-CM | POA: Diagnosis not present

## 2017-07-12 DIAGNOSIS — M9903 Segmental and somatic dysfunction of lumbar region: Secondary | ICD-10-CM | POA: Diagnosis not present

## 2017-07-12 DIAGNOSIS — M9905 Segmental and somatic dysfunction of pelvic region: Secondary | ICD-10-CM | POA: Diagnosis not present

## 2017-07-12 DIAGNOSIS — M5417 Radiculopathy, lumbosacral region: Secondary | ICD-10-CM | POA: Diagnosis not present

## 2017-07-12 NOTE — Telephone Encounter (Signed)
I contacted this patient to inform her that she has been scheduled for her bilateral US on Thursday, July 20, 2017 @ 11am, but the patient stated that she will be going out of town on the 1st and will not be able to make it. She was given the number to their office [phone] to reschedule.

## 2017-07-18 ENCOUNTER — Ambulatory Visit
Admission: RE | Admit: 2017-07-18 | Discharge: 2017-07-18 | Disposition: A | Discharge status: Home / Self Care | Payer: Commercial Managed Care - HMO | Source: Ambulatory Visit | Attending: Family Medicine | Admitting: Family Medicine

## 2017-07-18 DIAGNOSIS — R928 Other abnormal and inconclusive findings on diagnostic imaging of breast: Secondary | ICD-10-CM | POA: Diagnosis not present

## 2017-07-18 DIAGNOSIS — N644 Mastodynia: Secondary | ICD-10-CM | POA: Diagnosis not present

## 2017-07-18 DIAGNOSIS — N6489 Other specified disorders of breast: Secondary | ICD-10-CM | POA: Diagnosis not present

## 2017-07-20 ENCOUNTER — Other Ambulatory Visit: Payer: Commercial Managed Care - HMO

## 2017-08-06 DIAGNOSIS — M5432 Sciatica, left side: Secondary | ICD-10-CM | POA: Insufficient documentation

## 2017-08-11 ENCOUNTER — Telehealth: Payer: Self-pay | Admitting: Family Medicine

## 2017-08-11 ENCOUNTER — Other Ambulatory Visit: Payer: Self-pay

## 2017-08-11 DIAGNOSIS — M5136 Other intervertebral disc degeneration, lumbar region: Secondary | ICD-10-CM | POA: Diagnosis not present

## 2017-08-11 DIAGNOSIS — M9905 Segmental and somatic dysfunction of pelvic region: Secondary | ICD-10-CM | POA: Diagnosis not present

## 2017-08-11 DIAGNOSIS — M5417 Radiculopathy, lumbosacral region: Secondary | ICD-10-CM | POA: Diagnosis not present

## 2017-08-11 DIAGNOSIS — N183 Chronic kidney disease, stage 3 unspecified: Secondary | ICD-10-CM

## 2017-08-11 DIAGNOSIS — M9903 Segmental and somatic dysfunction of lumbar region: Secondary | ICD-10-CM | POA: Diagnosis not present

## 2017-08-11 MED ORDER — GABAPENTIN 300 MG PO CAPS: ORAL_CAPSULE | ORAL | 0 refills | Status: DC

## 2017-08-11 MED ORDER — PREDNISONE 10 MG PO TABS: ORAL_TABLET | ORAL | 0 refills | Status: DC

## 2017-08-11 NOTE — Telephone Encounter (Signed)
I returned her call She has several diagnoses in her chart related to her back [facility]; had some shots in her back a few years ago, helped for a short time Saw another doctor at [place], Dr. Sharlet Salina at [place] too; those didn't help at all She has not gone back No B/B dysfunction I offered steroids by mouth Avoid NSAIDs while on steroid (ibuprofen, Aleve) Okay to take tylenol I offered gabapentin for the nerve pain Contact specialist if not getting better Took half of a pill of hydrocodone left over from dentist She could use tizanidine only if spasms

## 2017-08-11 NOTE — Telephone Encounter (Signed)
Patient stated that she having sciatica nerve pain again.  She stated that she took 1/2 of hydrocodone last night and it seemed to help, but she doesn't really like taking narcotics. Patient stated she had tried taking over the counter tylenol but it doesn't work.  Please advise.

## 2017-08-12 LAB — BASIC METABOLIC PANEL
BUN: 14 mg/dL (ref 7–25)
CO2: 24 mmol/L (ref 20–32)
CREATININE: 0.81 mg/dL (ref 0.50–0.99)
Calcium: 9.5 mg/dL (ref 8.6–10.4)
Chloride: 98 mmol/L (ref 98–110)
Glucose, Bld: 83 mg/dL (ref 65–99)
POTASSIUM: 4.5 mmol/L (ref 3.5–5.3)
Sodium: 134 mmol/L — ABNORMAL LOW (ref 135–146)

## 2017-08-14 DIAGNOSIS — M5417 Radiculopathy, lumbosacral region: Secondary | ICD-10-CM | POA: Diagnosis not present

## 2017-08-14 DIAGNOSIS — M5136 Other intervertebral disc degeneration, lumbar region: Secondary | ICD-10-CM | POA: Diagnosis not present

## 2017-08-14 DIAGNOSIS — M9905 Segmental and somatic dysfunction of pelvic region: Secondary | ICD-10-CM | POA: Diagnosis not present

## 2017-08-14 DIAGNOSIS — M9903 Segmental and somatic dysfunction of lumbar region: Secondary | ICD-10-CM | POA: Diagnosis not present

## 2017-08-15 DIAGNOSIS — L821 Other seborrheic keratosis: Secondary | ICD-10-CM | POA: Diagnosis not present

## 2017-08-15 DIAGNOSIS — X32XXXA Exposure to sunlight, initial encounter: Secondary | ICD-10-CM | POA: Diagnosis not present

## 2017-08-15 DIAGNOSIS — L570 Actinic keratosis: Secondary | ICD-10-CM | POA: Diagnosis not present

## 2017-08-16 DIAGNOSIS — M5136 Other intervertebral disc degeneration, lumbar region: Secondary | ICD-10-CM | POA: Diagnosis not present

## 2017-08-16 DIAGNOSIS — M9903 Segmental and somatic dysfunction of lumbar region: Secondary | ICD-10-CM | POA: Diagnosis not present

## 2017-08-16 DIAGNOSIS — M9905 Segmental and somatic dysfunction of pelvic region: Secondary | ICD-10-CM | POA: Diagnosis not present

## 2017-08-16 DIAGNOSIS — M5417 Radiculopathy, lumbosacral region: Secondary | ICD-10-CM | POA: Diagnosis not present

## 2017-08-18 DIAGNOSIS — M9905 Segmental and somatic dysfunction of pelvic region: Secondary | ICD-10-CM | POA: Diagnosis not present

## 2017-08-18 DIAGNOSIS — M5136 Other intervertebral disc degeneration, lumbar region: Secondary | ICD-10-CM | POA: Diagnosis not present

## 2017-08-18 DIAGNOSIS — M5417 Radiculopathy, lumbosacral region: Secondary | ICD-10-CM | POA: Diagnosis not present

## 2017-08-18 DIAGNOSIS — M9903 Segmental and somatic dysfunction of lumbar region: Secondary | ICD-10-CM | POA: Diagnosis not present

## 2017-08-23 DIAGNOSIS — M9903 Segmental and somatic dysfunction of lumbar region: Secondary | ICD-10-CM | POA: Diagnosis not present

## 2017-08-23 DIAGNOSIS — M5417 Radiculopathy, lumbosacral region: Secondary | ICD-10-CM | POA: Diagnosis not present

## 2017-08-23 DIAGNOSIS — M5136 Other intervertebral disc degeneration, lumbar region: Secondary | ICD-10-CM | POA: Diagnosis not present

## 2017-08-23 DIAGNOSIS — M9905 Segmental and somatic dysfunction of pelvic region: Secondary | ICD-10-CM | POA: Diagnosis not present

## 2017-08-28 DIAGNOSIS — M5417 Radiculopathy, lumbosacral region: Secondary | ICD-10-CM | POA: Diagnosis not present

## 2017-08-28 DIAGNOSIS — M9903 Segmental and somatic dysfunction of lumbar region: Secondary | ICD-10-CM | POA: Diagnosis not present

## 2017-08-28 DIAGNOSIS — M9905 Segmental and somatic dysfunction of pelvic region: Secondary | ICD-10-CM | POA: Diagnosis not present

## 2017-08-28 DIAGNOSIS — M5136 Other intervertebral disc degeneration, lumbar region: Secondary | ICD-10-CM | POA: Diagnosis not present

## 2017-08-29 DIAGNOSIS — M5136 Other intervertebral disc degeneration, lumbar region: Secondary | ICD-10-CM | POA: Diagnosis not present

## 2017-08-29 DIAGNOSIS — M1612 Unilateral primary osteoarthritis, left hip: Secondary | ICD-10-CM | POA: Diagnosis not present

## 2017-08-29 DIAGNOSIS — M48062 Spinal stenosis, lumbar region with neurogenic claudication: Secondary | ICD-10-CM | POA: Diagnosis not present

## 2017-08-29 DIAGNOSIS — M5416 Radiculopathy, lumbar region: Secondary | ICD-10-CM | POA: Diagnosis not present

## 2017-08-29 DIAGNOSIS — M47816 Spondylosis without myelopathy or radiculopathy, lumbar region: Secondary | ICD-10-CM | POA: Diagnosis not present

## 2017-09-04 DIAGNOSIS — M9903 Segmental and somatic dysfunction of lumbar region: Secondary | ICD-10-CM | POA: Diagnosis not present

## 2017-09-04 DIAGNOSIS — M5417 Radiculopathy, lumbosacral region: Secondary | ICD-10-CM | POA: Diagnosis not present

## 2017-09-04 DIAGNOSIS — M9905 Segmental and somatic dysfunction of pelvic region: Secondary | ICD-10-CM | POA: Diagnosis not present

## 2017-09-04 DIAGNOSIS — M5136 Other intervertebral disc degeneration, lumbar region: Secondary | ICD-10-CM | POA: Diagnosis not present

## 2017-09-11 DIAGNOSIS — M9905 Segmental and somatic dysfunction of pelvic region: Secondary | ICD-10-CM | POA: Diagnosis not present

## 2017-09-11 DIAGNOSIS — M5417 Radiculopathy, lumbosacral region: Secondary | ICD-10-CM | POA: Diagnosis not present

## 2017-09-11 DIAGNOSIS — M9903 Segmental and somatic dysfunction of lumbar region: Secondary | ICD-10-CM | POA: Diagnosis not present

## 2017-09-11 DIAGNOSIS — M5136 Other intervertebral disc degeneration, lumbar region: Secondary | ICD-10-CM | POA: Diagnosis not present

## 2017-09-13 DIAGNOSIS — M9905 Segmental and somatic dysfunction of pelvic region: Secondary | ICD-10-CM | POA: Diagnosis not present

## 2017-09-13 DIAGNOSIS — M9903 Segmental and somatic dysfunction of lumbar region: Secondary | ICD-10-CM | POA: Diagnosis not present

## 2017-09-13 DIAGNOSIS — M5417 Radiculopathy, lumbosacral region: Secondary | ICD-10-CM | POA: Diagnosis not present

## 2017-09-13 DIAGNOSIS — M5136 Other intervertebral disc degeneration, lumbar region: Secondary | ICD-10-CM | POA: Diagnosis not present

## 2017-09-18 DIAGNOSIS — M5417 Radiculopathy, lumbosacral region: Secondary | ICD-10-CM | POA: Diagnosis not present

## 2017-09-18 DIAGNOSIS — M9903 Segmental and somatic dysfunction of lumbar region: Secondary | ICD-10-CM | POA: Diagnosis not present

## 2017-09-18 DIAGNOSIS — M5136 Other intervertebral disc degeneration, lumbar region: Secondary | ICD-10-CM | POA: Diagnosis not present

## 2017-09-18 DIAGNOSIS — M9905 Segmental and somatic dysfunction of pelvic region: Secondary | ICD-10-CM | POA: Diagnosis not present

## 2017-09-26 ENCOUNTER — Ambulatory Visit (INDEPENDENT_AMBULATORY_CARE_PROVIDER_SITE_OTHER): Payer: Medicare HMO | Admitting: Family Medicine

## 2017-09-26 ENCOUNTER — Ambulatory Visit
Admission: RE | Admit: 2017-09-26 | Discharge: 2017-09-26 | Disposition: A | Discharge status: Home / Self Care | Payer: Commercial Managed Care - HMO | Source: Ambulatory Visit | Attending: Family Medicine | Admitting: Family Medicine

## 2017-09-26 ENCOUNTER — Encounter: Payer: Self-pay | Admitting: Family Medicine

## 2017-09-26 VITALS — BP 110/72 | HR 71 | Temp 98.2°F | Resp 14 | Wt 141.2 lb

## 2017-09-26 DIAGNOSIS — J3489 Other specified disorders of nose and nasal sinuses: Secondary | ICD-10-CM

## 2017-09-26 DIAGNOSIS — M25511 Pain in right shoulder: Secondary | ICD-10-CM | POA: Insufficient documentation

## 2017-09-26 DIAGNOSIS — M5416 Radiculopathy, lumbar region: Secondary | ICD-10-CM

## 2017-09-26 DIAGNOSIS — M25471 Effusion, right ankle: Secondary | ICD-10-CM

## 2017-09-26 DIAGNOSIS — G8929 Other chronic pain: Secondary | ICD-10-CM | POA: Insufficient documentation

## 2017-09-26 DIAGNOSIS — M19011 Primary osteoarthritis, right shoulder: Secondary | ICD-10-CM | POA: Insufficient documentation

## 2017-09-26 DIAGNOSIS — Z23 Encounter for immunization: Secondary | ICD-10-CM | POA: Diagnosis not present

## 2017-09-26 DIAGNOSIS — K219 Gastro-esophageal reflux disease without esophagitis: Secondary | ICD-10-CM

## 2017-09-26 NOTE — Assessment & Plan Note (Signed)
Glad to hear that her sciatica and back issues are much improved; continue gabapentin, med list updated

## 2017-09-26 NOTE — Assessment & Plan Note (Signed)
Will get xrays and refer to PT; turmeric and tylenol and topicals

## 2017-09-26 NOTE — Progress Notes (Signed)
BP 110/72   Pulse 71   Temp 98.2 F (36.8 C) (Oral)   Resp 14   Wt 141 lb 3.2 oz (64 kg)   SpO2 96%   BMI 25.01 kg/m    Subjective:    Patient ID: Rachel James, female    DOB: 1946-08-10, 71 y.o.   MRN: [id number]  HPI: Rachel James is a 71 y.o. female  Chief Complaint  Patient presents with  . Shoulder Pain    right  . Edema    right foot and ankle   HPI Patient is here for an acute visit Back in 2002, she worked for [employer]; her job was constantly taking a piece of paper with her right arm and setting it in a rack behind her; she lived in [place] then This is not for worker's comp any more She is having achiness in her right shoulder She was diagnosed with impingement syndrome; orthopaedist saw her, was going to clean it out; by the time the surgery was approved, it had gotten better and surgery wasn't needed She did have injection in the [employer] on the right side makes it hurt Can't do some things in water aerobics She has to use little weights and pulling and pushing down with the weight, cannot do that due to pain Was having trouble curling her hair, abducting the right shoulder Last xrays were 2002 She would like to do PT for her shoulder  She saw Dr. Sharlet Salina; her sciatica is 95% gone; she is taking 300 mg gabapentin in the morning and 600 mg in the evening; the other side may be affected, some compensation  She also had trouble with her right ankle; not today though; was just puffy for a few days; not sure if she ate something salty; no redness, no increased heat  Off of PPI; not needing H2 blocker very often for her GERD  Something in her nose is hurting; does not think she scratched it; thought maybe a pimple; no bleeding, no fevers; no fever blisters, just canker sores on the inside  Depression screen [facility] 2/9 09/26/2017 07/11/2017 03/27/2017 08/16/2016 07/20/2015  Decreased Interest 0 0 0 0 0  Down, Depressed, Hopeless 0 0 0 0 0  PHQ - 2  Score 0 0 0 0 0   Relevant past medical, surgical, family and social history reviewed Past Medical History:  Diagnosis Date  . Allergy   . Arthritis   . Hypertension   . Osteopenia 06/22/2017   June 2016 DEXA   Past Surgical History:  Procedure Laterality Date  .  THUMB SURGERY Left   . BREAST BIOPSY Right 1992   neg  . BREAST LUMPECTOMY Right   . BUNIONECTOMY Bilateral   . CERVICAL DISCECTOMY  09/18/2012   C4-C5 AND C6-C7 ACDF   . TUBAL LIGATION Bilateral    Family History  Problem Relation Age of Onset  . Heart disease Mother   . Heart attack Mother   . Heart disease Father   . Heart disease Sister   . Heart disease Brother   . Heart failure Brother   . Memory loss Brother   . Diabetes Paternal Grandmother   . Pulmonary embolism Sister   . Breast cancer Neg Hx    Social History   Social History  . Marital status: Unknown    Spouse name: N/A  . Number of children: N/A  . Years of education: N/A   Occupational History  . retired  Social History Main Topics  . Smoking status: Never Smoker  . Smokeless tobacco: Never Used  . Alcohol use 0.0 oz/week     Comment: maybe 3 drinks per month  . Drug use: No  . Sexual activity: Not Currently   Other Topics Concern  . Not on file   Social History Narrative  . No narrative on file    Interim medical history since last visit reviewed. Allergies and medications reviewed  Review of Systems Per HPI unless specifically indicated above     Objective:    BP 110/72   Pulse 71   Temp 98.2 F (36.8 C) (Oral)   Resp 14   Wt 141 lb 3.2 oz (64 kg)   SpO2 96%   BMI 25.01 kg/m   Wt Readings from Last 3 Encounters:  09/26/17 141 lb 3.2 oz (64 kg)  07/11/17 139 lb 12.8 oz (63.4 kg)  03/27/17 138 lb 4.8 oz (62.7 kg)    Physical Exam  Constitutional: She appears well-developed and well-nourished.  HENT:  Nose: No mucosal edema or rhinorrhea. No epistaxis.  Mouth/Throat: Oropharynx is clear and moist and mucous  membranes are normal.  Mild erythema in the RIGHT anterior superior vestibule; no pustules, no vesicles  Eyes: EOM are normal. No scleral icterus.  Cardiovascular: Normal rate and regular rhythm.   Pulmonary/Chest: Effort normal and breath sounds normal.  Musculoskeletal: She exhibits no edema (no erythema or asymmetry of the legs).       Right shoulder: She exhibits decreased range of motion. She exhibits no swelling, no deformity and normal strength.  Psychiatric: She has a normal mood and affect. Her behavior is normal. Her mood appears not anxious. She does not exhibit a depressed mood.    Results for orders placed or performed in visit on 46/96/29  Basic Metabolic Panel (BMET)  Result Value Ref Range   Sodium 134 (L) 135 - 146 mmol/L   Potassium 4.5 3.5 - 5.3 mmol/L   Chloride 98 98 - 110 mmol/L   CO2 24 20 - 32 mmol/L   Glucose, Bld 83 65 - 99 mg/dL   BUN 14 7 - 25 mg/dL   Creat 0.81 0.50 - 0.99 mg/dL   Calcium 9.5 8.6 - 10.4 mg/dL      Assessment & Plan:   Problem List Items Addressed This Visit      Digestive   GERD (gastroesophageal reflux disease)    Well controlled off of the PPI (which has long-term risks); using H2 blocker when needed        Nervous and Auditory   Chronic radicular low back pain    Glad to hear that her sciatica and back issues are much improved; continue gabapentin, med list updated      Relevant Medications   gabapentin (NEURONTIN) 300 MG capsule     Other   Chronic right shoulder pain - Primary    Will get xrays and refer to PT; turmeric and tylenol and topicals      Relevant Medications   Acetaminophen 500 MG coapsule   naproxen sodium (ANAPROX) 220 MG tablet   gabapentin (NEURONTIN) 300 MG capsule   Other Relevant Orders   DG Shoulder Right   Ambulatory referral to Physical Therapy    Other Visit Diagnoses    Needs flu shot       Relevant Orders   Flu vaccine HIGH DOSE PF (Fluzone High dose) (Completed)   Nose pain        slight  irritation, recommended antimicrobial ointment; do not think this requires oral antibiotics; she'll take care when clipping hairs   Ankle swelling, right       NOT consistent with DVT; appears completely normal today; she'll pay attention to salt intake, positioning, etc; no other recs today       Follow up plan: Return in about 6 months (around 03/30/2018) for twenty minute follow-up with fasting labs.  An after-visit summary was printed and given to the patient at [facility].  Please see the patient instructions which may contain other information and recommendations beyond what is mentioned above in the assessment and plan.  Meds ordered this encounter  Medications  . Acetaminophen 500 MG coapsule    Sig: Place 500 mg into the left eye daily as needed.  . naproxen sodium (ANAPROX) 220 MG tablet    Sig: Take 220 mg by mouth 2 (two) times daily with a meal.  . gabapentin (NEURONTIN) 300 MG capsule    Sig: One by mouth every morning and two every evening    Orders Placed This Encounter  Procedures  . DG Shoulder Right  . Flu vaccine HIGH DOSE PF (Fluzone High dose)  . Ambulatory referral to Physical Therapy

## 2017-09-26 NOTE — Assessment & Plan Note (Signed)
Well controlled off of the PPI (which has long-term risks); using H2 blocker when needed

## 2017-09-26 NOTE — Patient Instructions (Addendum)
Please do call to schedule your bone density study; the number to schedule one at either [facility] or [facility] is [phone] or [phone] We'll have you see the physical therapist If not progressing, just let me know and we'll refer you to an orthopaedist Please have xrays done across the street Call if needed

## 2017-09-27 ENCOUNTER — Other Ambulatory Visit: Payer: Self-pay | Admitting: Family Medicine

## 2017-09-27 ENCOUNTER — Encounter: Payer: Self-pay | Admitting: Family Medicine

## 2017-09-27 DIAGNOSIS — M19011 Primary osteoarthritis, right shoulder: Secondary | ICD-10-CM | POA: Insufficient documentation

## 2017-09-27 NOTE — Assessment & Plan Note (Signed)
Refer to ortho.

## 2017-09-29 ENCOUNTER — Other Ambulatory Visit: Payer: Self-pay

## 2017-09-29 MED ORDER — GABAPENTIN 300 MG PO CAPS: ORAL_CAPSULE | ORAL | 1 refills | Status: DC

## 2017-10-02 ENCOUNTER — Ambulatory Visit: Payer: Commercial Managed Care - HMO | Attending: Family Medicine

## 2017-10-02 DIAGNOSIS — M6281 Muscle weakness (generalized): Secondary | ICD-10-CM

## 2017-10-02 DIAGNOSIS — G8929 Other chronic pain: Secondary | ICD-10-CM

## 2017-10-02 DIAGNOSIS — M25511 Pain in right shoulder: Secondary | ICD-10-CM | POA: Insufficient documentation

## 2017-10-02 NOTE — Therapy (Signed)
[facility] [address] [place], [place], [address] Phone: [phone]   Fax:  [phone]  Physical Therapy Evaluation  Patient Details  Name: Rachel James MRN: [id number] Date of Birth: [birth year] Referring Provider: Dr. Sanda Klein  Encounter Date: 10/02/2017      PT End of Session - 10/02/17 1232    Visit Number 1   Number of Visits 9   Date for PT Re-Evaluation 10/30/17   Authorization Type G-code    Authorization Time Period 10   Authorization - Visit Number 1   PT Start Time 1117   PT Stop Time 1214   PT Time Calculation (min) 57 min   Activity Tolerance Patient tolerated treatment well;Patient limited by pain   Behavior During Therapy [facility] for tasks assessed/performed      Past Medical History:  Diagnosis Date  . Allergy   . Arthritis   . Hypertension   . Osteopenia 06/22/2017   June 2016 DEXA    Past Surgical History:  Procedure Laterality Date  .  THUMB SURGERY Left   . BREAST BIOPSY Right 1992   neg  . BREAST LUMPECTOMY Right   . BUNIONECTOMY Bilateral   . CERVICAL DISCECTOMY  09/18/2012   C4-C5 AND C6-C7 ACDF   . TUBAL LIGATION Bilateral     There were no vitals filed for this visit.       Subjective Assessment - 10/02/17 1121    Subjective Pt has not been to water aerobics class in the last 3 weeks due to shoulder pain. Carrying groceries has also been difficult. Pt has no pain at rest, but has increased pain with movement in multiple directions.   Pertinent History 71 y/o female with chronic recurrent R shoulder pain. Pt had had prior Dx of impingement syndrome and shoulder injection.in 2002 Pt was scheduled to have shoulder arthroscopy, but surgery was canceled due to symptoms resolving per documentation from visit with PCP on 09/26/17. R shoulder pain returned over the last 6-8 mo with increasing pain over the last several weeks. Pt has had difficulty with water aerobics downward pressing activities and  with doing her hair. Pt is unable to sleep on the R side due to pain. Pt is seeing chiropractor for LBP monthly, though pt reports her LBP with radiculopathy is 95% resolved at this time. Medical Hx includes cervical discectomy C4-5, C6-7 (09/18/2012), LBP with sciatica (resolved?), osteopenia (Dx 2016 with follow-up scan 10/26/17), HTN.   Limitations Lifting;House hold activities   How long can you sit comfortably? Unlimited   How long can you stand comfortably? Unlimited   How long can you walk comfortably? Unlimited   Diagnostic tests 10/26/17 Bone density scan scheduled. 07/18/17 Mammogram IMPRESSION: No evidence of malignancy within either breast. 09/27/17 X-ray IMPRESSION: Moderate glenohumeral osteoarthritis.   Patient Stated Goals Be able to get back to water aerobics   Currently in Pain? Yes   Pain Score 5    Pain Location Shoulder   Pain Orientation Right   Pain Descriptors / Indicators Sharp   Pain Type Chronic pain   Pain Radiating Towards Upper arm and elbow in the past   Pain Onset More than a month ago   Pain Frequency Intermittent   Aggravating Factors  Lifting, pushing, movement in gerneral    Pain Relieving Factors Rest, tylenol, heating pad   Effect of Pain on Daily Activities Decreased RUE activity   Multiple Pain Sites No  PROM/AROM:  Upper Extremity: Shoulder flexion 152 deg L, 144 deg R, ER 35 deg R, 79 deg L, IR 30 deg R, 35 deg L  (Pain at end rang on RUE in all planes)  STRENGTH:  Graded on a 0-5 scale Flexion R pain limited 3+, L 4+, elbow flexion/extension 4-  R, 4+  L, Grip 54# R, and 40# L   Sensory:  Light touch: Decreased in fingertips bilaterally following cervical fusion       Coordination:   No gross impairments functionally assessed  Observations:   Posture: Forwards shoulders and slight forward head and slumped posture in sitting.   SPECIAL TESTS:    Painful arc test: pain in R shoulder 80 deg to about 120 deg Empty can  test: + R Neer: + with posterior shoulder pain bilaterally  Resisted external rotation R + with pain in posterior shoulder Hawkins-Kennedy + R  FUNCTIONAL MOBILITY:   Transfers: Able to stand without UE use. Pt had LBP with transition supine to sit.    Gait: No gross abnormalities   OUTCOME MEASURES: TEST Outcome Interpretation  Quick Dash  41/100 Mild to moderate disability           Objective measurements completed on examination: See above findings.     Treatment:  There-ex:  Pt instructed in cane assisted R shoulder ER, and scaption in supine, standing low row resisted with yellow t-band, scapular retraction, and doorway pec stretch with handout given (see pt instructions for details).         PT Education - 10/02/17 1231    Education provided Yes   Education Details Plan of care, HEP, outcome results   Person(s) Educated Patient   Methods Explanation;Demonstration;Tactile cues;Verbal cues;Handout   Comprehension Verbalized understanding;Returned demonstration;Verbal cues required;Tactile cues required             PT Long Term Goals - 10/02/17 1318      PT LONG TERM GOAL #1   Title Patient will demonstrate adequate R shoulder AROM to be able to wash and manage hair and dress independently with pain less than 2/10.   Baseline Pt with pain 5/10 with hand behind head   Time 4   Period Weeks   Status New   Target Date 10/30/17     PT LONG TERM GOAL #2   Title Patient will improve Quick DASH score to < 20 points demonstrating minimal self-reported upper extremity disability.   Baseline 41% disability 10/15   Time 4   Period Weeks   Status New   Target Date 10/30/17     PT LONG TERM GOAL #3   Title Patient will increase BUE gross strength to 4+/5 as to improve functional strength for independent self care, andincreased ADL ability.   Baseline Pain with cleaning, self care, pushing/pulling   Time 4   Period Weeks   Status New   Target Date  10/30/17     PT LONG TERM GOAL #4   Title Pt will be able to tolerate return to water aerobics class or other exercise program without limitation from R shoulder pain.   Baseline Unable to attent aerobics class for past 3 weeks   Time 4   Period Weeks   Status New   Target Date 10/30/17                Plan - 10/02/17 1301    Clinical Impression Statement 71 y/o female presents to therapy with R shoulder pain that began 6-8  months ago. She has been treated for shoulder pain in the past with resolution of symptoms in 2002 with an injection. Pt cannot remember if she had PT at that time. Pt was found to have decreased strength and AROM in RUE compared to LUE with pain at end range in multiple planes. Pt was positive for test clusters for subacromial impingement and for rotator cuff tendinopathy on R. Pt has been unable to participate in water aerobics due to pain and has had difficulty with self-care involving overhead reaching and hand behind back. Pt will benefit from stretching and strengthening exercises to reduce her pain and improve her functional UE mobility   History and Personal Factors relevant to plan of care: (-) advanced age, chroniciy of symptoms, multiple systems involved (cervical fusion, R shoulder OA   Clinical Presentation Evolving   Clinical Presentation due to: Pt reports symptoms are getting worse   Clinical Decision Making Moderate   Rehab Potential Fair   PT Frequency 2x / week   PT Duration 4 weeks   PT Treatment/Interventions ADLs/Self Care Home Management;Aquatic Therapy;Biofeedback;Cryotherapy;Ultrasound;Neuromuscular re-education;Therapeutic exercise;Therapeutic activities;Patient/family education;Manual techniques;Taping;Dry needling;Passive range of motion   PT Next Visit Plan Re-assess HEP, consider STM, stretching progression, advance to red band for scapular retraction    PT Home Exercise Plan Scap retraction, low row, ER and scaption cane stretch,  doorway pec stretch.   Recommended Other Services Consider aquatic therapy    Consulted and Agree with Plan of Care Patient      Patient will benefit from skilled therapeutic intervention in order to improve the following deficits and impairments:  Decreased activity tolerance, Decreased endurance, Decreased range of motion, Decreased strength, Hypomobility, Increased fascial restricitons, Impaired perceived functional ability, Impaired UE functional use, Impaired sensation, Improper body mechanics, Postural dysfunction, Pain, Impaired tone  Visit Diagnosis: Chronic right shoulder pain - Plan: PT plan of care cert/re-cert  Muscle weakness (generalized) - Plan: PT plan of care cert/re-cert      G-Codes - 53/61/44 1023    Functional Assessment Tool Used (Outpatient Only) Quick DASH, clinical judgement, AROM/MMT, NPRS   Functional Limitation Carrying, moving and handling objects   Carrying, Moving and Handling Objects Current Status (R1540) At least 20 percent but less than 40 percent impaired, limited or restricted   Carrying, Moving and Handling Objects Goal Status (G8676) At least 1 percent but less than 20 percent impaired, limited or restricted       Problem List Patient Active Problem List   Diagnosis Date Noted  . Arthritis of right glenohumeral joint 09/27/2017  . Chronic right shoulder pain 09/26/2017  . CKD (chronic kidney disease) stage 3, GFR 30-59 ml/min (HCC) 07/11/2017  . Osteopenia 06/22/2017  . 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 Shoulder pain, bilateral 09/08/2016  . Need for prophylactic vaccination with Streptococcus pneumoniae (Pneumococcus) and Influenza vaccines 08/16/2016  . Preventative health care 08/16/2016  . Screen for STD (sexually transmitted disease) 08/16/2016  . Urinary frequency 08/16/2016  . Medication monitoring encounter 07/20/2016  . Cutaneous skin tags 04/18/2016  . Accidental fall 12/09/2015  . Hyperlipidemia LDL  goal <100 09/17/2015  . Need for immunization against influenza 09/17/2015  . Degenerative arthritis of lumbar spine with cord compression 07/16/2015  . Lumbar and sacral osteoarthritis 07/16/2015  . Bilateral change in hearing 05/27/2015  . Spinal stenosis, multilevel 05/27/2015  . GERD (gastroesophageal reflux disease) 05/27/2015  . Chronic radicular low back pain 05/27/2015  . Allergic rhinitis 01/06/2014  . Osteoarthrosis  involving multiple sites 01/06/2014  . Arthropathia 01/06/2014  . Hypertension goal BP (blood pressure) < 140/90 01/06/2014   This entire session was performed under direct supervision and direction of a licensed therapist/therapist assistant . I have personally read, edited and approve of the note as written.   Burnett Corrente, SPT Phillips Grout PT, DPT   10/03/2017, 10:26 AM  [place] [facility] [address] [place], [place], [address] Phone: [phone]   Fax:  [phone]  Name: BLISS BEHNKE MRN: [id number] Date of Birth: 1946/08/25

## 2017-10-05 ENCOUNTER — Encounter: Payer: Commercial Managed Care - HMO | Admitting: Physical Therapy

## 2017-10-11 DIAGNOSIS — M7581 Other shoulder lesions, right shoulder: Secondary | ICD-10-CM | POA: Diagnosis not present

## 2017-10-11 DIAGNOSIS — M19011 Primary osteoarthritis, right shoulder: Secondary | ICD-10-CM | POA: Diagnosis not present

## 2017-10-11 DIAGNOSIS — M75101 Unspecified rotator cuff tear or rupture of right shoulder, not specified as traumatic: Secondary | ICD-10-CM | POA: Diagnosis not present

## 2017-10-19 DIAGNOSIS — M1612 Unilateral primary osteoarthritis, left hip: Secondary | ICD-10-CM | POA: Diagnosis not present

## 2017-10-22 ENCOUNTER — Other Ambulatory Visit: Payer: Self-pay | Admitting: Family Medicine

## 2017-10-22 DIAGNOSIS — E785 Hyperlipidemia, unspecified: Secondary | ICD-10-CM

## 2017-10-23 NOTE — Telephone Encounter (Signed)
Requests for BP med and statin Reviewed last BMP; approved Diclofenac denied; she has anaprox on med list; should not be on two NSAIDs

## 2017-10-26 ENCOUNTER — Ambulatory Visit
Admission: RE | Admit: 2017-10-26 | Discharge: 2017-10-26 | Disposition: A | Discharge status: Home / Self Care | Payer: Medicare HMO | Source: Ambulatory Visit | Attending: Family Medicine | Admitting: Family Medicine

## 2017-10-26 ENCOUNTER — Other Ambulatory Visit: Payer: Self-pay | Admitting: Surgery

## 2017-10-26 DIAGNOSIS — M85851 Other specified disorders of bone density and structure, right thigh: Secondary | ICD-10-CM | POA: Insufficient documentation

## 2017-10-26 DIAGNOSIS — M85832 Other specified disorders of bone density and structure, left forearm: Secondary | ICD-10-CM | POA: Diagnosis not present

## 2017-10-26 DIAGNOSIS — M7581 Other shoulder lesions, right shoulder: Secondary | ICD-10-CM

## 2017-10-26 DIAGNOSIS — M8580 Other specified disorders of bone density and structure, unspecified site: Secondary | ICD-10-CM | POA: Diagnosis not present

## 2017-10-26 DIAGNOSIS — M19011 Primary osteoarthritis, right shoulder: Secondary | ICD-10-CM

## 2017-10-26 DIAGNOSIS — M75101 Unspecified rotator cuff tear or rupture of right shoulder, not specified as traumatic: Secondary | ICD-10-CM

## 2017-10-26 DIAGNOSIS — M8589 Other specified disorders of bone density and structure, multiple sites: Secondary | ICD-10-CM | POA: Diagnosis not present

## 2017-10-29 ENCOUNTER — Ambulatory Visit
Admission: RE | Admit: 2017-10-29 | Discharge: 2017-10-29 | Disposition: A | Discharge status: Home / Self Care | Payer: Medicare HMO | Source: Ambulatory Visit | Attending: Surgery | Admitting: Surgery

## 2017-10-29 DIAGNOSIS — M7581 Other shoulder lesions, right shoulder: Secondary | ICD-10-CM

## 2017-10-29 DIAGNOSIS — M25511 Pain in right shoulder: Secondary | ICD-10-CM | POA: Diagnosis not present

## 2017-10-29 DIAGNOSIS — M19011 Primary osteoarthritis, right shoulder: Secondary | ICD-10-CM

## 2017-10-29 DIAGNOSIS — M75101 Unspecified rotator cuff tear or rupture of right shoulder, not specified as traumatic: Secondary | ICD-10-CM

## 2017-11-01 DIAGNOSIS — S46101A Unspecified injury of muscle, fascia and tendon of long head of biceps, right arm, initial encounter: Secondary | ICD-10-CM | POA: Diagnosis not present

## 2017-11-01 DIAGNOSIS — M19011 Primary osteoarthritis, right shoulder: Secondary | ICD-10-CM | POA: Diagnosis not present

## 2017-11-01 DIAGNOSIS — M7581 Other shoulder lesions, right shoulder: Secondary | ICD-10-CM | POA: Diagnosis not present

## 2017-12-07 ENCOUNTER — Other Ambulatory Visit: Payer: Self-pay

## 2017-12-07 ENCOUNTER — Encounter: Payer: Self-pay | Admitting: *Deleted

## 2017-12-07 DIAGNOSIS — M48062 Spinal stenosis, lumbar region with neurogenic claudication: Secondary | ICD-10-CM | POA: Diagnosis not present

## 2017-12-07 DIAGNOSIS — M5416 Radiculopathy, lumbar region: Secondary | ICD-10-CM | POA: Diagnosis not present

## 2017-12-07 DIAGNOSIS — M5136 Other intervertebral disc degeneration, lumbar region: Secondary | ICD-10-CM | POA: Diagnosis not present

## 2017-12-19 HISTORY — PX: JOINT REPLACEMENT: SHX530

## 2017-12-20 ENCOUNTER — Ambulatory Visit
Admission: RE | Admit: 2017-12-20 | Discharge: 2017-12-20 | Disposition: A | Discharge status: Home / Self Care | Payer: PPO | Source: Ambulatory Visit | Attending: Surgery | Admitting: Surgery

## 2017-12-20 ENCOUNTER — Encounter
Admission: RE | Disposition: A | Discharge status: Home / Self Care | Payer: Self-pay | Source: Ambulatory Visit | Attending: Surgery

## 2017-12-20 ENCOUNTER — Ambulatory Visit: Payer: PPO | Admitting: Student in an Organized Health Care Education/Training Program

## 2017-12-20 DIAGNOSIS — M7581 Other shoulder lesions, right shoulder: Secondary | ICD-10-CM | POA: Diagnosis not present

## 2017-12-20 DIAGNOSIS — K219 Gastro-esophageal reflux disease without esophagitis: Secondary | ICD-10-CM | POA: Insufficient documentation

## 2017-12-20 DIAGNOSIS — I129 Hypertensive chronic kidney disease with stage 1 through stage 4 chronic kidney disease, or unspecified chronic kidney disease: Secondary | ICD-10-CM | POA: Insufficient documentation

## 2017-12-20 DIAGNOSIS — Z7982 Long term (current) use of aspirin: Secondary | ICD-10-CM | POA: Diagnosis not present

## 2017-12-20 DIAGNOSIS — S43491A Other sprain of right shoulder joint, initial encounter: Secondary | ICD-10-CM | POA: Insufficient documentation

## 2017-12-20 DIAGNOSIS — M75111 Incomplete rotator cuff tear or rupture of right shoulder, not specified as traumatic: Secondary | ICD-10-CM | POA: Diagnosis not present

## 2017-12-20 DIAGNOSIS — N183 Chronic kidney disease, stage 3 (moderate): Secondary | ICD-10-CM | POA: Insufficient documentation

## 2017-12-20 DIAGNOSIS — M7541 Impingement syndrome of right shoulder: Secondary | ICD-10-CM | POA: Diagnosis present

## 2017-12-20 DIAGNOSIS — M25511 Pain in right shoulder: Secondary | ICD-10-CM | POA: Diagnosis not present

## 2017-12-20 DIAGNOSIS — S46101A Unspecified injury of muscle, fascia and tendon of long head of biceps, right arm, initial encounter: Secondary | ICD-10-CM | POA: Diagnosis not present

## 2017-12-20 DIAGNOSIS — S43431A Superior glenoid labrum lesion of right shoulder, initial encounter: Secondary | ICD-10-CM | POA: Diagnosis not present

## 2017-12-20 DIAGNOSIS — Z79899 Other long term (current) drug therapy: Secondary | ICD-10-CM | POA: Insufficient documentation

## 2017-12-20 DIAGNOSIS — M19011 Primary osteoarthritis, right shoulder: Secondary | ICD-10-CM | POA: Insufficient documentation

## 2017-12-20 DIAGNOSIS — Z791 Long term (current) use of non-steroidal anti-inflammatories (NSAID): Secondary | ICD-10-CM | POA: Diagnosis not present

## 2017-12-20 DIAGNOSIS — M7521 Bicipital tendinitis, right shoulder: Secondary | ICD-10-CM | POA: Diagnosis not present

## 2017-12-20 DIAGNOSIS — G8918 Other acute postprocedural pain: Secondary | ICD-10-CM | POA: Diagnosis not present

## 2017-12-20 DIAGNOSIS — M75101 Unspecified rotator cuff tear or rupture of right shoulder, not specified as traumatic: Secondary | ICD-10-CM | POA: Diagnosis not present

## 2017-12-20 HISTORY — DX: Other intervertebral disc degeneration, lumbar region: M51.36

## 2017-12-20 HISTORY — DX: Nausea with vomiting, unspecified: Z98.890

## 2017-12-20 HISTORY — DX: Nausea with vomiting, unspecified: R11.2

## 2017-12-20 HISTORY — PX: SHOULDER ARTHROSCOPY WITH ROTATOR CUFF REPAIR AND OPEN BICEPS TENODESIS: SHX6677

## 2017-12-20 HISTORY — DX: Other intervertebral disc degeneration, lumbar region without mention of lumbar back pain or lower extremity pain: M51.369

## 2017-12-20 SURGERY — SHOULDER ARTHROSCOPY WITH ROTATOR CUFF REPAIR AND OPEN BICEPS TENODESIS
Anesthesia: Regional | Site: Shoulder | Laterality: Right | Wound class: Clean

## 2017-12-20 MED ORDER — SCOPOLAMINE 1 MG/3DAYS TD PT72
1.0000 | MEDICATED_PATCH | Freq: Once | TRANSDERMAL | Status: DC
  Administered 2017-12-20: 1.5 mg via TRANSDERMAL

## 2017-12-20 MED ORDER — LACTATED RINGERS IV SOLN: INTRAVENOUS | Status: DC

## 2017-12-20 MED ORDER — PROPOFOL 10 MG/ML IV BOLUS
INTRAVENOUS | Status: DC | PRN
  Administered 2017-12-20: 150 mg via INTRAVENOUS
  Administered 2017-12-20: 30 mg via INTRAVENOUS

## 2017-12-20 MED ORDER — GLYCOPYRROLATE 0.2 MG/ML IJ SOLN
INTRAMUSCULAR | Status: DC | PRN
  Administered 2017-12-20: 0.1 mg via INTRAVENOUS

## 2017-12-20 MED ORDER — OXYCODONE HCL 5 MG PO TABS: 5.0000 mg | ORAL_TABLET | Freq: Once | ORAL | Status: DC | PRN

## 2017-12-20 MED ORDER — FENTANYL CITRATE (PF) 100 MCG/2ML IJ SOLN
INTRAMUSCULAR | Status: DC | PRN
  Administered 2017-12-20: 50 ug via INTRAVENOUS

## 2017-12-20 MED ORDER — DEXAMETHASONE SODIUM PHOSPHATE 4 MG/ML IJ SOLN
INTRAMUSCULAR | Status: DC | PRN
  Administered 2017-12-20: 4 mg via INTRAVENOUS

## 2017-12-20 MED ORDER — OXYCODONE HCL 5 MG/5ML PO SOLN: 5.0000 mg | Freq: Once | ORAL | Status: DC | PRN

## 2017-12-20 MED ORDER — ONDANSETRON HCL 4 MG/2ML IJ SOLN
INTRAMUSCULAR | Status: DC | PRN
  Administered 2017-12-20: 4 mg via INTRAVENOUS

## 2017-12-20 MED ORDER — EPHEDRINE SULFATE 50 MG/ML IJ SOLN
INTRAMUSCULAR | Status: DC | PRN
  Administered 2017-12-20: 10 mg via INTRAVENOUS

## 2017-12-20 MED ORDER — FENTANYL CITRATE (PF) 100 MCG/2ML IJ SOLN: 25.0000 ug | INTRAMUSCULAR | Status: DC | PRN

## 2017-12-20 MED ORDER — ONDANSETRON HCL 4 MG/2ML IJ SOLN: 4.0000 mg | Freq: Once | INTRAMUSCULAR | Status: DC | PRN

## 2017-12-20 MED ORDER — LACTATED RINGERS IV SOLN
INTRAVENOUS | Status: DC
  Administered 2017-12-20: 13:00:00 via INTRAVENOUS

## 2017-12-20 MED ORDER — CEFAZOLIN SODIUM-DEXTROSE 2-4 GM/100ML-% IV SOLN
2.0000 g | Freq: Once | INTRAVENOUS | Status: AC
  Administered 2017-12-20: 2 g via INTRAVENOUS

## 2017-12-20 MED ORDER — MIDAZOLAM HCL 5 MG/5ML IJ SOLN
INTRAMUSCULAR | Status: DC | PRN
  Administered 2017-12-20: 1 mg via INTRAVENOUS

## 2017-12-20 MED ORDER — HYDROCODONE-ACETAMINOPHEN 5-325 MG PO TABS: 1.0000 | ORAL_TABLET | Freq: Four times a day (QID) | ORAL | 0 refills | Status: DC | PRN

## 2017-12-20 MED ORDER — LIDOCAINE HCL (CARDIAC) 20 MG/ML IV SOLN
INTRAVENOUS | Status: DC | PRN
  Administered 2017-12-20: 30 mg via INTRATRACHEAL

## 2017-12-20 MED ORDER — LIDOCAINE-EPINEPHRINE (PF) 1 %-1:200000 IJ SOLN
INTRAMUSCULAR | Status: DC | PRN
  Administered 2017-12-20: 30 mL

## 2017-12-20 MED ORDER — ROPIVACAINE HCL 5 MG/ML IJ SOLN
INTRAMUSCULAR | Status: DC | PRN
  Administered 2017-12-20: 40 mL

## 2017-12-20 MED ORDER — ACETAMINOPHEN 10 MG/ML IV SOLN: 1000.0000 mg | Freq: Once | INTRAVENOUS | Status: DC | PRN

## 2017-12-20 SURGICAL SUPPLY — 40 items
ANCHOR JUGGERKNOT WTAP NDL 2.9 (Anchor) ×2 IMPLANT
ANCHOR SUT W/ ORTHOCORD (Anchor) ×4 IMPLANT
BIT DRILL JUGRKNT W/NDL BIT2.9 (DRILL) ×1 IMPLANT
BLADE FULL RADIUS 3.5 (BLADE) ×2 IMPLANT
BUR ACROMIONIZER 4.0 (BURR) ×2 IMPLANT
CANNULA SHAVER 8MMX76MM (CANNULA) ×4 IMPLANT
CHLORAPREP W/TINT 26ML (MISCELLANEOUS) ×2 IMPLANT
COVER LIGHT HANDLE UNIVERSAL (MISCELLANEOUS) ×4 IMPLANT
COVER MAYO STAND STRL (DRAPES) ×2 IMPLANT
DRAPE IMP U-DRAPE 54X76 (DRAPES) ×4 IMPLANT
DRILL JUGGERKNOT W/NDL BIT 2.9 (DRILL) ×2
GAUZE PETRO XEROFOAM 1X8 (MISCELLANEOUS) ×2 IMPLANT
GAUZE SPONGE 4X4 12PLY STRL (GAUZE/BANDAGES/DRESSINGS) ×2 IMPLANT
GLOVE BIO SURGEON STRL SZ8 (GLOVE) ×6 IMPLANT
GLOVE INDICATOR 8.0 STRL GRN (GLOVE) ×6 IMPLANT
GOWN STRL REUS W/ TWL LRG LVL3 (GOWN DISPOSABLE) ×2 IMPLANT
GOWN STRL REUS W/ TWL XL LVL3 (GOWN DISPOSABLE) ×1 IMPLANT
GOWN STRL REUS W/TWL LRG LVL3 (GOWN DISPOSABLE) ×2
GOWN STRL REUS W/TWL XL LVL3 (GOWN DISPOSABLE) ×1
GRASPER SUT 15 45D LOW PRO (SUTURE) ×2 IMPLANT
IV LACTATED RINGER IRRG 3000ML (IV SOLUTION) ×2
IV LR IRRIG 3000ML ARTHROMATIC (IV SOLUTION) ×2 IMPLANT
KIT ROOM TURNOVER OR (KITS) ×2 IMPLANT
MANIFOLD 4PT FOR NEPTUNE1 (MISCELLANEOUS) ×2 IMPLANT
MAT BLUE FLOOR 46X72 FLO (MISCELLANEOUS) ×2 IMPLANT
NDL MAYO CATGUT SZ5 (NEEDLE)
NDL SUT 5 .5 CRC TPR PNT MAYO (NEEDLE) IMPLANT
NEEDLE HYPO 21X1.5 SAFETY (NEEDLE) ×2 IMPLANT
PACK ARTHROSCOPY SHOULDER (MISCELLANEOUS) ×2 IMPLANT
PAD GROUND ADULT SPLIT (MISCELLANEOUS) ×2 IMPLANT
SLING ULTRA II M (MISCELLANEOUS) ×2 IMPLANT
STAPLER SKIN PROX 35W (STAPLE) ×2 IMPLANT
STRAP BODY AND KNEE 60X3 (MISCELLANEOUS) ×2 IMPLANT
SUT ETHIBOND 0 MO6 C/R (SUTURE) ×2 IMPLANT
SUT VIC AB 2-0 CT1 27 (SUTURE) ×2
SUT VIC AB 2-0 CT1 TAPERPNT 27 (SUTURE) ×2 IMPLANT
SYR 30ML LL (SYRINGE) IMPLANT
TAPE MICROFOAM 4IN (TAPE) ×2 IMPLANT
TUBING ARTHRO INFLOW-ONLY STRL (TUBING) ×2 IMPLANT
WAND HAND CNTRL MULTIVAC 90 (MISCELLANEOUS) ×2 IMPLANT

## 2017-12-20 NOTE — Anesthesia Postprocedure Evaluation (Signed)
Anesthesia Post Note  Patient: Rachel James  Procedure(s) Performed: SHOULDER ARTHROSCOPY WITH DEBRIDEMENT, DECOMPRESSION AND  BICEPS TENODESIS (Right Shoulder)  Patient location during evaluation: PACU Anesthesia Type: Regional Level of consciousness: awake and alert, oriented and patient cooperative Pain management: pain level controlled Vital Signs Assessment: post-procedure vital signs reviewed and stable Respiratory status: spontaneous breathing, nonlabored ventilation and respiratory function stable Cardiovascular status: blood pressure returned to baseline and stable Postop Assessment: adequate PO intake Anesthetic complications: no    Darrin Nipper

## 2017-12-20 NOTE — Op Note (Signed)
12/20/2017  4:10 PM  Patient:   Rachel James  Pre-Op Diagnosis:   Impingement/tendinopathy with biceps tendinopathy and advanced degenerative joint disease, right shoulder.  Post-Op Diagnosis: Impingement/tendinopathy with partial-thickness rotator cuff tear, biceps tendinopathy, labral tear, and advanced degenerative joint disease, right shoulder.  Procedure: Extensive arthroscopic debridement, arthroscopic SLAP repair, subacromial decompression, and mini-open biceps tenodesis, right shoulder.  Anesthesia: General LMA with interscalene block placed preoperatively by the anesthesiologist.  Surgeon:   Pascal Lux, MD  Assistant:   None  Findings: As above. There was extensive grade IV chondromalacia involving the entire glenoid surface and most of the humeral articular surface. There was a significant type II labral tear extending from the 1 o'clock position posteriorly to the 9 o'clock position which subluxed well into the joint. There was a longitudinal partial-thickness tear involving the insertional fibers of the subscapularis tendon, as well as significant tendinopathic changes with mild articular surface tearing of the supraspinatus tendon involving less than 10% of the footprint.  Complications: None  Fluids:   800 cc  Estimated blood loss: 10 cc  Tourniquet time: None  Drains: None  Closure: Staples   Brief clinical note: The patient is a 72 year old female with a long history of gradually worsening right shoulder pain. The patient's symptoms have progressed despite medications, activity modification, etc. The patient's history and examination are consistent with impingement/tendinopathy with advanced underlying degenerative joint disease. These findings were confirmed by MRI scan. The patient does not wish to consider a shoulder replacement at this time. Therefore, the patient presents at this time for an extensive arthroscopic debridement with  decompression and biceps tenodesis of the right shoulder.  Procedure: The patient underwent placement of an interscalene block by the anesthesiologist in the preoperative holding area before being brought into the operating room and lain in the supine position. The patient then underwent general laryngeal mask anesthesia before being repositioned in the beach chair position using the beach chair positioner. The right shoulder and upper extremity were prepped with ChloraPrep solution before being draped sterilely. Preoperative antibiotics were administered. A timeout was performed to confirm the proper surgical site before the expected portal sites and incision site were injected with 0.5% Sensorcaine with epinephrine. A posterior portal was created and the glenohumeral joint thoroughly inspected with the findings as described above. An anterior portal was created using an outside-in technique. The labrum and rotator cuff were further probed, again confirming the above-noted findings. The frayed margins of the labrum were debrided back to stable margins using the full-radius resector, as were the torn portions of the supraspinatus and subscapularis tendons. In addition, areas of extensive synovitis anteriorly, superiorly, and posteriorly were debrided using the full-radius resector. The ArthroCare wand was inserted and used to obtain hemostasis as well as to "anneal" the labrum superiorly and anteriorly.  Utilizing a separate superolateral portal site created using an outside-in technique, the superior labral tear was repaired using two Mitek BioKnotless anchors placed at the 11:00 and 12:00 positions respectively. Subsequent probing of the repair demonstrated excellent stability. The instruments were removed from the joint after suctioning the excess fluid.  The camera was repositioned through the posterior portal into the subacromial space. Utilizing the superolateral portal created to assist with the SLAP  repair, the 3.5 mm full-radius resector was introduced and used to perform a subtotal bursectomy. The ArthroCare wand was then inserted and used to remove the periosteal tissue off the undersurface of the anterior third of the acromion as well as to  recess the coracoacromial ligament from its attachment along the anterior and lateral margins of the acromion. The 4.0 mm acromionizing bur was introduced and used to complete the decompression by removing the undersurface of the anterior third of the acromion. The full radius resector was reintroduced to remove any residual bony debris before the ArthroCare wand was reintroduced to obtain hemostasis. The instruments were then removed from the subacromial space after suctioning the excess fluid.  An approximately 3-4 cm incision was made over the anterolateral aspect of the shoulder beginning at the anterolateral corner of the acromion and extending distally in line with the bicipital groove. This incision was carried down through the subcutaneous tissues to expose the deltoid fascia. The raphae between the anterior and middle thirds was identified and this plane developed to provide access into the subacromial space. Additional bursal tissues were debrided sharply using Metzenbaum scissors. The rotator cuff tear was inspected and found to be intact.  The bicipital groove was identified by palpation and opened for 1-1.5 cm. The biceps tendon stump was retrieved through this defect. The floor of the bicipital groove was roughened with a curet before a single Biomet 2.9 mm JuggerKnot anchor was inserted. Both sets of sutures were passed through the biceps tendon and tied securely to effect the tenodesis. The bicipital sheath was reapproximated using two #0 Ethibond interrupted sutures, incorporating the biceps tendon to further reinforce the tenodesis.  The wound was copiously irrigated with sterile saline solution before the deltoid raphae was reapproximated using  2-0 Vicryl interrupted sutures. The subcutaneous tissues were closed in two layers using 2-0 Vicryl interrupted sutures before the skin was closed using staples. The portal sites also were closed using staples. A sterile bulky dressing was applied to the shoulder before the arm was placed into a shoulder immobilizer. The patient was then awakened, extubated, and returned to the recovery room in satisfactory condition after tolerating the procedure well.

## 2017-12-20 NOTE — Anesthesia Procedure Notes (Signed)
Procedure Name: LMA Insertion Date/Time: 12/20/2017 2:21 PM Performed by: Cameron Ali, CRNA Pre-anesthesia Checklist: Patient identified, Emergency Drugs available, Suction available, Timeout performed and Patient being monitored Patient Re-evaluated:Patient Re-evaluated prior to induction Oxygen Delivery Method: Circle system utilized Preoxygenation: Pre-oxygenation with 100% oxygen Induction Type: IV induction LMA: LMA inserted LMA Size: 4.0 Number of attempts: 1 Placement Confirmation: positive ETCO2 and breath sounds checked- equal and bilateral Tube secured with: Tape

## 2017-12-20 NOTE — Anesthesia Preprocedure Evaluation (Signed)
Anesthesia Evaluation  Patient identified by MRN, date of birth, ID band Patient awake    Reviewed: Allergy & Precautions, NPO status , Patient's Chart, lab work & pertinent test results  History of Anesthesia Complications (+) PONV and history of anesthetic complications  Airway Mallampati: I  TM Distance: >3 FB Neck ROM: Full    Dental no notable dental hx.    Pulmonary neg pulmonary ROS,    Pulmonary exam normal breath sounds clear to auscultation       Cardiovascular Exercise Tolerance: Good hypertension, Normal cardiovascular exam Rhythm:Regular Rate:Normal     Neuro/Psych negative neurological ROS     GI/Hepatic GERD  ,  Endo/Other  negative endocrine ROS  Renal/GU Renal disease (stage III CKD)     Musculoskeletal  (+) Arthritis , Osteoarthritis,  Spinal stenosis   Abdominal   Peds  Hematology negative hematology ROS (+)   Anesthesia Other Findings   Reproductive/Obstetrics                             Anesthesia Physical Anesthesia Plan  ASA: II  Anesthesia Plan: General and Regional   Post-op Pain Management:  Regional for Post-op pain and GA combined w/ Regional for post-op pain   Induction: Intravenous  PONV Risk Score and Plan: 3 and Scopolamine patch - Pre-op, Dexamethasone and Ondansetron  Airway Management Planned: LMA  Additional Equipment:   Intra-op Plan:   Post-operative Plan: Extubation in OR  Informed Consent: I have reviewed the patients History and Physical, chart, labs and discussed the procedure including the risks, benefits and alternatives for the proposed anesthesia with the patient or authorized representative who has indicated his/her understanding and acceptance.     Plan Discussed with: CRNA  Anesthesia Plan Comments:         Anesthesia Quick Evaluation

## 2017-12-20 NOTE — Anesthesia Procedure Notes (Signed)
Anesthesia Regional Block: Interscalene brachial plexus block   Pre-Anesthetic Checklist: ,, timeout performed, Correct Patient, Correct Site, Correct Laterality, Correct Procedure, Correct Position, site marked, Risks and benefits discussed,  Surgical consent,  Pre-op evaluation,  At surgeon's request and post-op pain management  Laterality: Right  Prep: chloraprep       Needles:  Injection technique: Single-shot  Needle Type: Stimiplex     Needle Length: 5cm  Needle Gauge: 21     Additional Needles:   Procedures:,,,, ultrasound used (permanent image in chart),,,,  Narrative:  Start time: 12/20/2017 1:30 PM End time: 12/20/2017 1:35 PM Injection made incrementally with aspirations every 5 mL.  Performed by: Personally  Anesthesiologist: Darrin Nipper, MD  Additional Notes: Functioning IV was confirmed and monitors applied. Ultrasound guidance: relevant anatomy identified, needle position confirmed, local anesthetic spread visualized around nerve(s)., vascular puncture avoided.  Image printed for medical record.  Negative aspiration and no paresthesias; incremental administration of local anesthetic. The patient tolerated the procedure well. Vitals signes recorded in RN notes.

## 2017-12-20 NOTE — H&P (Signed)
Paper H&P to be scanned into permanent record. H&P reviewed and patient re-examined. No changes. 

## 2017-12-20 NOTE — Discharge Instructions (Signed)
General Anesthesia, Adult, Care After These instructions provide you with information about caring for yourself after your procedure. Your health care provider may also give you more specific instructions. Your treatment has been planned according to current medical practices, but problems sometimes occur. Call your health care provider if you have any problems or questions after your procedure. What can I expect after the procedure? After the procedure, it is common to have:  Vomiting.  A sore throat.  Mental slowness.  It is common to feel:  Nauseous.  Cold or shivery.  Sleepy.  Tired.  Sore or achy, even in parts of your body where you did not have surgery.  Follow these instructions at home: For at least 24 hours after the procedure:  Do not: ? Participate in activities where you could fall or become injured. ? Drive. ? Use heavy machinery. ? Drink alcohol. ? Take sleeping pills or medicines that cause drowsiness. ? Make important decisions or sign legal documents. ? Take care of children on your own.  Rest. Eating and drinking  If you vomit, drink water, juice, or soup when you can drink without vomiting.  Drink enough fluid to keep your urine clear or pale yellow.  Make sure you have little or no nausea before eating solid foods.  Follow the diet recommended by your health care provider. General instructions  Have a responsible adult stay with you until you are awake and alert.  Return to your normal activities as told by your health care provider. Ask your health care provider what activities are safe for you.  Take over-the-counter and prescription medicines only as told by your health care provider.  If you smoke, do not smoke without supervision.  Keep all follow-up visits as told by your health care provider. This is important. Contact a health care provider if:  You continue to have nausea or vomiting at home, and medicines are not helpful.  You  cannot drink fluids or start eating again.  You cannot urinate after 8-12 hours.  You develop a skin rash.  You have fever.  You have increasing redness at the site of your procedure. Get help right away if:  You have difficulty breathing.  You have chest pain.  You have unexpected bleeding.  You feel that you are having a life-threatening or urgent problem. This information is not intended to replace advice given to you by your health care provider. Make sure you discuss any questions you have with your health care provider. Document Released: 03/13/2001 Document Revised: 05/09/2016 Document Reviewed: 11/19/2015 Elsevier Interactive Patient Education  2018 [employer].   Keep dressing dry and intact.  May shower after dressing changed on post-op day #4 (Sunday).  Cover staples with Band-Aids after drying off. Apply ice frequently to shoulder. Take Aleve 1 tablet BID with meals for 7-10 days, then as necessary. Take ES Tylenol or hydrocodone as prescribed when needed.  Keep shoulder immobilizer on at all times except may remove for bathing purposes. Follow-up in 10-14 days or as scheduled.

## 2017-12-20 NOTE — Transfer of Care (Signed)
Immediate Anesthesia Transfer of Care Note  Patient: Rachel James  Procedure(s) Performed: SHOULDER ARTHROSCOPY WITH DEBRIDEMENT, DECOMPRESSION AND  BICEPS TENODESIS (Right Shoulder)  Patient Location: PACU  Anesthesia Type: General, Regional  Level of Consciousness: awake, alert  and patient cooperative  Airway and Oxygen Therapy: Patient Spontanous Breathing and Patient connected to supplemental oxygen  Post-op Assessment: Post-op Vital signs reviewed, Patient's Cardiovascular Status Stable, Respiratory Function Stable, Patent Airway and No signs of Nausea or vomiting  Post-op Vital Signs: Reviewed and stable  Complications: No apparent anesthesia complications

## 2017-12-20 NOTE — Progress Notes (Signed)
Assisted Darrin Nipper, ANMD with right, ultrasound guided, supraclavicular block. Side rails up, monitors on throughout procedure. See vital signs in flow sheet. Tolerated Procedure well.

## 2017-12-21 ENCOUNTER — Encounter: Payer: Self-pay | Admitting: Surgery

## 2017-12-26 DIAGNOSIS — M25611 Stiffness of right shoulder, not elsewhere classified: Secondary | ICD-10-CM | POA: Diagnosis not present

## 2017-12-26 DIAGNOSIS — M25511 Pain in right shoulder: Secondary | ICD-10-CM | POA: Diagnosis not present

## 2017-12-26 DIAGNOSIS — M6281 Muscle weakness (generalized): Secondary | ICD-10-CM | POA: Diagnosis not present

## 2017-12-26 DIAGNOSIS — Z9889 Other specified postprocedural states: Secondary | ICD-10-CM | POA: Diagnosis not present

## 2018-01-04 DIAGNOSIS — M25611 Stiffness of right shoulder, not elsewhere classified: Secondary | ICD-10-CM | POA: Diagnosis not present

## 2018-01-04 DIAGNOSIS — M6281 Muscle weakness (generalized): Secondary | ICD-10-CM | POA: Diagnosis not present

## 2018-01-04 DIAGNOSIS — M25511 Pain in right shoulder: Secondary | ICD-10-CM | POA: Diagnosis not present

## 2018-01-04 DIAGNOSIS — Z9889 Other specified postprocedural states: Secondary | ICD-10-CM | POA: Diagnosis not present

## 2018-01-15 ENCOUNTER — Other Ambulatory Visit: Payer: Self-pay | Admitting: Family Medicine

## 2018-01-15 DIAGNOSIS — E785 Hyperlipidemia, unspecified: Secondary | ICD-10-CM

## 2018-01-15 NOTE — Telephone Encounter (Signed)
Copied from [place]. Topic: Quick Communication - Rx Refill/Question >> Jan 15, 2018 12:18 PM Rachel James wrote: Medication: simvastatin (ZOCOR) 10 MG tablet   Has the patient contacted their pharmacy? No-new pharmacy   (Agent: If no, request that the patient contact the pharmacy for the refill.)   Preferred Pharmacy (with phone number or street name):  [place], [place] - [address] [phone] (Phone) [phone] (Fax)     Agent: Please be advised that RX refills may take up to 3 business days. We ask that you follow-up with your pharmacy.

## 2018-01-15 NOTE — Telephone Encounter (Signed)
90 day new mail order pharmacy

## 2018-01-16 MED ORDER — SIMVASTATIN 10 MG PO TABS: 10.0000 mg | ORAL_TABLET | Freq: Every day | ORAL | 2 refills | Status: DC

## 2018-01-18 DIAGNOSIS — Z9889 Other specified postprocedural states: Secondary | ICD-10-CM | POA: Diagnosis not present

## 2018-01-23 DIAGNOSIS — Z9889 Other specified postprocedural states: Secondary | ICD-10-CM | POA: Diagnosis not present

## 2018-01-26 DIAGNOSIS — Z9889 Other specified postprocedural states: Secondary | ICD-10-CM | POA: Diagnosis not present

## 2018-01-29 MED ORDER — SIMVASTATIN 10 MG PO TABS: 10.0000 mg | ORAL_TABLET | Freq: Every day | ORAL | 1 refills | Status: DC

## 2018-01-29 NOTE — Addendum Note (Signed)
Addended by: Sari Cogan, Satira Anis on: 01/29/2018 04:02 PM   Modules accepted: Orders

## 2018-01-29 NOTE — Telephone Encounter (Signed)
I sent new Rx and copied and pasted her address in [place], [place] to Rx

## 2018-01-29 NOTE — Addendum Note (Signed)
Addended by: Docia Furl on: 01/29/2018 03:37 PM   Modules accepted: Orders

## 2018-01-29 NOTE — Telephone Encounter (Signed)
Can we resend?

## 2018-01-29 NOTE — Telephone Encounter (Signed)
Pt states [facility] does not have a record of getting this Rx.  Would like to resend this rx.  Pt also wants it to go to her vacation home. Pt states [facility] said that needs to come from our office [place], [place]  simvastatin (ZOCOR) 10 MG tablet  90 day  [place], [place] - [address] [phone] (Phone) [phone] (Fax)

## 2018-01-30 DIAGNOSIS — Z9889 Other specified postprocedural states: Secondary | ICD-10-CM | POA: Diagnosis not present

## 2018-01-31 ENCOUNTER — Other Ambulatory Visit: Payer: Self-pay | Admitting: Family Medicine

## 2018-01-31 DIAGNOSIS — E785 Hyperlipidemia, unspecified: Secondary | ICD-10-CM

## 2018-01-31 NOTE — Telephone Encounter (Signed)
Copied from [place]. Topic: Quick Communication - See Telephone Encounter >> Jan 31, 2018  1:30 PM Hewitt Shorts wrote: CRM for notification. See Telephone encounter for: pt called to check on the status of the simvastatin -[facility] states that they do not have the rx and the when the office resends the rx it needs to use the electronic number  [id number]  Best number is [phone]  01/31/18.

## 2018-02-01 MED ORDER — SIMVASTATIN 10 MG PO TABS: 10.0000 mg | ORAL_TABLET | Freq: Every day | ORAL | 1 refills | Status: DC

## 2018-02-01 NOTE — Telephone Encounter (Signed)
Sure Thank you for trying

## 2018-02-01 NOTE — Telephone Encounter (Signed)
So I have tried to call the pharmacy 3x times and have tried different extensions and still get hung up on?  Can you try to resend rx?

## 2018-02-01 NOTE — Telephone Encounter (Signed)
I sent the prescription on Feb 11th Please call and resolve Thank you See previous note because she wanted it mailed to her home in [place], apparently

## 2018-02-02 DIAGNOSIS — Z9889 Other specified postprocedural states: Secondary | ICD-10-CM | POA: Diagnosis not present

## 2018-02-04 NOTE — Progress Notes (Signed)
Closing out lab/order note open since:  June 2018 

## 2018-02-06 DIAGNOSIS — Z9889 Other specified postprocedural states: Secondary | ICD-10-CM | POA: Diagnosis not present

## 2018-02-07 ENCOUNTER — Other Ambulatory Visit: Payer: Self-pay

## 2018-02-07 DIAGNOSIS — E785 Hyperlipidemia, unspecified: Secondary | ICD-10-CM

## 2018-02-07 MED ORDER — SIMVASTATIN 10 MG PO TABS: 10.0000 mg | ORAL_TABLET | Freq: Every day | ORAL | 1 refills | Status: DC

## 2018-02-07 NOTE — Telephone Encounter (Signed)
Copied from [place]. Topic: Quick Communication - See Telephone Encounter >> Jan 31, 2018  1:30 PM Hewitt Shorts wrote: CRM for notification. See Telephone encounter for: pt called to check on the status of the simvastatin -[facility] states that they do not have the rx and the when the office resends the rx it needs to use the electronic number  [id number]  Best number is [phone]  01/31/18. >> Feb 06, 2018 11:22 AM Hewitt Shorts wrote: Pt is calling back again today to see about her simvastatin and why [facility] still has not gotten this request -pt did remind the office that the pharmacy stated to [place] [phone] to send this electronicly -pt is needing to get this as soon as possible   Medical sales representative in [place], [place]. Spoke with the pharmacist who states that they do not fill out of state prescriptions (no exceptions) he states that this RX would need to go to [facility] division. Called pharmacy and confirmed that she does want the rx sent to the mail order divsion. I have updated the pharmacy to the correct pharmacy can we please send in again. I greatly apologized to the patient for the delay.

## 2018-02-07 NOTE — Telephone Encounter (Signed)
RX sent to mail order pharmacy. They prefer rx sent electronically vs verbal. Sent accordingly. Advised pt to call us back pharmacy has not contacted her with rx by the end of the week (pt notes she typically gets a call when they receive the rx. )

## 2018-02-07 NOTE — Telephone Encounter (Signed)
Please call this in wherever it needs to go with whatever numbers need to be attached I have approved it twice; use my last Rx as the call in please

## 2018-02-08 DIAGNOSIS — Z9889 Other specified postprocedural states: Secondary | ICD-10-CM | POA: Diagnosis not present

## 2018-02-12 ENCOUNTER — Other Ambulatory Visit: Payer: Self-pay | Admitting: Family Medicine

## 2018-02-12 DIAGNOSIS — Z9889 Other specified postprocedural states: Secondary | ICD-10-CM | POA: Diagnosis not present

## 2018-02-12 NOTE — Telephone Encounter (Signed)
90 day

## 2018-02-12 NOTE — Telephone Encounter (Signed)
Copied from [place] [id number]. Topic: Quick Communication - Rx Refill/Question >> Feb 12, 2018 10:04 AM Scherrie Gerlach wrote: Medication: chlorthalidone (HYGROTON) 25 MG tablet  Pt just got set up with new mail order and needs new Rx sent to   [place], [place] [phone] (Phone) [phone] (Fax)

## 2018-02-13 MED ORDER — CHLORTHALIDONE 25 MG PO TABS: 12.5000 mg | ORAL_TABLET | Freq: Every day | ORAL | 1 refills | Status: DC

## 2018-02-15 DIAGNOSIS — Z9889 Other specified postprocedural states: Secondary | ICD-10-CM | POA: Diagnosis not present

## 2018-02-20 DIAGNOSIS — Z9889 Other specified postprocedural states: Secondary | ICD-10-CM | POA: Diagnosis not present

## 2018-02-22 DIAGNOSIS — Z9889 Other specified postprocedural states: Secondary | ICD-10-CM | POA: Diagnosis not present

## 2018-02-28 DIAGNOSIS — Z9889 Other specified postprocedural states: Secondary | ICD-10-CM | POA: Diagnosis not present

## 2018-03-02 DIAGNOSIS — Z9889 Other specified postprocedural states: Secondary | ICD-10-CM | POA: Diagnosis not present

## 2018-03-06 DIAGNOSIS — Z9889 Other specified postprocedural states: Secondary | ICD-10-CM | POA: Diagnosis not present

## 2018-03-14 DIAGNOSIS — M25511 Pain in right shoulder: Secondary | ICD-10-CM | POA: Diagnosis not present

## 2018-03-14 DIAGNOSIS — M25611 Stiffness of right shoulder, not elsewhere classified: Secondary | ICD-10-CM | POA: Diagnosis not present

## 2018-03-14 DIAGNOSIS — M6281 Muscle weakness (generalized): Secondary | ICD-10-CM | POA: Diagnosis not present

## 2018-03-14 DIAGNOSIS — Z9889 Other specified postprocedural states: Secondary | ICD-10-CM | POA: Diagnosis not present

## 2018-03-14 DIAGNOSIS — M19011 Primary osteoarthritis, right shoulder: Secondary | ICD-10-CM | POA: Diagnosis not present

## 2018-03-16 DIAGNOSIS — M48062 Spinal stenosis, lumbar region with neurogenic claudication: Secondary | ICD-10-CM | POA: Diagnosis not present

## 2018-03-16 DIAGNOSIS — M1612 Unilateral primary osteoarthritis, left hip: Secondary | ICD-10-CM | POA: Diagnosis not present

## 2018-03-16 DIAGNOSIS — M47816 Spondylosis without myelopathy or radiculopathy, lumbar region: Secondary | ICD-10-CM | POA: Diagnosis not present

## 2018-03-16 DIAGNOSIS — M5136 Other intervertebral disc degeneration, lumbar region: Secondary | ICD-10-CM | POA: Diagnosis not present

## 2018-03-16 DIAGNOSIS — M5416 Radiculopathy, lumbar region: Secondary | ICD-10-CM | POA: Diagnosis not present

## 2018-03-19 DIAGNOSIS — M1612 Unilateral primary osteoarthritis, left hip: Secondary | ICD-10-CM | POA: Diagnosis not present

## 2018-03-20 ENCOUNTER — Other Ambulatory Visit: Payer: Self-pay

## 2018-03-20 ENCOUNTER — Telehealth: Payer: Self-pay | Admitting: Family Medicine

## 2018-03-20 DIAGNOSIS — X32XXXA Exposure to sunlight, initial encounter: Secondary | ICD-10-CM | POA: Diagnosis not present

## 2018-03-20 DIAGNOSIS — L570 Actinic keratosis: Secondary | ICD-10-CM | POA: Diagnosis not present

## 2018-03-20 DIAGNOSIS — I10 Essential (primary) hypertension: Secondary | ICD-10-CM

## 2018-03-20 MED ORDER — METOPROLOL SUCCINATE ER 25 MG PO TB24: 25.0000 mg | ORAL_TABLET | Freq: Every day | ORAL | 2 refills | Status: DC

## 2018-03-20 NOTE — Telephone Encounter (Signed)
Pt is needing new mail order pharmacy.

## 2018-03-20 NOTE — Telephone Encounter (Signed)
Copied from [place] [id number]. Topic: Quick Communication - Rx Refill/Question >> Mar 20, 2018  2:35 PM Margot Ables wrote: Medication: metoprolol - new RX needing sent to mail order pharmacy - 90 day supply required - pt has 7-8 days on hand - takes 1/day Has the patient contacted their pharmacy? yes Preferred Pharmacy (with phone number or street name): [facility] [place] [address] Phone: [phone] Fax: [phone]

## 2018-03-22 DIAGNOSIS — M6281 Muscle weakness (generalized): Secondary | ICD-10-CM | POA: Diagnosis not present

## 2018-03-22 DIAGNOSIS — M25511 Pain in right shoulder: Secondary | ICD-10-CM | POA: Diagnosis not present

## 2018-03-22 DIAGNOSIS — M25611 Stiffness of right shoulder, not elsewhere classified: Secondary | ICD-10-CM | POA: Diagnosis not present

## 2018-03-22 DIAGNOSIS — Z9889 Other specified postprocedural states: Secondary | ICD-10-CM | POA: Diagnosis not present

## 2018-03-26 DIAGNOSIS — M25511 Pain in right shoulder: Secondary | ICD-10-CM | POA: Diagnosis not present

## 2018-03-26 DIAGNOSIS — M6281 Muscle weakness (generalized): Secondary | ICD-10-CM | POA: Diagnosis not present

## 2018-03-26 DIAGNOSIS — M25611 Stiffness of right shoulder, not elsewhere classified: Secondary | ICD-10-CM | POA: Diagnosis not present

## 2018-03-26 DIAGNOSIS — Z9889 Other specified postprocedural states: Secondary | ICD-10-CM | POA: Diagnosis not present

## 2018-03-28 ENCOUNTER — Other Ambulatory Visit: Payer: Self-pay

## 2018-03-28 ENCOUNTER — Encounter: Payer: Self-pay | Admitting: Family Medicine

## 2018-03-28 ENCOUNTER — Ambulatory Visit (INDEPENDENT_AMBULATORY_CARE_PROVIDER_SITE_OTHER): Payer: PPO | Admitting: Family Medicine

## 2018-03-28 DIAGNOSIS — E785 Hyperlipidemia, unspecified: Secondary | ICD-10-CM | POA: Diagnosis not present

## 2018-03-28 DIAGNOSIS — K219 Gastro-esophageal reflux disease without esophagitis: Secondary | ICD-10-CM

## 2018-03-28 DIAGNOSIS — M8580 Other specified disorders of bone density and structure, unspecified site: Secondary | ICD-10-CM | POA: Diagnosis not present

## 2018-03-28 DIAGNOSIS — N183 Chronic kidney disease, stage 3 unspecified: Secondary | ICD-10-CM

## 2018-03-28 DIAGNOSIS — I10 Essential (primary) hypertension: Secondary | ICD-10-CM | POA: Diagnosis not present

## 2018-03-28 LAB — COMPLETE METABOLIC PANEL WITH GFR
AG Ratio: 2.2 (calc) (ref 1.0–2.5)
ALBUMIN MSPROF: 4.3 g/dL (ref 3.6–5.1)
ALKALINE PHOSPHATASE (APISO): 42 U/L (ref 33–130)
ALT: 11 U/L (ref 6–29)
AST: 17 U/L (ref 10–35)
BUN: 13 mg/dL (ref 7–25)
CO2: 32 mmol/L (ref 20–32)
CREATININE: 0.8 mg/dL (ref 0.60–0.93)
Calcium: 9.6 mg/dL (ref 8.6–10.4)
Chloride: 97 mmol/L — ABNORMAL LOW (ref 98–110)
GFR, Est African American: 87 mL/min/{1.73_m2} (ref >=60)
GFR, Est Non African American: 75 mL/min/{1.73_m2} (ref >=60)
Globulin: 2 g/dL (calc) (ref 1.9–3.7)
Glucose, Bld: 86 mg/dL (ref 65–99)
Potassium: 4 mmol/L (ref 3.5–5.3)
Sodium: 134 mmol/L — ABNORMAL LOW (ref 135–146)
TOTAL PROTEIN: 6.3 g/dL (ref 6.1–8.1)
Total Bilirubin: 0.7 mg/dL (ref 0.2–1.2)

## 2018-03-28 LAB — LIPID PANEL
CHOL/HDL RATIO: 2.2 (calc) (ref <5.0)
CHOLESTEROL: 183 mg/dL (ref <200)
HDL: 83 mg/dL (ref >50)
LDL CHOLESTEROL (CALC): 84 mg/dL
Non-HDL Cholesterol (Calc): 100 mg/dL (calc) (ref <130)
TRIGLYCERIDES: 72 mg/dL (ref <150)

## 2018-03-28 NOTE — Assessment & Plan Note (Signed)
Continue for now; when running low on thiazide, consider combining HCTZ with beta-blocker or separate b/c breaking 25 mg pills is hard

## 2018-03-28 NOTE — Patient Instructions (Addendum)
Let me know when you have about two weeks of chlorthalidone left and I can send in a new prescription for something else Check at home and let us know what your vitamin D strength is Try to follow the DASH guidelines (DASH stands for Dietary Approaches to Stop Hypertension). Try to limit the sodium in your diet to no more than 1,500mg  of sodium per day. Certainly try to not exceed 2,000 mg per day at the very most. Do not add salt when cooking or at the table.  Check the sodium amount on labels when shopping, and choose items lower in sodium when given a choice. Avoid or limit foods that already contain a lot of sodium. Eat a diet rich in fruits and vegetables and whole grains, and try to lose weight if overweight or obese We'll get labs today When you are ready to taper off of your gabapentin, take one fewer pill every 3-5 days and then stop

## 2018-03-28 NOTE — Progress Notes (Signed)
BP 116/74   Pulse 78   Temp 98.2 F (36.8 C) (Oral)   Resp 14   Ht 5\' 3"  (1.6 m)   Wt 136 lb 12.8 oz (62.1 kg)   SpO2 95%   BMI 24.23 kg/m    Subjective:    Patient ID: Rachel James, female    DOB: January 07, 1946, 72 y.o.   MRN: [id number]  HPI: Rachel James is a 72 y.o. female  Chief Complaint  Patient presents with  . Follow-up    6 months    HPI Patient is here for follow-up; she had shoulder surgery in January; not healing well; feeling worse than when she went in; she was loaded with arthritis; seeing PT twice a week; he doesn't think she's ever going to get back to baseline; had a cortisone shot two weeks ago; that did help some; just continously aches, sometimes more than others; she is thinking about having a total RIGHT shoulder replacement; learning to use left hand/arm; taking naproxen for pain and tylenol twice a day; tried a third dose but it caused upset stomach; uses the hydrocodone only if really needed, left over from surgery  She has hypertension; taking chlorthalidone; creatinine had bumped up a little bit in April, and she had her creatinine rechecked in August (1.02) and it returned back down (0.81); she is taking lopressor HCT  Taking simvastatin 10 mg for cholesterol; does not run in the family  Osteopenia; DEXA done November 2018; eating a lot of yogurt and ice cream; drinks almond milk; loves broccoli  Allergic rhinitis; stopped taking the loratidine, ran out of it, didn't reorder it, still getting "fur balls", wasn't helping  Taking gabapentin but would like to wean herself off; she had a very bad sciatica outbreak  Seeing ortho and just got steroid injection in the hip and shoulder  Depression screen [facility] 2/9 03/28/2018 09/26/2017 07/11/2017 03/27/2017 08/16/2016  Decreased Interest 0 0 0 0 0  Down, Depressed, Hopeless 0 0 0 0 0  PHQ - 2 Score 0 0 0 0 0    Relevant past medical, surgical, family and social history reviewed Past Medical History:    Diagnosis Date  . Allergy   . Arthritis    knees, lower back  . CKD (chronic kidney disease) stage 2, GFR 60-89 ml/min 07/11/2017  . Degenerative disc disease, lumbar   . Hypertension   . Osteopenia 06/22/2017   June 2016 DEXA  . PONV (postoperative nausea and vomiting)    in past.  none recently   Past Surgical History:  Procedure Laterality Date  .  THUMB SURGERY Left   . BREAST BIOPSY Right 1992   neg  . BREAST LUMPECTOMY Right   . BUNIONECTOMY Bilateral   . CERVICAL DISCECTOMY  09/18/2012   C4-C5 AND C6-C7 ACDF   . SHOULDER ARTHROSCOPY WITH ROTATOR CUFF REPAIR AND OPEN BICEPS TENODESIS Right 12/20/2017   Procedure: SHOULDER ARTHROSCOPY WITH DEBRIDEMENT, DECOMPRESSION AND  BICEPS TENODESIS;  Surgeon: Corky Mull, MD;  Location: [place];  Service: Orthopedics;  Laterality: Right;  . TUBAL LIGATION Bilateral    Family History  Problem Relation Age of Onset  . Heart disease Mother   . Heart attack Mother   . Heart disease Father   . Heart disease Sister   . Heart disease Brother   . Heart failure Brother   . Memory loss Brother   . Diabetes Paternal Grandmother   . Pulmonary embolism Sister   . Breast  cancer Neg Hx    Social History   Tobacco Use  . Smoking status: Never Smoker  . Smokeless tobacco: Never Used  Substance Use Topics  . Alcohol use: Yes    Alcohol/week: 0.0 oz    Comment: maybe 3 drinks per month  . Drug use: No    Interim medical history since last visit reviewed. Allergies and medications reviewed  Review of Systems Per HPI unless specifically indicated above     Objective:    BP 116/74   Pulse 78   Temp 98.2 F (36.8 C) (Oral)   Resp 14   Ht 5\' 3"  (1.6 m)   Wt 136 lb 12.8 oz (62.1 kg)   SpO2 95%   BMI 24.23 kg/m   Wt Readings from Last 3 Encounters:  03/28/18 136 lb 12.8 oz (62.1 kg)  12/20/17 143 lb (64.9 kg)  09/26/17 141 lb 3.2 oz (64 kg)    Physical Exam  Constitutional: She appears well-developed and  well-nourished. No distress.  HENT:  Head: Normocephalic and atraumatic.  Eyes: EOM are normal. No scleral icterus.  Neck: No thyromegaly present.  Cardiovascular: Normal rate, regular rhythm and normal heart sounds.  No murmur heard. Pulmonary/Chest: Effort normal and breath sounds normal. No respiratory distress. She has no wheezes.  Abdominal: Soft. Bowel sounds are normal. She exhibits no distension.  Musculoskeletal: Normal range of motion. She exhibits no edema.  Neurological: She is alert. She exhibits normal muscle tone.  Skin: Skin is warm and dry. She is not diaphoretic. No pallor.  Psychiatric: She has a normal mood and affect. Her behavior is normal. Judgment and thought content normal.      Assessment & Plan:   Problem List Items Addressed This Visit      Cardiovascular and Mediastinum   Hypertension goal BP (blood pressure) < 140/90 (Chronic)    Continue for now; when running low on thiazide, consider combining HCTZ with beta-blocker or separate b/c breaking 25 mg pills is hard      Relevant Orders   COMPLETE METABOLIC PANEL WITH GFR (Completed)     Digestive   GERD (gastroesophageal reflux disease)    Controlled without medicine; no blood in stool        Musculoskeletal and Integument   Osteopenia    Next DEXA due in Nov 2020; exercise, calcium, patient will check vit D strength and let us know, b/c too much is dangerous        Genitourinary   CKD (chronic kidney disease) stage 2, GFR 60-89 ml/min    Monitor because of NSAID use; stay hydrated        Other   Hyperlipidemia LDL goal <100 (Chronic)    Check lipids; continue low dose statin      Relevant Orders   Lipid panel (Completed)       Follow up plan: Return in about 6 months (around 09/27/2018) for follow-up visit with Dr. Sanda Klein; Medicare Wellness visit with Ammie.  An after-visit summary was printed and given to the patient at [facility].  Please see the patient instructions which may  contain other information and recommendations beyond what is mentioned above in the assessment and plan.  No orders of the defined types were placed in this encounter.   Orders Placed This Encounter  Procedures  . COMPLETE METABOLIC PANEL WITH GFR  . Lipid panel

## 2018-03-28 NOTE — Assessment & Plan Note (Signed)
Next DEXA due in Nov 2020; exercise, calcium, patient will check vit D strength and let us know, b/c too much is dangerous

## 2018-03-28 NOTE — Assessment & Plan Note (Signed)
Check lipids; continue low dose statin

## 2018-03-28 NOTE — Assessment & Plan Note (Signed)
Monitor because of NSAID use; stay hydrated

## 2018-03-28 NOTE — Assessment & Plan Note (Signed)
Controlled without medicine; no blood in stool

## 2018-03-29 ENCOUNTER — Ambulatory Visit: Payer: Medicare HMO | Admitting: Family Medicine

## 2018-03-29 ENCOUNTER — Encounter: Payer: Self-pay | Admitting: Family Medicine

## 2018-03-29 DIAGNOSIS — M25511 Pain in right shoulder: Secondary | ICD-10-CM | POA: Diagnosis not present

## 2018-03-29 DIAGNOSIS — M25611 Stiffness of right shoulder, not elsewhere classified: Secondary | ICD-10-CM | POA: Diagnosis not present

## 2018-03-29 DIAGNOSIS — M6281 Muscle weakness (generalized): Secondary | ICD-10-CM | POA: Diagnosis not present

## 2018-03-29 DIAGNOSIS — Z9889 Other specified postprocedural states: Secondary | ICD-10-CM | POA: Diagnosis not present

## 2018-03-31 ENCOUNTER — Other Ambulatory Visit: Payer: Self-pay | Admitting: Family Medicine

## 2018-03-31 ENCOUNTER — Encounter: Payer: Self-pay | Admitting: Family Medicine

## 2018-03-31 DIAGNOSIS — E871 Hypo-osmolality and hyponatremia: Secondary | ICD-10-CM | POA: Insufficient documentation

## 2018-03-31 NOTE — Progress Notes (Signed)
Recheck labs in 6 weeks, low Na+ and low Cl-

## 2018-04-02 ENCOUNTER — Encounter: Payer: Self-pay | Admitting: Family Medicine

## 2018-04-02 DIAGNOSIS — M25511 Pain in right shoulder: Secondary | ICD-10-CM | POA: Diagnosis not present

## 2018-04-02 DIAGNOSIS — M25611 Stiffness of right shoulder, not elsewhere classified: Secondary | ICD-10-CM | POA: Diagnosis not present

## 2018-04-02 DIAGNOSIS — M6281 Muscle weakness (generalized): Secondary | ICD-10-CM | POA: Diagnosis not present

## 2018-04-02 DIAGNOSIS — Z9889 Other specified postprocedural states: Secondary | ICD-10-CM | POA: Diagnosis not present

## 2018-04-05 DIAGNOSIS — M25611 Stiffness of right shoulder, not elsewhere classified: Secondary | ICD-10-CM | POA: Diagnosis not present

## 2018-04-05 DIAGNOSIS — Z9889 Other specified postprocedural states: Secondary | ICD-10-CM | POA: Diagnosis not present

## 2018-04-05 DIAGNOSIS — M25511 Pain in right shoulder: Secondary | ICD-10-CM | POA: Diagnosis not present

## 2018-04-05 DIAGNOSIS — M6281 Muscle weakness (generalized): Secondary | ICD-10-CM | POA: Diagnosis not present

## 2018-04-09 DIAGNOSIS — M25511 Pain in right shoulder: Secondary | ICD-10-CM | POA: Diagnosis not present

## 2018-04-09 DIAGNOSIS — Z9889 Other specified postprocedural states: Secondary | ICD-10-CM | POA: Diagnosis not present

## 2018-04-09 DIAGNOSIS — M6281 Muscle weakness (generalized): Secondary | ICD-10-CM | POA: Diagnosis not present

## 2018-04-09 DIAGNOSIS — M25611 Stiffness of right shoulder, not elsewhere classified: Secondary | ICD-10-CM | POA: Diagnosis not present

## 2018-04-12 DIAGNOSIS — M25611 Stiffness of right shoulder, not elsewhere classified: Secondary | ICD-10-CM | POA: Diagnosis not present

## 2018-04-12 DIAGNOSIS — M25511 Pain in right shoulder: Secondary | ICD-10-CM | POA: Diagnosis not present

## 2018-04-12 DIAGNOSIS — Z9889 Other specified postprocedural states: Secondary | ICD-10-CM | POA: Diagnosis not present

## 2018-04-12 DIAGNOSIS — M6281 Muscle weakness (generalized): Secondary | ICD-10-CM | POA: Diagnosis not present

## 2018-04-16 DIAGNOSIS — M25511 Pain in right shoulder: Secondary | ICD-10-CM | POA: Diagnosis not present

## 2018-04-16 DIAGNOSIS — M6281 Muscle weakness (generalized): Secondary | ICD-10-CM | POA: Diagnosis not present

## 2018-04-16 DIAGNOSIS — M25611 Stiffness of right shoulder, not elsewhere classified: Secondary | ICD-10-CM | POA: Diagnosis not present

## 2018-04-16 DIAGNOSIS — Z9889 Other specified postprocedural states: Secondary | ICD-10-CM | POA: Diagnosis not present

## 2018-04-18 DIAGNOSIS — M6281 Muscle weakness (generalized): Secondary | ICD-10-CM | POA: Diagnosis not present

## 2018-04-18 DIAGNOSIS — M25511 Pain in right shoulder: Secondary | ICD-10-CM | POA: Diagnosis not present

## 2018-04-18 DIAGNOSIS — M25611 Stiffness of right shoulder, not elsewhere classified: Secondary | ICD-10-CM | POA: Diagnosis not present

## 2018-04-18 DIAGNOSIS — Z9889 Other specified postprocedural states: Secondary | ICD-10-CM | POA: Diagnosis not present

## 2018-04-20 DIAGNOSIS — M5136 Other intervertebral disc degeneration, lumbar region: Secondary | ICD-10-CM | POA: Diagnosis not present

## 2018-04-20 DIAGNOSIS — M48062 Spinal stenosis, lumbar region with neurogenic claudication: Secondary | ICD-10-CM | POA: Diagnosis not present

## 2018-04-20 DIAGNOSIS — M5416 Radiculopathy, lumbar region: Secondary | ICD-10-CM | POA: Diagnosis not present

## 2018-04-23 DIAGNOSIS — M6281 Muscle weakness (generalized): Secondary | ICD-10-CM | POA: Diagnosis not present

## 2018-04-23 DIAGNOSIS — Z9889 Other specified postprocedural states: Secondary | ICD-10-CM | POA: Diagnosis not present

## 2018-04-23 DIAGNOSIS — M25511 Pain in right shoulder: Secondary | ICD-10-CM | POA: Diagnosis not present

## 2018-04-23 DIAGNOSIS — M25611 Stiffness of right shoulder, not elsewhere classified: Secondary | ICD-10-CM | POA: Diagnosis not present

## 2018-04-26 DIAGNOSIS — M25511 Pain in right shoulder: Secondary | ICD-10-CM | POA: Diagnosis not present

## 2018-04-26 DIAGNOSIS — M25611 Stiffness of right shoulder, not elsewhere classified: Secondary | ICD-10-CM | POA: Diagnosis not present

## 2018-04-26 DIAGNOSIS — Z9889 Other specified postprocedural states: Secondary | ICD-10-CM | POA: Diagnosis not present

## 2018-04-26 DIAGNOSIS — M6281 Muscle weakness (generalized): Secondary | ICD-10-CM | POA: Diagnosis not present

## 2018-05-01 DIAGNOSIS — L814 Other melanin hyperpigmentation: Secondary | ICD-10-CM | POA: Diagnosis not present

## 2018-05-01 DIAGNOSIS — X32XXXA Exposure to sunlight, initial encounter: Secondary | ICD-10-CM | POA: Diagnosis not present

## 2018-05-01 DIAGNOSIS — L720 Epidermal cyst: Secondary | ICD-10-CM | POA: Diagnosis not present

## 2018-05-01 DIAGNOSIS — L728 Other follicular cysts of the skin and subcutaneous tissue: Secondary | ICD-10-CM | POA: Diagnosis not present

## 2018-05-10 ENCOUNTER — Encounter: Payer: Self-pay | Admitting: Family Medicine

## 2018-05-11 ENCOUNTER — Encounter: Payer: Self-pay | Admitting: Family Medicine

## 2018-05-11 DIAGNOSIS — R252 Cramp and spasm: Secondary | ICD-10-CM

## 2018-05-11 DIAGNOSIS — Z5181 Encounter for therapeutic drug level monitoring: Secondary | ICD-10-CM

## 2018-05-22 ENCOUNTER — Other Ambulatory Visit: Payer: Self-pay

## 2018-05-22 ENCOUNTER — Encounter: Payer: Self-pay | Admitting: Family Medicine

## 2018-05-22 DIAGNOSIS — R252 Cramp and spasm: Secondary | ICD-10-CM

## 2018-05-22 DIAGNOSIS — Z5181 Encounter for therapeutic drug level monitoring: Secondary | ICD-10-CM | POA: Diagnosis not present

## 2018-05-22 LAB — BASIC METABOLIC PANEL
BUN: 11 mg/dL (ref 7–25)
CO2: 30 mmol/L (ref 20–32)
Calcium: 9.5 mg/dL (ref 8.6–10.4)
Chloride: 100 mmol/L (ref 98–110)
Creat: 0.8 mg/dL (ref 0.60–0.93)
Glucose, Bld: 79 mg/dL (ref 65–99)
Potassium: 4.2 mmol/L (ref 3.5–5.3)
Sodium: 135 mmol/L (ref 135–146)

## 2018-05-22 LAB — MAGNESIUM: Magnesium: 1.9 mg/dL (ref 1.5–2.5)

## 2018-05-23 DIAGNOSIS — M25511 Pain in right shoulder: Secondary | ICD-10-CM | POA: Insufficient documentation

## 2018-05-24 ENCOUNTER — Encounter: Payer: Self-pay | Admitting: Family Medicine

## 2018-06-04 MED ORDER — METOPROLOL SUCCINATE ER 50 MG PO TB24: 50.0000 mg | ORAL_TABLET | Freq: Every day | ORAL | 3 refills | Status: DC

## 2018-06-04 MED ORDER — AMLODIPINE BESYLATE 5 MG PO TABS: 5.0000 mg | ORAL_TABLET | Freq: Every day | ORAL | 3 refills | Status: DC

## 2018-06-04 NOTE — Addendum Note (Signed)
Addended by: Drevin Ortner, Satira Anis on: 06/04/2018 05:16 PM   Modules accepted: Orders

## 2018-06-11 ENCOUNTER — Other Ambulatory Visit: Payer: Self-pay | Admitting: Family Medicine

## 2018-06-11 DIAGNOSIS — Z1231 Encounter for screening mammogram for malignant neoplasm of breast: Secondary | ICD-10-CM

## 2018-06-19 ENCOUNTER — Ambulatory Visit (INDEPENDENT_AMBULATORY_CARE_PROVIDER_SITE_OTHER): Payer: PPO | Admitting: Family Medicine

## 2018-06-19 ENCOUNTER — Encounter: Payer: Self-pay | Admitting: Family Medicine

## 2018-06-19 VITALS — BP 118/82 | HR 68 | Temp 98.1°F | Resp 16 | Ht 63.0 in | Wt 135.1 lb

## 2018-06-19 DIAGNOSIS — I10 Essential (primary) hypertension: Secondary | ICD-10-CM | POA: Diagnosis not present

## 2018-06-19 NOTE — Progress Notes (Signed)
Name: Rachel James   MRN: [id number]    DOB: October 05, 1946   Date:06/19/2018       Progress Note  Subjective  Chief Complaint  Chief Complaint  Patient presents with  . Hypertension    BP 133/81 this morning yesterday 145/93   . Joint Swelling    HPI  She increased metoprolol from 25mg  to 75mg  and started amlodipine 5mg  after coming off of chlorthalidone due to leg cramping.  She has been having a mild headache some days but not all the time.  BP was 145/90's this week.  She does have some mild BLE swelling that improves overnight and returns slowly throughout the day.  Endorses occasional shortness of breath while laying down at night - questions if this is anxiety.  Occasional nausea.   Denies chest pain, vision changes, vomiting, diarrhea. Stressors: In January she had arthroscopic surgery on the right shoulder and it is worse now after PT; may need a total replacement.   DASH diet discussed - she doesn't eat a lot of salt in her diet; rarely eats fried/fatty foods. Does not exercise. Notes she does have a sweet tooth but does not eat black licorice.  Patient Active Problem List   Diagnosis Date Noted  . Hyponatremia 03/31/2018  . Injury of tendon of long head of right biceps 11/01/2017  . Rotator cuff tendinitis, right 10/11/2017  . Arthritis of right glenohumeral joint 09/27/2017  . Chronic right shoulder pain 09/26/2017  . CKD (chronic kidney disease) stage 2, GFR 60-89 ml/min 07/11/2017  . Osteopenia 06/22/2017  . 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 Shoulder pain, bilateral 09/08/2016  . Need for prophylactic vaccination with Streptococcus pneumoniae (Pneumococcus) and Influenza vaccines 08/16/2016  . Preventative health care 08/16/2016  . Screen for STD (sexually transmitted disease) 08/16/2016  . Urinary frequency 08/16/2016  . Medication monitoring encounter 07/20/2016  . Cutaneous skin tags 04/18/2016  . Accidental fall 12/09/2015  .  Hyperlipidemia LDL goal <100 09/17/2015  . Need for immunization against influenza 09/17/2015  . Degenerative arthritis of lumbar spine with cord compression 07/16/2015  . Lumbar and sacral osteoarthritis 07/16/2015  . Bilateral change in hearing 05/27/2015  . Spinal stenosis, multilevel 05/27/2015  . GERD (gastroesophageal reflux disease) 05/27/2015  . Chronic radicular low back pain 05/27/2015  . Allergic rhinitis 01/06/2014  . Osteoarthrosis involving multiple sites 01/06/2014  . Arthropathia 01/06/2014  . Hypertension goal BP (blood pressure) < 140/90 01/06/2014  . Arthritis of right knee 01/06/2014    Social History   Tobacco Use  . Smoking status: Never Smoker  . Smokeless tobacco: Never Used  Substance Use Topics  . Alcohol use: Yes    Alcohol/week: 0.0 oz    Comment: maybe 3 drinks per month     Current Outpatient Medications:  .  Alpha-D-Galactosidase (BEANO) TABS, Take 1 tablet by mouth 2 (two) times daily., Disp: , Rfl:  .  amLODipine (NORVASC) 5 MG tablet, Take 1 tablet (5 mg total) by mouth daily., Disp: 90 tablet, Rfl: 3 .  aspirin 81 MG tablet, Take 81 mg by mouth 3 (three) times a week. , Disp: , Rfl:  .  cholecalciferol (VITAMIN D) 1000 units tablet, Take 1 tablet (1,000 Units total) by mouth daily., Disp: , Rfl:  .  DOCUSATE SODIUM PO, Take by mouth daily as needed., Disp: , Rfl:  .  HYDROcodone-acetaminophen (NORCO/VICODIN) 5-325 MG tablet, Take 1-2 tablets by mouth every 6 (six) hours as needed for moderate  pain., Disp: 40 tablet, Rfl: 0 .  metoprolol succinate (TOPROL-XL) 50 MG 24 hr tablet, Take 1 tablet (50 mg total) by mouth daily. Take with or immediately following a meal., Disp: 90 tablet, Rfl: 3 .  naproxen sodium (ANAPROX) 220 MG tablet, Take 220 mg by mouth 2 (two) times daily with a meal., Disp: , Rfl:  .  simvastatin (ZOCOR) 10 MG tablet, Take 1 tablet (10 mg total) by mouth daily., Disp: 90 tablet, Rfl: 1 .  gabapentin (NEURONTIN) 300 MG capsule,  One by mouth every morning and one every evening for 3-5 days, then just one every evening for 3-5 days, then stop, Disp: , Rfl:  .  Magnesium Oxide 500 MG TABS, One by mouth daily, Disp: , Rfl:   Allergies  Allergen Reactions  . Codeine Other (See Comments) and Itching    nightmare  . Oxycodone Itching    (if taken for long period of time)    ROS   Ten systems reviewed and is negative except as mentioned in HPI  Objective  Vitals:   06/19/18 1324  BP: 118/82  Pulse: 68  Resp: 16  Temp: 98.1 F (36.7 C)  TempSrc: Oral  SpO2: 98%  Weight: 135 lb 1.6 oz (61.3 kg)  Height: 5\' 3"  (1.6 m)   Body mass index is 23.93 kg/m.  Nursing Note and Vital Signs reviewed.  Physical Exam  Constitutional: Patient appears well-developed and well-nourished. Obese No distress.  HEENT: head atraumatic, normocephalic Cardiovascular: Normal rate, regular rhythm, S1/S2 present.  No murmur or rub heard. Trace edema to right foot, very trace to left foot; non-pitting. Pulmonary/Chest: Effort normal and breath sounds clear. No respiratory distress or retractions. Neurological: he is alert and oriented to person, place, and time. No cranial nerve deficit. Coordination, balance, strength, speech and gait are normal.  Skin: Skin is warm and dry. No rash noted. No erythema.  Psychiatric: Patient has a normal mood and affect. behavior is normal. Judgment and thought content normal.  No results found for this or any previous visit (from the past 72 hour(s)).  Assessment & Plan  1. Hypertension goal BP (blood pressure) < 563/89 - Basic Metabolic Panel (BMET) - B Nat Peptide - DASH diet  - Take metoprolol at night and norvasc in the morning.  Bring BP cuff in tomorrow to calibrate.  We will hold off on dose changes until labs are returned and home cuff is compared to our office's manual blood pressure reading due to BP being well within goal today.  -Red flags and when to present for emergency care  or RTC including fever >101.64F, chest pain, shortness of breath, new/worsening/un-resolving symptoms, BLE edema that is worse than normal reviewed with patient at time of visit. Follow up and care instructions discussed and provided in AVS.

## 2018-06-19 NOTE — Patient Instructions (Addendum)
Take Metoprolol at night before bedtime and Amlodipine in the morning.   Bring your Blood pressure cuff in to the office as soon as possible so that we can compare to our manual blood pressure reading. (Nurse Visit) DASH Eating Plan DASH stands for "Dietary Approaches to Stop Hypertension." The DASH eating plan is a healthy eating plan that has been shown to reduce high blood pressure (hypertension). It may also reduce your risk for type 2 diabetes, heart disease, and stroke. The DASH eating plan may also help with weight loss. What are tips for following this plan? General guidelines  Avoid eating more than 2,300 mg (milligrams) of salt (sodium) a day. If you have hypertension, you may need to reduce your sodium intake to 1,500 mg a day.  Limit alcohol intake to no more than 1 drink a day for nonpregnant women and 2 drinks a day for men. One drink equals 12 oz of beer, 5 oz of wine, or 1 oz of hard liquor.  Work with your health care provider to maintain a healthy body weight or to lose weight. Ask what an ideal weight is for you.  Get at least 30 minutes of exercise that causes your heart to beat faster (aerobic exercise) most days of the week. Activities may include walking, swimming, or biking.  Work with your health care provider or diet and nutrition specialist (dietitian) to adjust your eating plan to your individual calorie needs. Reading food labels  Check food labels for the amount of sodium per serving. Choose foods with less than 5 percent of the Daily Value of sodium. Generally, foods with less than 300 mg of sodium per serving fit into this eating plan.  To find whole grains, look for the word "whole" as the first word in the ingredient list. Shopping  Buy products labeled as "low-sodium" or "no salt added."  Buy fresh foods. Avoid canned foods and premade or frozen meals. Cooking  Avoid adding salt when cooking. Use salt-free seasonings or herbs instead of table salt or  sea salt. Check with your health care provider or pharmacist before using salt substitutes.  Do not fry foods. Cook foods using healthy methods such as baking, boiling, grilling, and broiling instead.  Cook with heart-healthy oils, such as olive, canola, soybean, or sunflower oil. Meal planning   Eat a balanced diet that includes: ? 5 or more servings of fruits and vegetables each day. At each meal, try to fill half of your plate with fruits and vegetables. ? Up to 6-8 servings of whole grains each day. ? Less than 6 oz of lean meat, poultry, or fish each day. A 3-oz serving of meat is about the same size as a deck of cards. One egg equals 1 oz. ? 2 servings of low-fat dairy each day. ? A serving of nuts, seeds, or beans 5 times each week. ? Heart-healthy fats. Healthy fats called Omega-3 fatty acids are found in foods such as flaxseeds and coldwater fish, like sardines, salmon, and mackerel.  Limit how much you eat of the following: ? Canned or prepackaged foods. ? Food that is high in trans fat, such as fried foods. ? Food that is high in saturated fat, such as fatty meat. ? Sweets, desserts, sugary drinks, and other foods with added sugar. ? Full-fat dairy products.  Do not salt foods before eating.  Try to eat at least 2 vegetarian meals each week.  Eat more home-cooked food and less restaurant, buffet, and fast food.  When eating at a restaurant, ask that your food be prepared with less salt or no salt, if possible. What foods are recommended? The items listed may not be a complete list. Talk with your dietitian about what dietary choices are best for you. Grains Whole-grain or whole-wheat bread. Whole-grain or whole-wheat pasta. Brown rice. [place]row. Bulgur. Whole-grain and low-sodium cereals. Pita bread. Low-fat, low-sodium crackers. Whole-wheat flour tortillas. Vegetables Fresh or frozen vegetables (raw, steamed, roasted, or grilled). Low-sodium or reduced-sodium  tomato and vegetable juice. Low-sodium or reduced-sodium tomato sauce and tomato paste. Low-sodium or reduced-sodium canned vegetables. Fruits All fresh, dried, or frozen fruit. Canned fruit in natural juice (without added sugar). Meat and other protein foods Skinless chicken or [place]. Ground chicken or [place]. Pork with fat trimmed off. Fish and seafood. Egg whites. Dried beans, peas, or lentils. Unsalted nuts, nut butters, and seeds. Unsalted canned beans. Lean cuts of beef with fat trimmed off. Low-sodium, lean deli meat. Dairy Low-fat (1%) or fat-free (skim) milk. Fat-free, low-fat, or reduced-fat cheeses. Nonfat, low-sodium ricotta or cottage cheese. Low-fat or nonfat yogurt. Low-fat, low-sodium cheese. Fats and oils Soft margarine without trans fats. Vegetable oil. Low-fat, reduced-fat, or light mayonnaise and salad dressings (reduced-sodium). Canola, safflower, olive, soybean, and sunflower oils. Avocado. Seasoning and other foods Herbs. Spices. Seasoning mixes without salt. Unsalted popcorn and pretzels. Fat-free sweets. What foods are not recommended? The items listed may not be a complete list. Talk with your dietitian about what dietary choices are best for you. Grains Baked goods made with fat, such as croissants, muffins, or some breads. Dry pasta or rice meal packs. Vegetables Creamed or fried vegetables. Vegetables in a cheese sauce. Regular canned vegetables (not low-sodium or reduced-sodium). Regular canned tomato sauce and paste (not low-sodium or reduced-sodium). Regular tomato and vegetable juice (not low-sodium or reduced-sodium). Angie Fava. Olives. Fruits Canned fruit in a light or heavy syrup. Fried fruit. Fruit in cream or butter sauce. Meat and other protein foods Fatty cuts of meat. Ribs. Fried meat. Berniece Salines. Sausage. Bologna and other processed lunch meats. Salami. Fatback. Hotdogs. Bratwurst. Salted nuts and seeds. Canned beans with added salt. Canned or smoked fish.  Whole eggs or egg yolks. Chicken or [place] with skin. Dairy Whole or 2% milk, cream, and half-and-half. Whole or full-fat cream cheese. Whole-fat or sweetened yogurt. Full-fat cheese. Nondairy creamers. Whipped toppings. Processed cheese and cheese spreads. Fats and oils Butter. Stick margarine. Lard. Shortening. Ghee. Bacon fat. Tropical oils, such as coconut, palm kernel, or palm oil. Seasoning and other foods Salted popcorn and pretzels. Onion salt, garlic salt, seasoned salt, table salt, and sea salt. Worcestershire sauce. Tartar sauce. Barbecue sauce. Teriyaki sauce. Soy sauce, including reduced-sodium. Steak sauce. Canned and packaged gravies. Fish sauce. Oyster sauce. Cocktail sauce. Horseradish that you find on the shelf. Ketchup. Mustard. Meat flavorings and tenderizers. Bouillon cubes. Hot sauce and Tabasco sauce. Premade or packaged marinades. Premade or packaged taco seasonings. Relishes. Regular salad dressings. Where to find more information:  National Heart, Lung, and [place]: https://wilson-eaton.com/  American Heart Association: www.heart.org Summary  The DASH eating plan is a healthy eating plan that has been shown to reduce high blood pressure (hypertension). It may also reduce your risk for type 2 diabetes, heart disease, and stroke.  With the DASH eating plan, you should limit salt (sodium) intake to 2,300 mg a day. If you have hypertension, you may need to reduce your sodium intake to 1,500 mg a day.  When on the DASH eating plan,  aim to eat more fresh fruits and vegetables, whole grains, lean proteins, low-fat dairy, and heart-healthy fats.  Work with your health care provider or diet and nutrition specialist (dietitian) to adjust your eating plan to your individual calorie needs. This information is not intended to replace advice given to you by your health care provider. Make sure you discuss any questions you have with your health care provider. Document Released:  11/24/2011 Document Revised: 11/28/2016 Document Reviewed: 11/28/2016 Elsevier Interactive Patient Education  [employer].

## 2018-06-20 ENCOUNTER — Telehealth: Payer: Self-pay

## 2018-06-20 ENCOUNTER — Ambulatory Visit: Payer: PPO

## 2018-06-20 VITALS — BP 128/72 | HR 65

## 2018-06-20 DIAGNOSIS — I10 Essential (primary) hypertension: Secondary | ICD-10-CM

## 2018-06-20 LAB — BASIC METABOLIC PANEL
BUN: 16 mg/dL (ref 7–25)
CHLORIDE: 102 mmol/L (ref 98–110)
CO2: 29 mmol/L (ref 20–32)
Calcium: 9.9 mg/dL (ref 8.6–10.4)
Creat: 0.82 mg/dL (ref 0.60–0.93)
Glucose, Bld: 84 mg/dL (ref 65–139)
POTASSIUM: 4.4 mmol/L (ref 3.5–5.3)
Sodium: 138 mmol/L (ref 135–146)

## 2018-06-20 LAB — BRAIN NATRIURETIC PEPTIDE: Brain Natriuretic Peptide: 67 pg/mL (ref <100)

## 2018-06-20 NOTE — Progress Notes (Signed)
Patient came into the office to see if we could check her BP against her machine's because she has been getting some elevated readings.  I made a copy of her BP log for our records.  I consulted with Dr. Sanda Klein and she recommended that I forward this information to Raelyn Ensign, FNP since she saw her during an office visit on yesterday (06/19/18).

## 2018-06-20 NOTE — Telephone Encounter (Signed)
ERROR

## 2018-06-28 ENCOUNTER — Encounter: Payer: Self-pay | Admitting: Family Medicine

## 2018-06-28 DIAGNOSIS — N182 Chronic kidney disease, stage 2 (mild): Secondary | ICD-10-CM

## 2018-06-28 DIAGNOSIS — Z5181 Encounter for therapeutic drug level monitoring: Secondary | ICD-10-CM

## 2018-06-28 MED ORDER — METOPROLOL SUCCINATE ER 25 MG PO TB24: 75.0000 mg | ORAL_TABLET | Freq: Every day | ORAL | 1 refills | Status: DC

## 2018-06-28 MED ORDER — METOPROLOL SUCCINATE ER 50 MG PO TB24: 75.0000 mg | ORAL_TABLET | Freq: Every day | ORAL | 3 refills | Status: DC

## 2018-06-28 MED ORDER — LOSARTAN POTASSIUM 50 MG PO TABS: 50.0000 mg | ORAL_TABLET | Freq: Every day | ORAL | 0 refills | Status: DC

## 2018-07-05 ENCOUNTER — Encounter: Payer: Self-pay | Admitting: Family Medicine

## 2018-07-05 ENCOUNTER — Ambulatory Visit (INDEPENDENT_AMBULATORY_CARE_PROVIDER_SITE_OTHER): Payer: PPO | Admitting: Family Medicine

## 2018-07-05 VITALS — BP 122/80 | HR 72 | Temp 98.5°F | Resp 12 | Ht 63.0 in | Wt 135.7 lb

## 2018-07-05 DIAGNOSIS — M19011 Primary osteoarthritis, right shoulder: Secondary | ICD-10-CM

## 2018-07-05 DIAGNOSIS — E785 Hyperlipidemia, unspecified: Secondary | ICD-10-CM

## 2018-07-05 DIAGNOSIS — H04129 Dry eye syndrome of unspecified lacrimal gland: Secondary | ICD-10-CM

## 2018-07-05 DIAGNOSIS — J3089 Other allergic rhinitis: Secondary | ICD-10-CM

## 2018-07-05 DIAGNOSIS — Z5181 Encounter for therapeutic drug level monitoring: Secondary | ICD-10-CM

## 2018-07-05 DIAGNOSIS — I10 Essential (primary) hypertension: Secondary | ICD-10-CM

## 2018-07-05 DIAGNOSIS — N182 Chronic kidney disease, stage 2 (mild): Secondary | ICD-10-CM | POA: Diagnosis not present

## 2018-07-05 LAB — BASIC METABOLIC PANEL
BUN/Creatinine Ratio: 14 (calc) (ref 6–22)
BUN: 13 mg/dL (ref 7–25)
CALCIUM: 9.4 mg/dL (ref 8.6–10.4)
CHLORIDE: 105 mmol/L (ref 98–110)
CO2: 29 mmol/L (ref 20–32)
Creat: 0.94 mg/dL — ABNORMAL HIGH (ref 0.60–0.93)
Glucose, Bld: 72 mg/dL (ref 65–139)
POTASSIUM: 4.4 mmol/L (ref 3.5–5.3)
SODIUM: 141 mmol/L (ref 135–146)

## 2018-07-05 MED ORDER — HYDROCODONE-ACETAMINOPHEN 5-325 MG PO TABS: 1.0000 | ORAL_TABLET | Freq: Four times a day (QID) | ORAL | Status: DC | PRN

## 2018-07-05 NOTE — Assessment & Plan Note (Signed)
Doing well with beta-blocker and ARB; swelling has resolved with cessation of the CCB; will check BMP today with the ARB; she has monitor

## 2018-07-05 NOTE — Assessment & Plan Note (Signed)
Check creatinine and K+ on the ARB

## 2018-07-05 NOTE — Assessment & Plan Note (Signed)
Not due again until October

## 2018-07-05 NOTE — Assessment & Plan Note (Signed)
Suggested cutting out all dairy for one month; anti-histamines may further dry her out

## 2018-07-05 NOTE — Assessment & Plan Note (Signed)
Planning for surgery in August

## 2018-07-05 NOTE — Patient Instructions (Signed)
Try to follow the DASH guidelines (DASH stands for Dietary Approaches to Stop Hypertension). Try to limit the sodium in your diet to no more than 1,500mg of sodium per day. Certainly try to not exceed 2,000 mg per day at the very most. Do not add salt when cooking or at the table.  Check the sodium amount on labels when shopping, and choose items lower in sodium when given a choice. Avoid or limit foods that already contain a lot of sodium. Eat a diet rich in fruits and vegetables and whole grains, and try to lose weight if overweight or obese  

## 2018-07-05 NOTE — Assessment & Plan Note (Signed)
ARB should provide renal protection; check BMP

## 2018-07-05 NOTE — Progress Notes (Signed)
BP 122/80   Pulse 72   Temp 98.5 F (36.9 C) (Oral)   Resp 12   Ht 5\' 3"  (1.6 m)   Wt 135 lb 11.2 oz (61.6 kg)   SpO2 94%   BMI 24.04 kg/m    Subjective:    Patient ID: Rachel James, female    DOB: 10/14/1946, 72 y.o.   MRN: [id number]  HPI: Rachel James is a 72 y.o. female  Chief Complaint  Patient presents with  . Follow-up    HPI Patient is here for f/u of hypertension See the e-mail notes that have been communicated back and forth about her blood pressure and pulse readings and medicine changes She stopped the amlodipine and the swelling in the feet and legs are all better She was so tired yesterday, but not sure if not sleeping well She has tried switching up the timing of the medicine Losartan at night with one 25 mg metoprolol Taking 50 mg of metoprolol in the morning Feeling pretty good today Did not sleep well last night either though; wakes up 3:30 am and had trouble going back to sleep; benadryl and then slept in until 9 am; sleep trouble is probably related to her shoulder she thinks; also wakes up after dreams; shoulder is just killing her sometimes; tries to loosen it up; takes tylenol 500 mg two BID right now; also taking naproxen Trying to limit salt intake; talked about stress, depends on what it is; some things don't roll away; working with self-help group; that is helping She does have dry eye and some fatigue; "since starting losartan?" I asked; she says the fatigue is chronic; she has not seen eye doctor in a while; some dry mouth too, after brushing teeth, uses mouthwash, and then dry mouth; makes saliva; stuffy nose and sneezing at times; just taking benadryl at night, not during the day; not interested in honey or allergy testing; already had testing done a few years ago, dust mites, one grass; she gets throat clearing with dairy products (avoid dairy for one month I told her) Last BMP reviewed  High cholesterol; taking statin Lab Results    Component Value Date   CHOL 183 03/28/2018   HDL 83 03/28/2018   LDLCALC 84 03/28/2018   TRIG 72 03/28/2018   CHOLHDL 2.2 03/28/2018   Going to have right shoulder replacement August 22nd; really limited in ability to move and do things; right-handed; surgery is set  Depression screen [facility] 2/9 07/05/2018 03/28/2018 09/26/2017 07/11/2017 03/27/2017  Decreased Interest 0 0 0 0 0  Down, Depressed, Hopeless 0 0 0 0 0  PHQ - 2 Score 0 0 0 0 0    Relevant past medical, surgical, family and social history reviewed Past Medical History:  Diagnosis Date  . Allergy   . Arthritis    knees, lower back  . CKD (chronic kidney disease) stage 2, GFR 60-89 ml/min 07/11/2017  . Degenerative disc disease, lumbar   . Hypertension   . Osteopenia 06/22/2017   June 2016 DEXA  . PONV (postoperative nausea and vomiting)    in past.  none recently   Past Surgical History:  Procedure Laterality Date  .  THUMB SURGERY Left   . BREAST BIOPSY Right 1992   neg  . BREAST LUMPECTOMY Right   . BUNIONECTOMY Bilateral   . CERVICAL DISCECTOMY  09/18/2012   C4-C5 AND C6-C7 ACDF   . SHOULDER ARTHROSCOPY WITH ROTATOR CUFF REPAIR AND OPEN BICEPS TENODESIS Right 12/20/2017  Procedure: SHOULDER ARTHROSCOPY WITH DEBRIDEMENT, DECOMPRESSION AND  BICEPS TENODESIS;  Surgeon: Corky Mull, MD;  Location: [facility];  Service: Orthopedics;  Laterality: Right;  . TUBAL LIGATION Bilateral    Family History  Problem Relation Age of Onset  . Heart disease Mother   . Heart attack Mother   . Heart disease Father   . Heart disease Sister   . Heart disease Brother   . Heart failure Brother   . Memory loss Brother   . Diabetes Paternal Grandmother   . Pulmonary embolism Sister   . Breast cancer Neg Hx    Social History   Tobacco Use  . Smoking status: Never Smoker  . Smokeless tobacco: Never Used  Substance Use Topics  . Alcohol use: Yes    Alcohol/week: 0.0 oz    Comment: maybe 3 drinks per month  . Drug use:  No    Interim medical history since last visit reviewed. Allergies and medications reviewed  Review of Systems Per HPI unless specifically indicated above     Objective:    BP 122/80   Pulse 72   Temp 98.5 F (36.9 C) (Oral)   Resp 12   Ht 5\' 3"  (1.6 m)   Wt 135 lb 11.2 oz (61.6 kg)   SpO2 94%   BMI 24.04 kg/m   Wt Readings from Last 3 Encounters:  07/05/18 135 lb 11.2 oz (61.6 kg)  06/19/18 135 lb 1.6 oz (61.3 kg)  03/28/18 136 lb 12.8 oz (62.1 kg)    Physical Exam  Constitutional: She appears well-developed and well-nourished.  HENT:  Mouth/Throat: Mucous membranes are normal.  Eyes: EOM are normal. No scleral icterus.  Cardiovascular: Normal rate and regular rhythm.  Pulmonary/Chest: Effort normal and breath sounds normal.  Musculoskeletal: She exhibits no edema.       Right shoulder: She exhibits decreased range of motion.  Psychiatric: She has a normal mood and affect. Her behavior is normal.    Results for orders placed or performed in visit on 40/98/11  Basic Metabolic Panel (BMET)  Result Value Ref Range   Glucose, Bld 84 65 - 139 mg/dL   BUN 16 7 - 25 mg/dL   Creat 0.82 0.60 - 0.93 mg/dL   BUN/Creatinine Ratio NOT APPLICABLE 6 - 22 (calc)   Sodium 138 135 - 146 mmol/L   Potassium 4.4 3.5 - 5.3 mmol/L   Chloride 102 98 - 110 mmol/L   CO2 29 20 - 32 mmol/L   Calcium 9.9 8.6 - 10.4 mg/dL  B Nat Peptide  Result Value Ref Range   Brain Natriuretic Peptide 67 <100 pg/mL      Assessment & Plan:   Problem List Items Addressed This Visit      Cardiovascular and Mediastinum   Hypertension goal BP (blood pressure) < 140/90 - Primary (Chronic)    Doing well with beta-blocker and ARB; swelling has resolved with cessation of the CCB; will check BMP today with the ARB; she has monitor        Respiratory   Allergic rhinitis    Suggested cutting out all dairy for one month; anti-histamines may further dry her out        Musculoskeletal and Integument    Arthritis of right glenohumeral joint    Planning for surgery in August      Relevant Medications   HYDROcodone-acetaminophen (NORCO/VICODIN) 5-325 MG tablet     Genitourinary   CKD (chronic kidney disease) stage 2, GFR 60-89 ml/min  ARB should provide renal protection; check BMP        Other   Medication monitoring encounter    Check creatinine and K+ on the ARB      Hyperlipidemia LDL goal <100 (Chronic)    Not due again until October       Other Visit Diagnoses    Dry eye       suggested she see eye professional; no real dry mouth so doubt Sjogren's       Follow up plan: No follow-ups on file.  An after-visit summary was printed and given to the patient at [facility].  Please see the patient instructions which may contain other information and recommendations beyond what is mentioned above in the assessment and plan.  Meds ordered this encounter  Medications  . HYDROcodone-acetaminophen (NORCO/VICODIN) 5-325 MG tablet    Sig: Take 1-2 tablets by mouth every 6 (six) hours as needed for moderate pain.    No orders of the defined types were placed in this encounter.

## 2018-07-06 ENCOUNTER — Other Ambulatory Visit: Payer: Self-pay | Admitting: Family Medicine

## 2018-07-06 MED ORDER — LOSARTAN POTASSIUM 50 MG PO TABS: 50.0000 mg | ORAL_TABLET | Freq: Every day | ORAL | 1 refills | Status: DC

## 2018-07-06 NOTE — Progress Notes (Signed)
Reviewed Cr and K+ Rx approved 

## 2018-07-10 ENCOUNTER — Encounter: Payer: Self-pay | Admitting: Family Medicine

## 2018-07-10 DIAGNOSIS — M1612 Unilateral primary osteoarthritis, left hip: Secondary | ICD-10-CM | POA: Diagnosis not present

## 2018-07-10 MED ORDER — TIZANIDINE HCL 4 MG PO TABS: 4.0000 mg | ORAL_TABLET | Freq: Three times a day (TID) | ORAL | 0 refills | Status: DC | PRN

## 2018-07-10 NOTE — Telephone Encounter (Signed)
Please clean up pharmacies so we just have one local pharmacy

## 2018-07-11 ENCOUNTER — Encounter: Payer: Self-pay | Admitting: Family Medicine

## 2018-07-12 ENCOUNTER — Encounter: Payer: Self-pay | Admitting: Family Medicine

## 2018-07-12 ENCOUNTER — Telehealth: Payer: Self-pay

## 2018-07-12 ENCOUNTER — Telehealth: Payer: Self-pay | Admitting: Family Medicine

## 2018-07-12 MED ORDER — LOSARTAN POTASSIUM 50 MG PO TABS: 50.0000 mg | ORAL_TABLET | Freq: Every day | ORAL | 0 refills | Status: DC

## 2018-07-12 MED ORDER — METOPROLOL SUCCINATE ER 25 MG PO TB24: ORAL_TABLET | ORAL | 0 refills | Status: DC

## 2018-07-12 MED ORDER — METOPROLOL SUCCINATE ER 25 MG PO TB24: ORAL_TABLET | ORAL | 1 refills | Status: DC

## 2018-07-12 NOTE — Telephone Encounter (Signed)
Already done

## 2018-07-12 NOTE — Telephone Encounter (Signed)
Pt.notified

## 2018-07-12 NOTE — Telephone Encounter (Signed)
Copied from [place] [phone]. Topic: General - Other >> Jul 11, 2018  3:05 PM Carolyn Stare wrote: Pt call to say [facility] did not send her a Rx for the month of July because the Rx they were received was not correct.  Pt said she is taking 75mg  but she is taking 2 pills in the morning and 1 at night and the pharmacy said that is what the RX should say.   .  metoprolol succinate (TOPROL-XL) 25 MG 24 hr tablet  Pt ask if a 30 day supply can be sent to her local pharmacy [facility] and the correct Rx sent to [facility]

## 2018-07-12 NOTE — Telephone Encounter (Signed)
I updated the med list and sent new Rx

## 2018-07-12 NOTE — Telephone Encounter (Signed)
[facility] called and said they never received the refill fo metoprolol. It looks like it was sent to the mail order and patient asked that it please go to [facility]

## 2018-07-12 NOTE — Addendum Note (Signed)
Addended by: Hiroki Wint, Satira Anis on: 07/12/2018 05:05 PM   Modules accepted: Orders

## 2018-07-12 NOTE — Telephone Encounter (Signed)
It was not already sent to [facility] I sent the Rx to mail order, but patient is requesting Rx to fill locally New Rx sent to [facility]

## 2018-07-12 NOTE — Telephone Encounter (Signed)
Copied from [place] [id number]. Topic: Quick Communication - Rx Refill/Question >> Jul 12, 2018  1:07 PM Mcneil, Ja-Kwan wrote: Medication: metoprolol succinate (TOPROL-XL) 25 MG 24 hr tablet  Pt states she will be going out of town and need a 30 day supply because the Rx sent to [facility] will not be received in time before she leaves going out of town.   Preferred Pharmacy (with phone number or street name): [facility] [place]. [phone] (Phone) [phone] (Fax)   Agent: Please be advised that RX refills may take up to 3 business days. We ask that you follow-up with your pharmacy.

## 2018-07-13 NOTE — Telephone Encounter (Signed)
Rachel James, please see her note about her preferred pharmacy and let's clean up her list Thank you

## 2018-07-13 NOTE — Telephone Encounter (Signed)
Pt callied in and stated [employer] will not approve the Vacation Override for the Metropolol

## 2018-07-20 ENCOUNTER — Other Ambulatory Visit: Payer: Self-pay

## 2018-07-20 MED ORDER — LOSARTAN POTASSIUM 50 MG PO TABS: 50.0000 mg | ORAL_TABLET | Freq: Every day | ORAL | 0 refills | Status: DC

## 2018-07-20 NOTE — Telephone Encounter (Signed)
Copied from [place]. Topic: Quick Communication - See Telephone Encounter >> Jul 20, 2018  9:59 AM Antonieta Iba C wrote: CRM for notification. See Telephone encounter for: 07/20/18.  Pt called in to request to have her Rx for the Losartan sent to a local pharmacy instead. Pt says that she is out of town on vacation and need her Rx sent to [facility] in [place], [place] phone #: [phone]   Please assist further.

## 2018-08-02 NOTE — Pre-Procedure Instructions (Signed)
Rachel James  08/02/2018      Rachel James, Rachel James [address] Phone: [phone] Fax: [place] #[id number] Rachel James, [facility] [address] [place] [address] Phone: [phone] Fax: [phone]    Your procedure is scheduled on Aug.22  Report to [facility] Admitting at 10:45 A.M.  Call this number if you have problems the morning of surgery:  [phone]   Remember:  Do not eat or drink after midnight.      Take these medicines the morning of surgery with A SIP OF WATER :               Tylenol if needed              Metoprolol (toprolol-xl)              Tizanidine(zanaflex)                   7 days prior to surgery STOP taking  Aleve, Naproxen, Ibuprofen, Motrin, Advil, Goody's, BC's, all herbal medications, fish oil, and all vitamins                 Follow your surgeon's instructions on when to stop Asprin.  If no instructions were given by your surgeon then you will need to call the office to get those instructions.                   Do not wear jewelry, make-up or nail polish.  Do not wear lotions, powders, or perfumes, or deodorant.  Do not shave 48 hours prior to surgery.  Men may shave face and neck.  Do not bring valuables to the hospital.  [facility] is not responsible for any belongings or valuables.  Contacts, dentures or bridgework may not be worn into surgery.  Leave your suitcase in the car.  After surgery it may be brought to your room.  For patients admitted to the hospital, discharge time will be determined by your treatment team.  Patients discharged the day of surgery will not be allowed to drive home.    Special instructions:  [facility]- Preparing For Surgery  Before surgery, you can play an important role. Because skin is not sterile, your skin needs to be as free of germs as possible. You can reduce the  number of germs on your skin by washing with CHG (chlorahexidine gluconate) Soap before surgery.  CHG is an antiseptic cleaner which kills germs and bonds with the skin to continue killing germs even after washing.    Oral Hygiene is also important to reduce your risk of infection.  Remember - BRUSH YOUR TEETH THE MORNING OF SURGERY WITH YOUR REGULAR TOOTHPASTE  Please do not use if you have an allergy to CHG or antibacterial soaps. If your skin becomes reddened/irritated stop using the CHG.  Do not shave (including legs and underarms) for at least 48 hours prior to first CHG shower. It is OK to shave your face.  Please follow these instructions carefully.   1. Shower the NIGHT BEFORE SURGERY and the MORNING OF SURGERY with CHG.   2. If you chose to wash your hair, wash your hair first as usual with your normal shampoo.  3. After you shampoo, rinse your hair and body thoroughly to remove the shampoo.  4.  Use CHG as you would any other liquid soap. You can apply CHG directly to the skin and wash gently with a scrungie or a clean washcloth.   5. Apply the CHG Soap to your body ONLY FROM THE NECK DOWN.  Do not use on open wounds or open sores. Avoid contact with your eyes, ears, mouth and genitals (private parts). Wash Face and genitals (private parts)  with your normal soap.  6. Wash thoroughly, paying special attention to the area where your surgery will be performed.  7. Thoroughly rinse your body with warm water from the neck down.  8. DO NOT shower/wash with your normal soap after using and rinsing off the CHG Soap.  9. Pat yourself dry with a CLEAN TOWEL.  10. Wear CLEAN PAJAMAS to bed the night before surgery, wear comfortable clothes the morning of surgery  11. Place CLEAN SHEETS on your bed the night of your first shower and DO NOT SLEEP WITH PETS.    Day of Surgery:  Do not apply any deodorants/lotions.  Please wear clean clothes to the hospital/surgery center.   Remember  to brush your teeth WITH YOUR REGULAR TOOTHPASTE.    Please read over the following fact sheets that you were given. Coughing and Deep Breathing, MRSA Information and Surgical Site Infection Prevention

## 2018-08-03 ENCOUNTER — Other Ambulatory Visit: Payer: Self-pay

## 2018-08-03 ENCOUNTER — Encounter (HOSPITAL_COMMUNITY)
Admission: RE | Admit: 2018-08-03 | Discharge: 2018-08-03 | Disposition: A | Discharge status: Home / Self Care | Payer: PPO | Source: Ambulatory Visit | Attending: Orthopedic Surgery | Admitting: Orthopedic Surgery

## 2018-08-03 ENCOUNTER — Encounter (HOSPITAL_COMMUNITY): Payer: Self-pay

## 2018-08-03 DIAGNOSIS — Z01812 Encounter for preprocedural laboratory examination: Secondary | ICD-10-CM | POA: Diagnosis not present

## 2018-08-03 DIAGNOSIS — Z01818 Encounter for other preprocedural examination: Secondary | ICD-10-CM | POA: Diagnosis not present

## 2018-08-03 DIAGNOSIS — I10 Essential (primary) hypertension: Secondary | ICD-10-CM | POA: Diagnosis not present

## 2018-08-03 LAB — BASIC METABOLIC PANEL
Anion gap: 10 (ref 5–15)
BUN: 18 mg/dL (ref 8–23)
CALCIUM: 9.3 mg/dL (ref 8.9–10.3)
CHLORIDE: 105 mmol/L (ref 98–111)
CO2: 23 mmol/L (ref 22–32)
CREATININE: 0.96 mg/dL (ref 0.44–1.00)
GFR calc Af Amer: >60 mL/min (ref >60)
GFR calc non Af Amer: 59 mL/min — ABNORMAL LOW (ref >60)
Glucose, Bld: 164 mg/dL — ABNORMAL HIGH (ref 70–99)
Potassium: 4 mmol/L (ref 3.5–5.1)
Sodium: 138 mmol/L (ref 135–145)

## 2018-08-03 LAB — SURGICAL PCR SCREEN
MRSA, PCR: NEGATIVE
Staphylococcus aureus: NEGATIVE

## 2018-08-03 LAB — CBC
HCT: 37.4 % (ref 36.0–46.0)
Hemoglobin: 12.2 g/dL (ref 12.0–15.0)
MCH: 30.5 pg (ref 26.0–34.0)
MCHC: 32.6 g/dL (ref 30.0–36.0)
MCV: 93.5 fL (ref 78.0–100.0)
Platelets: 226 10*3/uL (ref 150–400)
RBC: 4 MIL/uL (ref 3.87–5.11)
RDW: 13.7 % (ref 11.5–15.5)
WBC: 6.3 10*3/uL (ref 4.0–10.5)

## 2018-08-03 NOTE — Progress Notes (Signed)
PCP - Dr Sanda Klein Cardiologist - denies  EKG - 08/03/2018   Aspirin Instructions: Last dose ASA Wednesday 8/14  Anesthesia review:   Patient denies shortness of breath, fever, cough and chest pain at PAT appointment   Patient verbalized understanding of instructions that were given to them at the PAT appointment. Patient was also instructed that they will need to review over the PAT instructions again at home before surgery.

## 2018-08-07 ENCOUNTER — Ambulatory Visit
Admission: RE | Admit: 2018-08-07 | Discharge: 2018-08-07 | Disposition: A | Discharge status: Home / Self Care | Payer: PPO | Source: Ambulatory Visit | Attending: Family Medicine | Admitting: Family Medicine

## 2018-08-07 DIAGNOSIS — Z1231 Encounter for screening mammogram for malignant neoplasm of breast: Secondary | ICD-10-CM | POA: Insufficient documentation

## 2018-08-08 MED ORDER — TRANEXAMIC ACID 1000 MG/10ML IV SOLN
1000.0000 mg | INTRAVENOUS | Status: AC
  Administered 2018-08-09: 1000 mg via INTRAVENOUS
  Filled 2018-08-08: qty 1100

## 2018-08-09 ENCOUNTER — Inpatient Hospital Stay (HOSPITAL_COMMUNITY): Payer: PPO | Admitting: Certified Registered Nurse Anesthetist

## 2018-08-09 ENCOUNTER — Inpatient Hospital Stay (HOSPITAL_COMMUNITY)
Admission: RE | Admit: 2018-08-09 | Discharge: 2018-08-10 | DRG: 483 | Disposition: A | Discharge status: Home / Self Care | Payer: PPO | Source: Ambulatory Visit | Attending: Orthopedic Surgery | Admitting: Orthopedic Surgery

## 2018-08-09 ENCOUNTER — Encounter (HOSPITAL_COMMUNITY): Payer: Self-pay | Admitting: Certified Registered Nurse Anesthetist

## 2018-08-09 ENCOUNTER — Encounter (HOSPITAL_COMMUNITY)
Admission: RE | Disposition: A | Discharge status: Home / Self Care | Payer: Self-pay | Source: Ambulatory Visit | Attending: Orthopedic Surgery

## 2018-08-09 DIAGNOSIS — Z7982 Long term (current) use of aspirin: Secondary | ICD-10-CM

## 2018-08-09 DIAGNOSIS — M19011 Primary osteoarthritis, right shoulder: Principal | ICD-10-CM | POA: Diagnosis present

## 2018-08-09 DIAGNOSIS — I129 Hypertensive chronic kidney disease with stage 1 through stage 4 chronic kidney disease, or unspecified chronic kidney disease: Secondary | ICD-10-CM | POA: Diagnosis present

## 2018-08-09 DIAGNOSIS — G8918 Other acute postprocedural pain: Secondary | ICD-10-CM | POA: Diagnosis not present

## 2018-08-09 DIAGNOSIS — N183 Chronic kidney disease, stage 3 (moderate): Secondary | ICD-10-CM | POA: Diagnosis not present

## 2018-08-09 DIAGNOSIS — N182 Chronic kidney disease, stage 2 (mild): Secondary | ICD-10-CM | POA: Diagnosis not present

## 2018-08-09 DIAGNOSIS — Z96619 Presence of unspecified artificial shoulder joint: Secondary | ICD-10-CM

## 2018-08-09 HISTORY — PX: TOTAL SHOULDER ARTHROPLASTY: SHX126

## 2018-08-09 SURGERY — ARTHROPLASTY, SHOULDER, TOTAL
Anesthesia: Regional | Site: Shoulder | Laterality: Right

## 2018-08-09 MED ORDER — ONDANSETRON HCL 4 MG/2ML IJ SOLN: 4.0000 mg | Freq: Four times a day (QID) | INTRAMUSCULAR | Status: DC | PRN

## 2018-08-09 MED ORDER — ACETAMINOPHEN 325 MG PO TABS: 325.0000 mg | ORAL_TABLET | Freq: Four times a day (QID) | ORAL | Status: DC | PRN

## 2018-08-09 MED ORDER — FLEET ENEMA 7-19 GM/118ML RE ENEM: 1.0000 | ENEMA | Freq: Once | RECTAL | Status: DC | PRN

## 2018-08-09 MED ORDER — ONDANSETRON HCL 4 MG PO TABS: 4.0000 mg | ORAL_TABLET | Freq: Four times a day (QID) | ORAL | Status: DC | PRN

## 2018-08-09 MED ORDER — FENTANYL CITRATE (PF) 100 MCG/2ML IJ SOLN
INTRAMUSCULAR | Status: AC
  Filled 2018-08-09: qty 2

## 2018-08-09 MED ORDER — MIDAZOLAM HCL 2 MG/2ML IJ SOLN
INTRAMUSCULAR | Status: AC
  Filled 2018-08-09: qty 2

## 2018-08-09 MED ORDER — DEXAMETHASONE SODIUM PHOSPHATE 10 MG/ML IJ SOLN
INTRAMUSCULAR | Status: DC | PRN
  Administered 2018-08-09: 8 mg via INTRAVENOUS

## 2018-08-09 MED ORDER — BUPIVACAINE HCL (PF) 0.5 % IJ SOLN
INTRAMUSCULAR | Status: DC | PRN
  Administered 2018-08-09: 20 mL via PERINEURAL

## 2018-08-09 MED ORDER — FENTANYL CITRATE (PF) 100 MCG/2ML IJ SOLN
INTRAMUSCULAR | Status: DC | PRN
  Administered 2018-08-09: 50 ug via INTRAVENOUS

## 2018-08-09 MED ORDER — POLYETHYLENE GLYCOL 3350 17 G PO PACK: 17.0000 g | PACK | Freq: Every day | ORAL | Status: DC | PRN

## 2018-08-09 MED ORDER — CEFAZOLIN SODIUM-DEXTROSE 2-4 GM/100ML-% IV SOLN
2.0000 g | INTRAVENOUS | Status: AC
  Administered 2018-08-09: 2 g via INTRAVENOUS
  Filled 2018-08-09: qty 100

## 2018-08-09 MED ORDER — LIDOCAINE 2% (20 MG/ML) 5 ML SYRINGE
INTRAMUSCULAR | Status: DC | PRN
  Administered 2018-08-09: 40 mg via INTRAVENOUS

## 2018-08-09 MED ORDER — SCOPOLAMINE 1 MG/3DAYS TD PT72
MEDICATED_PATCH | TRANSDERMAL | Status: DC | PRN
  Administered 2018-08-09: 1 via TRANSDERMAL

## 2018-08-09 MED ORDER — LIDOCAINE 2% (20 MG/ML) 5 ML SYRINGE
INTRAMUSCULAR | Status: AC
  Filled 2018-08-09: qty 5

## 2018-08-09 MED ORDER — PROPOFOL 10 MG/ML IV BOLUS
INTRAVENOUS | Status: DC | PRN
  Administered 2018-08-09: 150 mg via INTRAVENOUS

## 2018-08-09 MED ORDER — SCOPOLAMINE 1 MG/3DAYS TD PT72
MEDICATED_PATCH | TRANSDERMAL | Status: AC
  Filled 2018-08-09: qty 1

## 2018-08-09 MED ORDER — MIDAZOLAM HCL 5 MG/5ML IJ SOLN
INTRAMUSCULAR | Status: DC | PRN
  Administered 2018-08-09: 1 mg via INTRAVENOUS

## 2018-08-09 MED ORDER — MENTHOL 3 MG MT LOZG: 1.0000 | LOZENGE | OROMUCOSAL | Status: DC | PRN

## 2018-08-09 MED ORDER — METOPROLOL SUCCINATE ER 50 MG PO TB24
50.0000 mg | ORAL_TABLET | Freq: Every day | ORAL | Status: DC
  Administered 2018-08-09 – 2018-08-10 (×2): 50 mg via ORAL
  Filled 2018-08-09 (×2): qty 1

## 2018-08-09 MED ORDER — FENTANYL CITRATE (PF) 100 MCG/2ML IJ SOLN
50.0000 ug | Freq: Once | INTRAMUSCULAR | Status: AC
  Administered 2018-08-09: 50 ug via INTRAVENOUS

## 2018-08-09 MED ORDER — CHLORHEXIDINE GLUCONATE 4 % EX LIQD: 60.0000 mL | Freq: Once | CUTANEOUS | Status: DC

## 2018-08-09 MED ORDER — LACTATED RINGERS IV SOLN
INTRAVENOUS | Status: DC
  Administered 2018-08-09: 12:00:00 via INTRAVENOUS

## 2018-08-09 MED ORDER — BISACODYL 5 MG PO TBEC: 5.0000 mg | DELAYED_RELEASE_TABLET | Freq: Every day | ORAL | Status: DC | PRN

## 2018-08-09 MED ORDER — KETOROLAC TROMETHAMINE 15 MG/ML IJ SOLN
7.5000 mg | Freq: Four times a day (QID) | INTRAMUSCULAR | Status: DC
  Administered 2018-08-09 – 2018-08-10 (×3): 7.5 mg via INTRAVENOUS
  Filled 2018-08-09 (×3): qty 1

## 2018-08-09 MED ORDER — 0.9 % SODIUM CHLORIDE (POUR BTL) OPTIME
TOPICAL | Status: DC | PRN
  Administered 2018-08-09: 1000 mL

## 2018-08-09 MED ORDER — DIPHENHYDRAMINE HCL 12.5 MG/5ML PO ELIX: 12.5000 mg | ORAL_SOLUTION | ORAL | Status: DC | PRN

## 2018-08-09 MED ORDER — METOCLOPRAMIDE HCL 5 MG PO TABS: 5.0000 mg | ORAL_TABLET | Freq: Three times a day (TID) | ORAL | Status: DC | PRN

## 2018-08-09 MED ORDER — HYDRALAZINE HCL 20 MG/ML IJ SOLN
5.0000 mg | Freq: Once | INTRAMUSCULAR | Status: AC
  Administered 2018-08-09: 5 mg via INTRAVENOUS

## 2018-08-09 MED ORDER — MIDAZOLAM HCL 2 MG/2ML IJ SOLN
2.0000 mg | Freq: Once | INTRAMUSCULAR | Status: AC
  Administered 2018-08-09: 2 mg via INTRAVENOUS

## 2018-08-09 MED ORDER — DOCUSATE SODIUM 100 MG PO CAPS
100.0000 mg | ORAL_CAPSULE | Freq: Two times a day (BID) | ORAL | Status: DC
  Administered 2018-08-09 – 2018-08-10 (×2): 100 mg via ORAL
  Filled 2018-08-09 (×2): qty 1

## 2018-08-09 MED ORDER — ONDANSETRON HCL 4 MG/2ML IJ SOLN
INTRAMUSCULAR | Status: AC
  Filled 2018-08-09: qty 2

## 2018-08-09 MED ORDER — PHENOL 1.4 % MT LIQD: 1.0000 | OROMUCOSAL | Status: DC | PRN

## 2018-08-09 MED ORDER — DEXAMETHASONE SODIUM PHOSPHATE 10 MG/ML IJ SOLN
INTRAMUSCULAR | Status: AC
  Filled 2018-08-09: qty 1

## 2018-08-09 MED ORDER — LACTATED RINGERS IV SOLN
INTRAVENOUS | Status: DC
  Administered 2018-08-09: 18:00:00 via INTRAVENOUS

## 2018-08-09 MED ORDER — METOCLOPRAMIDE HCL 5 MG/ML IJ SOLN: 5.0000 mg | Freq: Three times a day (TID) | INTRAMUSCULAR | Status: DC | PRN

## 2018-08-09 MED ORDER — HYDROMORPHONE HCL 1 MG/ML IJ SOLN: 0.5000 mg | INTRAMUSCULAR | Status: DC | PRN

## 2018-08-09 MED ORDER — HYDROMORPHONE HCL 1 MG/ML IJ SOLN: 0.2500 mg | INTRAMUSCULAR | Status: DC | PRN

## 2018-08-09 MED ORDER — METOPROLOL SUCCINATE ER 25 MG PO TB24
25.0000 mg | ORAL_TABLET | Freq: Every day | ORAL | Status: DC
  Administered 2018-08-09: 25 mg via ORAL
  Filled 2018-08-09: qty 1

## 2018-08-09 MED ORDER — HYDROCODONE-ACETAMINOPHEN 7.5-325 MG PO TABS: 1.0000 | ORAL_TABLET | ORAL | Status: DC | PRN

## 2018-08-09 MED ORDER — FENTANYL CITRATE (PF) 250 MCG/5ML IJ SOLN
INTRAMUSCULAR | Status: AC
  Filled 2018-08-09: qty 5

## 2018-08-09 MED ORDER — ALUM & MAG HYDROXIDE-SIMETH 200-200-20 MG/5ML PO SUSP: 30.0000 mL | ORAL | Status: DC | PRN

## 2018-08-09 MED ORDER — SODIUM CHLORIDE 0.9 % IV SOLN
INTRAVENOUS | Status: DC | PRN
  Administered 2018-08-09: 20 ug/min via INTRAVENOUS

## 2018-08-09 MED ORDER — GLYCOPYRROLATE PF 0.2 MG/ML IJ SOSY
PREFILLED_SYRINGE | INTRAMUSCULAR | Status: DC | PRN
  Administered 2018-08-09 (×2): .1 mg via INTRAVENOUS

## 2018-08-09 MED ORDER — ONDANSETRON HCL 4 MG/2ML IJ SOLN
INTRAMUSCULAR | Status: DC | PRN
  Administered 2018-08-09: 4 mg via INTRAVENOUS

## 2018-08-09 MED ORDER — HYDRALAZINE HCL 20 MG/ML IJ SOLN
INTRAMUSCULAR | Status: AC
  Filled 2018-08-09: qty 1

## 2018-08-09 MED ORDER — BUPIVACAINE LIPOSOME 1.3 % IJ SUSP
INTRAMUSCULAR | Status: DC | PRN
  Administered 2018-08-09: 10 mL via PERINEURAL

## 2018-08-09 MED ORDER — LOSARTAN POTASSIUM 50 MG PO TABS
50.0000 mg | ORAL_TABLET | Freq: Every evening | ORAL | Status: DC
  Administered 2018-08-09: 50 mg via ORAL
  Filled 2018-08-09: qty 1

## 2018-08-09 MED ORDER — METOPROLOL SUCCINATE ER 25 MG PO TB24: 25.0000 mg | ORAL_TABLET | ORAL | Status: DC

## 2018-08-09 MED ORDER — TIZANIDINE HCL 4 MG PO TABS: 4.0000 mg | ORAL_TABLET | Freq: Three times a day (TID) | ORAL | Status: DC | PRN

## 2018-08-09 MED ORDER — ASPIRIN 81 MG PO CHEW
81.0000 mg | CHEWABLE_TABLET | ORAL | Status: DC
  Administered 2018-08-10: 81 mg via ORAL
  Filled 2018-08-09: qty 1

## 2018-08-09 MED ORDER — PROPOFOL 10 MG/ML IV BOLUS
INTRAVENOUS | Status: AC
  Filled 2018-08-09: qty 20

## 2018-08-09 MED ORDER — PROMETHAZINE HCL 25 MG/ML IJ SOLN: 6.2500 mg | INTRAMUSCULAR | Status: DC | PRN

## 2018-08-09 MED ORDER — HYDROCODONE-ACETAMINOPHEN 5-325 MG PO TABS: 1.0000 | ORAL_TABLET | ORAL | Status: DC | PRN

## 2018-08-09 SURGICAL SUPPLY — 59 items
BIT DRILL 5/64X5 DISP (BIT) ×2 IMPLANT
BLADE SAW SGTL 83.5X18.5 (BLADE) ×2 IMPLANT
CEMENT BONE DEPUY (Cement) ×2 IMPLANT
COVER SURGICAL LIGHT HANDLE (MISCELLANEOUS) ×2 IMPLANT
DERMABOND ADHESIVE PROPEN (GAUZE/BANDAGES/DRESSINGS) ×1
DERMABOND ADVANCED .7 DNX6 (GAUZE/BANDAGES/DRESSINGS) ×1 IMPLANT
DRAPE ORTHO SPLIT 77X108 STRL (DRAPES) ×2
DRAPE SURG 17X11 SM STRL (DRAPES) ×2 IMPLANT
DRAPE SURG ORHT 6 SPLT 77X108 (DRAPES) ×2 IMPLANT
DRAPE U-SHAPE 47X51 STRL (DRAPES) ×2 IMPLANT
DRSG AQUACEL AG ADV 3.5X10 (GAUZE/BANDAGES/DRESSINGS) ×2 IMPLANT
DURAPREP 26ML APPLICATOR (WOUND CARE) ×2 IMPLANT
ELECT BLADE 4.0 EZ CLEAN MEGAD (MISCELLANEOUS) ×2
ELECT CAUTERY BLADE 6.4 (BLADE) ×2 IMPLANT
ELECT REM PT RETURN 9FT ADLT (ELECTROSURGICAL) ×2
ELECTRODE BLDE 4.0 EZ CLN MEGD (MISCELLANEOUS) ×1 IMPLANT
ELECTRODE REM PT RTRN 9FT ADLT (ELECTROSURGICAL) ×1 IMPLANT
FACESHIELD WRAPAROUND (MASK) ×6 IMPLANT
GLENOID WITH CLEAT MEDIUM (Shoulder) ×2 IMPLANT
GLOVE BIO SURGEON STRL SZ7.5 (GLOVE) ×2 IMPLANT
GLOVE BIO SURGEON STRL SZ8 (GLOVE) ×2 IMPLANT
GLOVE EUDERMIC 7 POWDERFREE (GLOVE) ×2 IMPLANT
GLOVE SS BIOGEL STRL SZ 7.5 (GLOVE) ×1 IMPLANT
GLOVE SUPERSENSE BIOGEL SZ 7.5 (GLOVE) ×1
GOWN STRL REUS W/ TWL LRG LVL3 (GOWN DISPOSABLE) ×1 IMPLANT
GOWN STRL REUS W/ TWL XL LVL3 (GOWN DISPOSABLE) ×2 IMPLANT
GOWN STRL REUS W/TWL LRG LVL3 (GOWN DISPOSABLE) ×1
GOWN STRL REUS W/TWL XL LVL3 (GOWN DISPOSABLE) ×2
HEAD HUM UNIV 15X45 3D (Head) ×2 IMPLANT
KIT BASIN OR (CUSTOM PROCEDURE TRAY) ×2 IMPLANT
KIT SET UNIVERSAL (KITS) ×2 IMPLANT
KIT TURNOVER KIT B (KITS) ×2 IMPLANT
MANIFOLD NEPTUNE II (INSTRUMENTS) ×2 IMPLANT
NEEDLE TAPERED W/ NITINOL LOOP (MISCELLANEOUS) ×2 IMPLANT
NS IRRIG 1000ML POUR BTL (IV SOLUTION) ×2 IMPLANT
PACK SHOULDER (CUSTOM PROCEDURE TRAY) ×2 IMPLANT
PAD ARMBOARD 7.5X6 YLW CONV (MISCELLANEOUS) ×4 IMPLANT
RESTRAINT HEAD UNIVERSAL NS (MISCELLANEOUS) ×2 IMPLANT
SLING ARM FOAM STRAP LRG (SOFTGOODS) IMPLANT
SLING ARM FOAM STRAP MED (SOFTGOODS) ×2 IMPLANT
SLING ARM XL FOAM STRAP (SOFTGOODS) ×2 IMPLANT
SMARTMIX MINI TOWER (MISCELLANEOUS) ×2
SPONGE LAP 18X18 X RAY DECT (DISPOSABLE) ×2 IMPLANT
SPONGE LAP 4X18 RFD (DISPOSABLE) ×2 IMPLANT
STEM HUMERAL UNI APEX 10MM (Shoulder) ×2 IMPLANT
SUCTION FRAZIER HANDLE 10FR (MISCELLANEOUS) ×1
SUCTION TUBE FRAZIER 10FR DISP (MISCELLANEOUS) ×1 IMPLANT
SUT FIBERWIRE #2 38 REV NDL BL (SUTURE) ×2
SUT MNCRL AB 3-0 PS2 18 (SUTURE) ×2 IMPLANT
SUT MON AB 2-0 CT1 36 (SUTURE) ×2 IMPLANT
SUT VIC AB 1 CT1 27 (SUTURE) ×3
SUT VIC AB 1 CT1 27XBRD ANBCTR (SUTURE) ×3 IMPLANT
SUTURE FIBERWR#2 38 REV NDL BL (SUTURE) ×1 IMPLANT
SUTURE TAPE 1.3 40 TPR END (SUTURE) ×3 IMPLANT
SUTURETAPE 1.3 40 TPR END (SUTURE) ×6
SYR CONTROL 10ML LL (SYRINGE) IMPLANT
TOWEL OR 17X26 10 PK STRL BLUE (TOWEL DISPOSABLE) ×2 IMPLANT
TOWER SMARTMIX MINI (MISCELLANEOUS) ×1 IMPLANT
WATER STERILE IRR 1000ML POUR (IV SOLUTION) ×2 IMPLANT

## 2018-08-09 NOTE — Anesthesia Preprocedure Evaluation (Signed)
Anesthesia Evaluation  Patient identified by MRN, date of birth, ID band Patient awake    Reviewed: Allergy & Precautions, NPO status , Patient's Chart, lab work & pertinent test results  History of Anesthesia Complications (+) PONV and history of anesthetic complications  Airway Mallampati: I  TM Distance: >3 FB Neck ROM: Full    Dental no notable dental hx.    Pulmonary neg pulmonary ROS,    Pulmonary exam normal breath sounds clear to auscultation       Cardiovascular Exercise Tolerance: Good hypertension, Pt. on medications Normal cardiovascular exam Rhythm:Regular Rate:Normal     Neuro/Psych negative neurological ROS     GI/Hepatic GERD  ,  Endo/Other  negative endocrine ROS  Renal/GU Renal InsufficiencyRenal disease (stage III CKD)     Musculoskeletal  (+) Arthritis , Osteoarthritis,  Spinal stenosis   Abdominal   Peds  Hematology negative hematology ROS (+)   Anesthesia Other Findings   Reproductive/Obstetrics                             Anesthesia Physical  Anesthesia Plan  ASA: II  Anesthesia Plan: General and Regional   Post-op Pain Management:  Regional for Post-op pain and GA combined w/ Regional for post-op pain   Induction: Intravenous  PONV Risk Score and Plan: 4 or greater and Scopolamine patch - Pre-op, Dexamethasone, Ondansetron, Midazolam and Treatment may vary due to age or medical condition  Airway Management Planned: LMA  Additional Equipment:   Intra-op Plan:   Post-operative Plan: Extubation in OR  Informed Consent: I have reviewed the patients History and Physical, chart, labs and discussed the procedure including the risks, benefits and alternatives for the proposed anesthesia with the patient or authorized representative who has indicated his/her understanding and acceptance.     Plan Discussed with: CRNA  Anesthesia Plan Comments:          Anesthesia Quick Evaluation

## 2018-08-09 NOTE — Anesthesia Postprocedure Evaluation (Signed)
Anesthesia Post Note  Patient: Rachel James  Procedure(s) Performed: RIGHT TOTAL SHOULDER ARTHROPLASTY (Right Shoulder)     Patient location during evaluation: PACU Anesthesia Type: Regional and General Level of consciousness: awake and alert Pain management: pain level controlled Vital Signs Assessment: post-procedure vital signs reviewed and stable Respiratory status: spontaneous breathing, nonlabored ventilation and respiratory function stable Cardiovascular status: blood pressure returned to baseline and stable Postop Assessment: no apparent nausea or vomiting Anesthetic complications: no    Last Vitals:  Vitals:   08/09/18 1512 08/09/18 1537  BP: (!) 167/97 (!) 177/93  Pulse: 66   Resp: 16   Temp:    SpO2: 100%     Last Pain:  Vitals:   08/09/18 1515  TempSrc:   PainSc: 0-No pain                 Lynda Rainwater

## 2018-08-09 NOTE — Discharge Instructions (Signed)
° °Kevin M. Supple, M.D., F.A.A.O.S. °Orthopaedic Surgery °Specializing in Arthroscopic and Reconstructive °Surgery of the Shoulder and Knee °[phone] °[address] - [place], [place] [address] - Fax [phone] ° ° °POST-OP TOTAL SHOULDER REPLACEMENT INSTRUCTIONS ° °1. Call the office at [phone] to schedule your first post-op appointment 10-14 days from the date of your surgery. ° °2. The bandage over your incision is waterproof. You may begin showering with this dressing on. You may leave this dressing on until first follow up appointment within 2 weeks. We prefer you leave this dressing in place until follow up however after 5-7 days if you are having itching or skin irritation and would like to remove it you may do so. Go slow and tug at the borders gently to break the bond the dressing has with the skin. At this point if there is no drainage it is okay to go without a bandage or you may cover it with a light guaze and tape. You can also expect significant bruising around your shoulder that will drift down your arm and into your chest wall. This is very normal and should resolve over several days. ° ° 3. Wear your sling/immobilizer at all times except to perform the exercises below or to occasionally let your arm dangle by your side to stretch your elbow. You also need to sleep in your sling immobilizer until instructed otherwise. ° °4. Range of motion to your elbow, wrist, and hand are encouraged 3-5 times daily. Exercise to your hand and fingers helps to reduce swelling you may experience. ° °5. Utilize ice to the shoulder 3-5 times minimum a day and additionally if you are experiencing pain. ° °6. Prescriptions for a pain medication and a muscle relaxant are provided for you. It is recommended that if you are experiencing pain that you pain medication alone is not controlling, add the muscle relaxant along with the pain medication which can give additional pain relief. The first 1-2 days  is generally the most severe of your pain and then should gradually decrease. As your pain lessens it is recommended that you decrease your use of the pain medications to an "as needed basis'" only and to always comply with the recommended dosages of the pain medications. ° °7. Pain medications can produce constipation along with their use. If you experience this, the use of an over the counter stool softener or laxative daily is recommended.  ° °8. For additional questions or concerns, please do not hesitate to call the office. If after hours there is an answering service to forward your concerns to the physician on call. ° °9.Pain control following an exparel block ° °To help control your post-operative pain you received a nerve block  performed with Exparel which is a long acting anesthetic (numbing agent) which can provide pain relief and sensations of numbness (and relief of pain) in the operative shoulder and arm for up to 3 days. Sometimes it provides mixed relief, meaning you may still have numbness in certain areas of the arm but can still be able to move  parts of that arm, hand, and fingers. We recommend that your prescribed pain medications  be used as needed. We do not feel it is necessary to "pre medicate" and "stay ahead" of pain.  Taking narcotic pain medications when you are not having any pain can lead to unnecessary and potentially dangerous side effects.  ° °POST-OP EXERCISES ° °Pendulum Exercises ° °Perform pendulum exercises while standing and bending at   the waist. Support your uninvolved arm on a table or chair and allow your operated arm to hang freely. Make sure to do these exercises passively - not using you shoulder muscles. ° °Repeat 20 times. Do 3 sessions per day. ° ° ° ° °

## 2018-08-09 NOTE — Anesthesia Procedure Notes (Signed)
Procedure Name: LMA Insertion Date/Time: 08/09/2018 12:32 PM Performed by: Verdie Drown, CRNA Pre-anesthesia Checklist: Patient identified, Emergency Drugs available, Suction available, Patient being monitored and Timeout performed Patient Re-evaluated:Patient Re-evaluated prior to induction Oxygen Delivery Method: Circle system utilized Preoxygenation: Pre-oxygenation with 100% oxygen Induction Type: IV induction Ventilation: Mask ventilation without difficulty LMA: LMA inserted LMA Size: 4.0 Number of attempts: 1 Placement Confirmation: positive ETCO2,  CO2 detector and breath sounds checked- equal and bilateral Tube secured with: Tape Dental Injury: Teeth and Oropharynx as per pre-operative assessment  Comments: LMA inserted by Violet Baldy

## 2018-08-09 NOTE — H&P (Signed)
Rachel James    Chief Complaint: right shoulder osteoarthritis HPI: The patient is a 73 y.o. female with end stage right shoulder OA  Past Medical History:  Diagnosis Date  . Allergy   . Arthritis    knees, lower back  . CKD (chronic kidney disease) stage 2, GFR 60-89 ml/min 07/11/2017  . Degenerative disc disease, lumbar   . Hypertension   . Osteopenia 06/22/2017   June 2016 DEXA  . PONV (postoperative nausea and vomiting)    in past.  none recently    Past Surgical History:  Procedure Laterality Date  .  THUMB SURGERY Left   . APPENDECTOMY    . BREAST BIOPSY Right 1992   neg  . BREAST LUMPECTOMY Right   . BUNIONECTOMY Bilateral   . CERVICAL DISCECTOMY  09/18/2012   C4-C5 AND C6-C7 ACDF   . SHOULDER ARTHROSCOPY WITH ROTATOR CUFF REPAIR AND OPEN BICEPS TENODESIS Right 12/20/2017   Procedure: SHOULDER ARTHROSCOPY WITH DEBRIDEMENT, DECOMPRESSION AND  BICEPS TENODESIS;  Surgeon: Corky Mull, MD;  Location: [facility];  Service: Orthopedics;  Laterality: Right;  . TUBAL LIGATION Bilateral     Family History  Problem Relation Age of Onset  . Heart disease Mother   . Heart attack Mother   . Heart disease Father   . Heart disease Sister   . Heart disease Brother   . Heart failure Brother   . Memory loss Brother   . Diabetes Paternal Grandmother   . Pulmonary embolism Sister   . Breast cancer Neg Hx     Social History:  reports that she has never smoked. She has never used smokeless tobacco. She reports that she drinks alcohol. She reports that she does not use drugs.   Medications Prior to Admission  Medication Sig Dispense Refill  . acetaminophen (TYLENOL) 500 MG tablet Take 1,000 mg by mouth 2 (two) times daily.    Marland Kitchen aspirin 81 MG tablet Take 81 mg by mouth every Monday, Wednesday, and Friday.     . cholecalciferol (VITAMIN D) 1000 units tablet Take 1 tablet (1,000 Units total) by mouth daily.    Marland Kitchen docusate sodium (COLACE) 100 MG capsule Take 100 mg by mouth  daily.    Marland Kitchen losartan (COZAAR) 50 MG tablet Take 1 tablet (50 mg total) by mouth daily. (Patient taking differently: Take 50 mg by mouth every evening. ) 30 tablet 0  . metoprolol succinate (TOPROL-XL) 25 MG 24 hr tablet Take two pills in the morning and one pill at night (Patient taking differently: Take 25-50 mg by mouth See admin instructions. Take 50 mg by mouth in the morning and take 25 mg by mouth at bedtime) 90 tablet 0  . Multiple Vitamins-Minerals (MULTIVITAMIN PO) Take 1 tablet by mouth daily.    . naproxen sodium (ANAPROX) 220 MG tablet Take 440 mg by mouth 2 (two) times daily.     . simvastatin (ZOCOR) 10 MG tablet Take 1 tablet (10 mg total) by mouth daily. 90 tablet 1  . tiZANidine (ZANAFLEX) 4 MG tablet Take 1 tablet (4 mg total) by mouth every 8 (eight) hours as needed for muscle spasms. 30 tablet 0  . HYDROcodone-acetaminophen (NORCO/VICODIN) 5-325 MG tablet Take 1-2 tablets by mouth every 6 (six) hours as needed for moderate pain. (Patient not taking: Reported on 08/01/2018)       Physical Exam: right shoulder with painful and restricted motion as noted at recent office visits  Vitals  Temp:  [97.7 F (  36.5 C)] 97.7 F (36.5 C) (08/22 1055) Pulse Rate:  [63] 63 (08/22 1055) Resp:  [18] 18 (08/22 1055) BP: (155)/(86) 155/86 (08/22 1055) SpO2:  [97 %] 97 % (08/22 1055)  Assessment/Plan  Impression: right shoulder osteoarthritis  Plan of Action: Procedure(s): RIGHT TOTAL SHOULDER ARTHROPLASTY  Rachel James 08/09/2018, 11:46 AM Contact # [phone]

## 2018-08-09 NOTE — Op Note (Signed)
08/09/2018  2:41 PM  PATIENT:   Rachel James  72 y.o. female  PRE-OPERATIVE DIAGNOSIS:  right shoulder osteoarthritis  POST-OPERATIVE DIAGNOSIS:  same  PROCEDURE:  R TSA #10 stem, 45x15 concentric head, medium glenoid  SURGEON:  Donyea Beverlin, Metta Clines M.D.  ASSISTANTS: Shuford pac   ANESTHESIA:   LMA + ISB  EBL: 200  SPECIMEN:  none  Drains: none   PATIENT DISPOSITION:  PACU - hemodynamically stable.    PLAN OF CARE: Admit for overnight observation  Brief history:  Rachel James has been followed for end-stage right shoulder arthritis with progressively increasing pain and functional limitations.  She is brought to the operating this time for planned right total shoulder arthroplasty  Preoperatively and counseled Rachel James regarding treatment options as well as potential risks versus benefits throughout.  Possible surgical complications were reviewed including potential bleeding, infection, neurovascular injury, persistent pain, anesthetic complication, failure the implant, and possible need for additional surgery.  She understands and accepts and agrees with her planned procedure.  Procedure in detail:  After undergoing routine preop evaluation patient received prophylactic antibiotics and an interscalene block was established with Exparel by the anesthesia department holding area.  Brought to the operating placed supine on the operative table underwent smooth induction of a LMA general anesthesia.  Placed in the beachchair position and appropriately padded and protected.  The right shoulder Rachel James region was then sterilely prepped and draped in standard fashion.  Timeout was called.  An anterior deltopectoral approach to the right shoulder was made through an 8 cm incision.  Skin flaps were elevated dissection carried deeply cephalic vein was taken laterally with the deltoid pectoralis major tendon taken medially with the upper centimeter the pectoralis major tendon tenotomized to  improve exposure.  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  The conjoined tendon was then mobilized and retracted medially.  She had a previous tenodesis long head biceps tendon which we did reinforce.  Remnant of the bicep tendon proximally was then unroofed and then we split the rotator cuff along the rotator interval towards the base of the coracoid and then identified the subscapularis insertion on the lesser tuberosity and then using oscillating saw to initiate our lesser tuberosity osteotomy which we performed removing the wafer of bone perhaps 3 to 4 mm thick at most.  We then mobilized the subscapularis and divided the capsule attachments from the anterior inferior and inferior aspects of the humeral neck allowing [place] to deliver the humeral head to the wound.  We did tagged the subscapularis then the bone fleck and tendon was then reflected medially.  We outlined our proposed humeral head resection using the extra medullary guide and this was then performed with an oscillating saw mimicking the native retroversion of approximate 20 to 30 degrees.  We carefully protect the rotator cuff superiorly and posteriorly.  At this point we then prepared the humeral canal with hand reaming size 7 broaching up to size 10 with excellent fit.  Size 9 trial with metal cap placed in the humeral canal then turned our attention to the glenoid which we exposed with combination of Fukuda, pitchfork, and stick tongue retractors.  I performed a circumferential labral resection gaining complete visualization of the glenoid surface.  A guidepin was then placed into the center of the glenoid after determining the size medium glenoid is the  best fit.  The glenoid was then prepared reaming down to subchondral bone and then the central drill hole followed by the superior and inferior peg  and slot respectively were all prepared the glenoid was then broached and trial showed excellent fit.  Then the glenoid was then irrigated cleaned and dried we mixed cement introduced into this into the superior and inferior peg and slot respectively and our glenoid was then impacted with excellent fit and fixation.  We then returned our attention to the proximal humerus where we placed drill holes one medial to our lesser tuberosity osteotomy and 2 in the bicipital groove for our ultimate repair of the tuberosity.  We then passed a series of sutures to the eyelets on our humeral stem which this was completed the suture limbs were then shuttled through the bone tunnels we created on the proximal humerus and with this completed we then impacted our humeral stem to the proper depth obtaining excellent interference fit fixation of the proximal locking screws were then tightened.  We then performed a series of trial reductions and ultimately felt that the concentric 45 x 15 head gave [place] excellent soft tissue balance with approximate 50% translation humeral head and glenoid.  The final 45 x 15 head was then impacted onto the [facility] taper after the [facility] taper was cleaned and dried.  Final reduction was performed in overall soft tissue balance and position was felt to be excellent.  At this point we then passed our suture limbs through the bone tendon injection medially and then the suture limbs were all then sewn to one another allowing [place] to create a very solid repair of the lesser tuberosity osteotomy bone fragment back to the metaphysis of the proximal humerus and very solid fixation was achieved and then repaired the rotator interval with a series of figure-of-eight suture tape sutures.  At this point the shoulder easily be externally rotated 45 degrees without excessive tension on the subscapularis repair.  Wound was then irrigated hemostasis was obtained.  The deltopectoral interval was then repaired with a series  of figure written with Vicryl sutures.  2-0 Vicryl used with subcu layer intracuticular through Monocryl for the skin followed by Dermabond and Aquasol dressing right arm was then placed in a sling the patient was awakened extubated taken recovery room in stable condition  [facility], PA-C was used as an Environmental consultant throughout this case essential for help with positioning the patient, position extremity, tissue manipulation, implantation prosthesis, wound closure, and intraoperative decision-making.  Marin Shutter, MD.   Contact # [phone]

## 2018-08-09 NOTE — Anesthesia Procedure Notes (Signed)
Anesthesia Regional Block: Interscalene brachial plexus block   Pre-Anesthetic Checklist: ,, timeout performed, Correct Patient, Correct Site, Correct Laterality, Correct Procedure, Correct Position, site marked, Risks and benefits discussed,  Surgical consent,  Pre-op evaluation,  At surgeon's request and post-op pain management  Laterality: Right  Prep: chloraprep       Needles:  Injection technique: Single-shot  Needle Type: Stimiplex     Needle Length: 9cm  Needle Gauge: 21     Additional Needles:   Procedures:,,,, ultrasound used (permanent image in chart),,,,  Narrative:  Start time: 08/09/2018 12:14 PM End time: 08/09/2018 12:19 PM Injection made incrementally with aspirations every 5 mL.  Performed by: Personally  Anesthesiologist: Lynda Rainwater, MD

## 2018-08-09 NOTE — Plan of Care (Signed)

## 2018-08-09 NOTE — Transfer of Care (Signed)
Immediate Anesthesia Transfer of Care Note  Patient: Rachel James  Procedure(s) Performed: RIGHT TOTAL SHOULDER ARTHROPLASTY (Right Shoulder)  Patient Location: PACU  Anesthesia Type:General and Regional  Level of Consciousness: awake, patient cooperative and responds to stimulation  Airway & Oxygen Therapy: Patient Spontanous Breathing and Patient connected to face mask oxygen  Post-op Assessment: Report given to RN and Post -op Vital signs reviewed and stable  Post vital signs: Reviewed and stable  Last Vitals:  Vitals Value Taken Time  BP    Temp    Pulse 148 08/09/2018  2:42 PM  Resp 16 08/09/2018  2:42 PM  SpO2 100 % 08/09/2018  2:42 PM  Vitals shown include unvalidated device data.  Last Pain:  Vitals:   08/09/18 1149  TempSrc:   PainSc: 2          Complications: No apparent anesthesia complications

## 2018-08-10 ENCOUNTER — Encounter (HOSPITAL_COMMUNITY): Payer: Self-pay | Admitting: Orthopedic Surgery

## 2018-08-10 ENCOUNTER — Other Ambulatory Visit: Payer: Self-pay

## 2018-08-10 MED ORDER — HYDROCODONE-ACETAMINOPHEN 5-325 MG PO TABS: 1.0000 | ORAL_TABLET | Freq: Four times a day (QID) | ORAL | 0 refills | Status: DC | PRN

## 2018-08-10 MED ORDER — TIZANIDINE HCL 4 MG PO TABS: 4.0000 mg | ORAL_TABLET | Freq: Three times a day (TID) | ORAL | 0 refills | Status: DC | PRN

## 2018-08-10 NOTE — Discharge Summary (Signed)
PATIENT ID:      Rachel James  MRN:     [id number] DOB/AGE:    70-12-1945 / 70 y.o.     DISCHARGE SUMMARY  ADMISSION DATE:    08/09/2018 DISCHARGE DATE:    ADMISSION DIAGNOSIS: right shoulder osteoarthritis Past Medical History:  Diagnosis Date  . Allergy   . Arthritis    knees, lower back  . CKD (chronic kidney disease) stage 2, GFR 60-89 ml/min 07/11/2017  . Degenerative disc disease, lumbar   . Hypertension   . Osteopenia 06/22/2017   June 2016 DEXA  . PONV (postoperative nausea and vomiting)    in past.  none recently    DISCHARGE DIAGNOSIS:   Active Problems:   Status post total shoulder arthroplasty   PROCEDURE: Procedure(s): RIGHT TOTAL SHOULDER ARTHROPLASTY on 08/09/2018  CONSULTS:    HISTORY:  See H&P in chart.  HOSPITAL COURSE:  Rachel James is a 72 y.o. admitted on 08/09/2018 with a diagnosis of right shoulder osteoarthritis.  They were brought to the operating room on 08/09/2018 and underwent Procedure(s): RIGHT TOTAL SHOULDER ARTHROPLASTY.    They were given perioperative antibiotics:  Anti-infectives (From admission, onward)   Start     Dose/Rate Route Frequency Ordered Stop   08/09/18 1145  ceFAZolin (ANCEF) IVPB 2g/100 mL premix     2 g 200 mL/hr over 30 Minutes Intravenous On call to O.R. 08/09/18 1132 08/09/18 1245    .  Patient underwent the above named procedure and tolerated it well. The following day they were hemodynamically stable and pain was controlled on oral analgesics. They were neurovascularly intact to the operative extremity. OT was ordered and worked with patient per protocol. They were medically and orthopaedically stable for discharge on day 1 .    DIAGNOSTIC STUDIES:  RECENT RADIOGRAPHIC STUDIES :  Mm 3d Screen Breast Bilateral  Result Date: 08/07/2018 CLINICAL DATA:  Screening. EXAM: DIGITAL SCREENING BILATERAL MAMMOGRAM WITH TOMO AND CAD COMPARISON:  Previous exam(s). ACR Breast Density Category b: There are scattered areas of  fibroglandular density. FINDINGS: There are no findings suspicious for malignancy. Images were processed with CAD. IMPRESSION: No mammographic evidence of malignancy. A result letter of this screening mammogram will be mailed directly to the patient. RECOMMENDATION: Screening mammogram in one year. (Code:SM-B-01Y) BI-RADS CATEGORY  1: Negative. Electronically Signed   By: Marin Olp M.D.   On: 08/07/2018 17:11    RECENT VITAL SIGNS:   Patient Vitals for the past 24 hrs:  BP Temp Temp src Pulse Resp SpO2 Height Weight  08/10/18 0507 133/80 98.1 F (36.7 C) Oral 69 16 97 % - -  08/10/18 0053 120/73 (!) 97.5 F (36.4 C) Oral 70 16 97 % - -  08/10/18 0042 125/72 97.9 F (36.6 C) Oral 90 16 - 5\' 3"  (1.6 m) 62.6 kg  08/09/18 2105 125/72 97.9 F (36.6 C) Oral 90 16 94 % - -  08/09/18 1746 (!) 152/98 (!) 97.5 F (36.4 C) Oral 83 - 98 % - -  08/09/18 1715 - (!) 97.2 F (36.2 C) - 71 15 100 % - -  08/09/18 1712 (!) 152/82 - - 77 14 100 % - -  08/09/18 1700 - - - 71 14 100 % - -  08/09/18 1657 (!) 151/81 - - 67 13 100 % - -  08/09/18 1645 - - - 62 13 100 % - -  08/09/18 1642 (!) 146/82 - - (!) 59 10 100 % - -  08/09/18  1630 - - - (!) 58 (!) 8 100 % - -  08/09/18 1627 (!) 154/77 - - 62 13 100 % - -  08/09/18 1615 - - - 66 13 100 % - -  08/09/18 1612 (!) 151/79 - - 62 12 100 % - -  08/09/18 1600 - - - 63 14 100 % - -  08/09/18 1557 (!) 158/83 - - 67 13 100 % - -  08/09/18 1545 (!) 165/90 - - (!) 52 12 100 % - -  08/09/18 1542 (!) 169/104 - - (!) 53 11 100 % - -  08/09/18 1537 (!) 177/93 - - - - - - -  08/09/18 1530 - - - (!) 53 11 100 % - -  08/09/18 1527 (!) 177/93 - - (!) 54 10 100 % - -  08/09/18 1515 - - - 60 15 100 % - -  08/09/18 1512 (!) 167/97 - - 66 16 100 % - -  08/09/18 1500 - - - 71 16 100 % - -  08/09/18 1456 (!) 175/107 - - 71 15 100 % - -  08/09/18 1454 (!) 169/99 - - 68 15 100 % - -  08/09/18 1445 - (!) 97.3 F (36.3 C) - 69 12 100 % - -  08/09/18 1442 (!) 178/106 - -  (!) 148 16 100 % - -  08/09/18 1215 (!) 172/97 - - 72 17 92 % - -  08/09/18 1210 (!) 180/96 - - 64 18 99 % - -  08/09/18 1055 (!) 155/86 97.7 F (36.5 C) Oral 63 18 97 % - -  .  RECENT EKG RESULTS:    Orders placed or performed during the hospital encounter of 08/03/18  . EKG 12 lead  . EKG 12 lead    DISCHARGE INSTRUCTIONS:    DISCHARGE MEDICATIONS:   Allergies as of 08/10/2018      Reactions   Codeine Itching, Other (See Comments)   nightmare   Oxycodone Itching      Medication List    TAKE these medications   acetaminophen 500 MG tablet Commonly known as:  TYLENOL Take 1,000 mg by mouth 2 (two) times daily.   aspirin 81 MG tablet Take 81 mg by mouth every Monday, Wednesday, and Friday.   cholecalciferol 1000 units tablet Commonly known as:  VITAMIN D Take 1 tablet (1,000 Units total) by mouth daily.   docusate sodium 100 MG capsule Commonly known as:  COLACE Take 100 mg by mouth daily.   HYDROcodone-acetaminophen 5-325 MG tablet Commonly known as:  NORCO/VICODIN Take 1 tablet by mouth every 6 (six) hours as needed for moderate pain. What changed:  how much to take   losartan 50 MG tablet Commonly known as:  COZAAR Take 1 tablet (50 mg total) by mouth daily. What changed:  when to take this   metoprolol succinate 25 MG 24 hr tablet Commonly known as:  TOPROL-XL Take two pills in the morning and one pill at night What changed:    how much to take  how to take this  when to take this  additional instructions   MULTIVITAMIN PO Take 1 tablet by mouth daily.   naproxen sodium 220 MG tablet Commonly known as:  ALEVE Take 440 mg by mouth 2 (two) times daily.   simvastatin 10 MG tablet Commonly known as:  ZOCOR Take 1 tablet (10 mg total) by mouth daily.   tiZANidine 4 MG tablet Commonly known as:  ZANAFLEX Take  1 tablet (4 mg total) by mouth every 8 (eight) hours as needed for muscle spasms.       FOLLOW UP VISIT:   Follow-up Information     Rachel Britain, MD.   Specialty:  Orthopedic Surgery Why:  call to be seen in 10-14 days Contact information: [address]  [place] [address] [phone]           DISCHARGE MC:NOBS   DISCHARGE CONDITION:  Rachel James Rachel James for Dr. Justice James 08/10/2018, 9:02 AM

## 2018-08-10 NOTE — Plan of Care (Signed)

## 2018-08-10 NOTE — Evaluation (Signed)
Occupational Therapy Evaluation and Discharge Patient Details Name: Rachel James MRN: [id number] DOB: Jun 12, 1946 Today's Date: 08/10/2018    History of Present Illness s/p R total shoulder arthroplasty   Clinical Impression   Pt educated in compensatory strategies for ADL, sling use, positioning R UE in bed and chair, exercises as per MD order. Pt verbalized and/or demonstrated understanding of all information. Reinforced education with written handouts. Pt will have assist of family and a neighbor as needed at home.     Follow Up Recommendations  Follow surgeon's recommendation for DC plan and follow-up therapies    Equipment Recommendations  None recommended by OT    Recommendations for Other Services       Precautions / Restrictions Precautions Precautions: Shoulder Type of Shoulder Precautions: may use R UE for feeding/hygiene within ER 20, ABD 45 and FF 60 degrees limitations, pendulums, AROM elbow to hand  Shoulder Interventions: Shoulder sling/immobilizer;Off for dressing/bathing/exercises(can come out in controlled environment) Precaution Booklet Issued: Yes (comment) Required Braces or Orthoses: Sling Restrictions Weight Bearing Restrictions: Yes RUE Weight Bearing: Non weight bearing      Mobility Bed Mobility               General bed mobility comments: pt walking about her room, educated to avoid weight bearing on R UE during bed mobility  Transfers Overall transfer level: Independent Equipment used: None                  Balance                                           ADL either performed or assessed with clinical judgement   ADL Overall ADL's : Modified independent                                       General ADL Comments: instructed in compensatory strategies for ADL, donning and doffing sling     Vision Baseline Vision/History: Wears glasses Wears Glasses: At all times Patient Visual Report:  No change from baseline       Perception     Praxis      Pertinent Vitals/Pain Pain Assessment: No/denies pain     Hand Dominance Right   Extremity/Trunk Assessment Upper Extremity Assessment Upper Extremity Assessment: RUE deficits/detail RUE Deficits / Details: nerve block intact, performed pendulums and A/AROM elbow to hand, instructed pt to continue exercises x 3/day RUE Coordination: decreased gross motor;decreased fine motor   Lower Extremity Assessment Lower Extremity Assessment: Overall WFL for tasks assessed   Cervical / Trunk Assessment Cervical / Trunk Assessment: Normal   Communication Communication Communication: No difficulties   Cognition Arousal/Alertness: Awake/alert Behavior During Therapy: WFL for tasks assessed/performed Overall Cognitive Status: Within Functional Limits for tasks assessed                                     General Comments       Exercises     Shoulder Instructions      Home Living Family/patient expects to be discharged to:: Private residence Living Arrangements: Alone Available Help at Discharge: Friend(s);Family;Available PRN/intermittently Type of Home: House  Home Equipment: None          Prior Functioning/Environment Level of Independence: Independent                 OT Problem List:        OT Treatment/Interventions:      OT Goals(Current goals can be found in the care plan section) Acute Rehab OT Goals Patient Stated Goal: to regain pain free use of R shoulder  OT Frequency:     Barriers to D/C:            Co-evaluation              AM-PAC PT "6 Clicks" Daily Activity     Outcome Measure Help from another person eating meals?: None Help from another person taking care of personal grooming?: None Help from another person toileting, which includes using toliet, bedpan, or urinal?: None Help from another person bathing (including washing,  rinsing, drying)?: None Help from another person to put on and taking off regular upper body clothing?: None Help from another person to put on and taking off regular lower body clothing?: None 6 Click Score: 24   End of Session Nurse Communication: (ready for d/c)  Activity Tolerance: Patient tolerated treatment well Patient left: in chair;with call bell/phone within reach  OT Visit Diagnosis: Pain                Time: 6294-7654 OT Time Calculation (min): 27 min Charges:  OT General Charges $OT Visit: 1 Visit OT Evaluation $OT Eval Low Complexity: 1 Low OT Treatments $Therapeutic Activity: 8-22 mins  08/10/2018 Nestor Lewandowsky, OTR/L Pager: [phone]  [name] 08/10/2018, 12:18 PM

## 2018-08-10 NOTE — Plan of Care (Signed)

## 2018-08-24 DIAGNOSIS — M19011 Primary osteoarthritis, right shoulder: Secondary | ICD-10-CM | POA: Diagnosis not present

## 2018-08-26 ENCOUNTER — Encounter: Payer: Self-pay | Admitting: Family Medicine

## 2018-08-26 DIAGNOSIS — E785 Hyperlipidemia, unspecified: Secondary | ICD-10-CM

## 2018-08-28 DIAGNOSIS — M6281 Muscle weakness (generalized): Secondary | ICD-10-CM | POA: Diagnosis not present

## 2018-08-28 DIAGNOSIS — M25611 Stiffness of right shoulder, not elsewhere classified: Secondary | ICD-10-CM | POA: Diagnosis not present

## 2018-08-28 DIAGNOSIS — M25511 Pain in right shoulder: Secondary | ICD-10-CM | POA: Diagnosis not present

## 2018-08-28 DIAGNOSIS — Z96611 Presence of right artificial shoulder joint: Secondary | ICD-10-CM | POA: Diagnosis not present

## 2018-08-28 MED ORDER — SIMVASTATIN 10 MG PO TABS: 10.0000 mg | ORAL_TABLET | Freq: Every day | ORAL | 1 refills | Status: DC

## 2018-09-06 ENCOUNTER — Encounter: Payer: Self-pay | Admitting: Family Medicine

## 2018-09-06 DIAGNOSIS — Z96611 Presence of right artificial shoulder joint: Secondary | ICD-10-CM | POA: Diagnosis not present

## 2018-09-06 DIAGNOSIS — M6281 Muscle weakness (generalized): Secondary | ICD-10-CM | POA: Diagnosis not present

## 2018-09-06 DIAGNOSIS — M25611 Stiffness of right shoulder, not elsewhere classified: Secondary | ICD-10-CM | POA: Diagnosis not present

## 2018-09-06 DIAGNOSIS — M25511 Pain in right shoulder: Secondary | ICD-10-CM | POA: Diagnosis not present

## 2018-09-13 ENCOUNTER — Encounter: Payer: Self-pay | Admitting: Family Medicine

## 2018-09-13 MED ORDER — TIZANIDINE HCL 4 MG PO TABS: 4.0000 mg | ORAL_TABLET | Freq: Three times a day (TID) | ORAL | 0 refills | Status: DC | PRN

## 2018-09-18 DIAGNOSIS — M25611 Stiffness of right shoulder, not elsewhere classified: Secondary | ICD-10-CM | POA: Diagnosis not present

## 2018-09-18 DIAGNOSIS — M6281 Muscle weakness (generalized): Secondary | ICD-10-CM | POA: Diagnosis not present

## 2018-09-18 DIAGNOSIS — M25511 Pain in right shoulder: Secondary | ICD-10-CM | POA: Diagnosis not present

## 2018-09-18 DIAGNOSIS — Z96611 Presence of right artificial shoulder joint: Secondary | ICD-10-CM | POA: Diagnosis not present

## 2018-09-20 DIAGNOSIS — M25511 Pain in right shoulder: Secondary | ICD-10-CM | POA: Diagnosis not present

## 2018-09-20 DIAGNOSIS — M6281 Muscle weakness (generalized): Secondary | ICD-10-CM | POA: Diagnosis not present

## 2018-09-20 DIAGNOSIS — M25611 Stiffness of right shoulder, not elsewhere classified: Secondary | ICD-10-CM | POA: Diagnosis not present

## 2018-09-20 DIAGNOSIS — Z96611 Presence of right artificial shoulder joint: Secondary | ICD-10-CM | POA: Diagnosis not present

## 2018-09-24 DIAGNOSIS — M25611 Stiffness of right shoulder, not elsewhere classified: Secondary | ICD-10-CM | POA: Diagnosis not present

## 2018-09-24 DIAGNOSIS — Z96611 Presence of right artificial shoulder joint: Secondary | ICD-10-CM | POA: Diagnosis not present

## 2018-09-24 DIAGNOSIS — M6281 Muscle weakness (generalized): Secondary | ICD-10-CM | POA: Diagnosis not present

## 2018-09-24 DIAGNOSIS — M25511 Pain in right shoulder: Secondary | ICD-10-CM | POA: Diagnosis not present

## 2018-09-27 ENCOUNTER — Ambulatory Visit (INDEPENDENT_AMBULATORY_CARE_PROVIDER_SITE_OTHER): Payer: PPO | Admitting: Family Medicine

## 2018-09-27 ENCOUNTER — Ambulatory Visit (INDEPENDENT_AMBULATORY_CARE_PROVIDER_SITE_OTHER): Payer: PPO

## 2018-09-27 VITALS — BP 132/84 | HR 66 | Temp 97.8°F | Resp 12 | Ht 63.0 in | Wt 135.4 lb

## 2018-09-27 VITALS — BP 132/84 | HR 66 | Temp 97.8°F | Ht 63.0 in | Wt 135.4 lb

## 2018-09-27 DIAGNOSIS — M1711 Unilateral primary osteoarthritis, right knee: Secondary | ICD-10-CM

## 2018-09-27 DIAGNOSIS — Z598 Other problems related to housing and economic circumstances: Secondary | ICD-10-CM | POA: Diagnosis not present

## 2018-09-27 DIAGNOSIS — E785 Hyperlipidemia, unspecified: Secondary | ICD-10-CM

## 2018-09-27 DIAGNOSIS — Z23 Encounter for immunization: Secondary | ICD-10-CM

## 2018-09-27 DIAGNOSIS — I10 Essential (primary) hypertension: Secondary | ICD-10-CM

## 2018-09-27 DIAGNOSIS — Z0000 Encounter for general adult medical examination without abnormal findings: Secondary | ICD-10-CM | POA: Diagnosis not present

## 2018-09-27 DIAGNOSIS — N182 Chronic kidney disease, stage 2 (mild): Secondary | ICD-10-CM

## 2018-09-27 DIAGNOSIS — Z5181 Encounter for therapeutic drug level monitoring: Secondary | ICD-10-CM | POA: Diagnosis not present

## 2018-09-27 DIAGNOSIS — H9190 Unspecified hearing loss, unspecified ear: Secondary | ICD-10-CM | POA: Diagnosis not present

## 2018-09-27 DIAGNOSIS — G8929 Other chronic pain: Secondary | ICD-10-CM | POA: Diagnosis not present

## 2018-09-27 DIAGNOSIS — M6281 Muscle weakness (generalized): Secondary | ICD-10-CM | POA: Diagnosis not present

## 2018-09-27 DIAGNOSIS — Z599 Problem related to housing and economic circumstances, unspecified: Secondary | ICD-10-CM

## 2018-09-27 DIAGNOSIS — Z96611 Presence of right artificial shoulder joint: Secondary | ICD-10-CM | POA: Diagnosis not present

## 2018-09-27 DIAGNOSIS — M25611 Stiffness of right shoulder, not elsewhere classified: Secondary | ICD-10-CM | POA: Diagnosis not present

## 2018-09-27 DIAGNOSIS — M25511 Pain in right shoulder: Secondary | ICD-10-CM | POA: Diagnosis not present

## 2018-09-27 LAB — COMPLETE METABOLIC PANEL WITH GFR
AG Ratio: 1.9 (calc) (ref 1.0–2.5)
ALT: 12 U/L (ref 6–29)
AST: 23 U/L (ref 10–35)
Albumin: 4.3 g/dL (ref 3.6–5.1)
Alkaline phosphatase (APISO): 42 U/L (ref 33–130)
BUN: 14 mg/dL (ref 7–25)
CALCIUM: 9.8 mg/dL (ref 8.6–10.4)
CO2: 28 mmol/L (ref 20–32)
CREATININE: 0.74 mg/dL (ref 0.60–0.93)
Chloride: 102 mmol/L (ref 98–110)
GFR, EST NON AFRICAN AMERICAN: 82 mL/min/{1.73_m2} (ref >=60)
GFR, Est African American: 95 mL/min/{1.73_m2} (ref >=60)
GLOBULIN: 2.3 g/dL (ref 1.9–3.7)
Glucose, Bld: 79 mg/dL (ref 65–99)
Potassium: 4.1 mmol/L (ref 3.5–5.3)
SODIUM: 138 mmol/L (ref 135–146)
Total Bilirubin: 0.8 mg/dL (ref 0.2–1.2)
Total Protein: 6.6 g/dL (ref 6.1–8.1)

## 2018-09-27 LAB — LIPID PANEL
CHOL/HDL RATIO: 2.1 (calc) (ref <5.0)
Cholesterol: 176 mg/dL (ref <200)
HDL: 82 mg/dL (ref >50)
LDL Cholesterol (Calc): 78 mg/dL (calc)
NON-HDL CHOLESTEROL (CALC): 94 mg/dL (ref <130)
TRIGLYCERIDES: 76 mg/dL (ref <150)

## 2018-09-27 MED ORDER — METOPROLOL SUCCINATE ER 50 MG PO TB24: 50.0000 mg | ORAL_TABLET | Freq: Two times a day (BID) | ORAL | 2 refills | Status: DC

## 2018-09-27 NOTE — Assessment & Plan Note (Signed)
Let's check Cr to see what her aleve is doing; tylenol is best for pain that won't hurt her kidneys

## 2018-09-27 NOTE — Assessment & Plan Note (Signed)
Limit saturated fats, especially fatty meats; be aware of fat in cheese; continue statin; check lipids

## 2018-09-27 NOTE — Assessment & Plan Note (Signed)
Check liver and kidneys 

## 2018-09-27 NOTE — Patient Instructions (Signed)
[name] , Thank you for taking time to come for your Medicare Wellness Visit. I appreciate your ongoing commitment to your health goals. Please review the following plan we discussed and let me know if I can assist you in the future.   Screening recommendations/referrals: Colorectal Screening: Up to date Mammogram: Up to date Bone Density: Up to date  Vision and Dental Exams: Recommended annual ophthalmology exams for early detection of glaucoma and other disorders of the eye Recommended annual dental exams for proper oral hygiene  Vaccinations: Influenza vaccine: Completed today Pneumococcal vaccine: Up to date Tdap vaccine: Up to date Shingles vaccine: Please call your insurance company to determine your out of pocket expense for the Shingrix vaccine. You may receive this vaccine at your local pharmacy.  Advanced directives: Please bring a copy of your POA (Power of Attorney) and/or Living Will to your next appointment.  Goals: Recommend to eat 3 small healthy meals and at least 2 healthy snacks per day.  Next appointment: Please schedule your Annual Wellness Visit with your Nurse Health Advisor in one year.  Preventive Care 70 Years and Older, Female Preventive care refers to lifestyle choices and visits with your health care provider that can promote health and wellness. What does preventive care include?  A yearly physical exam. This is also called an annual well check.  Dental exams once or twice a year.  Routine eye exams. Ask your health care provider how often you should have your eyes checked.  Personal lifestyle choices, including:  Daily care of your teeth and gums.  Regular physical activity.  Eating a healthy diet.  Avoiding tobacco and drug use.  Limiting alcohol use.  Practicing safe sex.  Taking low-dose aspirin every day if recommended by your health care provider.  Taking vitamin and mineral supplements as recommended by your health care  provider. What happens during an annual well check? The services and screenings done by your health care provider during your annual well check will depend on your age, overall health, lifestyle risk factors, and family history of disease. Counseling  Your health care provider may ask you questions about your:  Alcohol use.  Tobacco use.  Drug use.  Emotional well-being.  Home and relationship well-being.  Sexual activity.  Eating habits.  History of falls.  Memory and ability to understand (cognition).  Work and work Statistician.  Reproductive health. Screening  You may have the following tests or measurements:  Height, weight, and BMI.  Blood pressure.  Lipid and cholesterol levels. These may be checked every 5 years, or more frequently if you are over 39 years old.  Skin check.  Lung cancer screening. You may have this screening every year starting at age 70 if you have a 30-pack-year history of smoking and currently smoke or have quit within the past 15 years.  Fecal occult blood test (FOBT) of the stool. You may have this test every year starting at age 45.  Flexible sigmoidoscopy or colonoscopy. You may have a sigmoidoscopy every 5 years or a colonoscopy every 10 years starting at age 95.  Hepatitis C blood test.  Hepatitis B blood test.  Sexually transmitted disease (STD) testing.  Diabetes screening. This is done by checking your blood sugar (glucose) after you have not eaten for a while (fasting). You may have this done every 1-3 years.  Bone density scan. This is done to screen for osteoporosis. You may have this done starting at age 70.  Mammogram. This may be  done every 1-2 years. Talk to your health care provider about how often you should have regular mammograms. Talk with your health care provider about your test results, treatment options, and if necessary, the need for more tests. Vaccines  Your health care provider may recommend certain  vaccines, such as:  Influenza vaccine. This is recommended every year.  Tetanus, diphtheria, and acellular pertussis (Tdap, Td) vaccine. You may need a Td booster every 10 years.  Zoster vaccine. You may need this after age 42.  Pneumococcal 13-valent conjugate (PCV13) vaccine. One dose is recommended after age 70.  Pneumococcal polysaccharide (PPSV23) vaccine. One dose is recommended after age 70. Talk to your health care provider about which screenings and vaccines you need and how often you need them. This information is not intended to replace advice given to you by your health care provider. Make sure you discuss any questions you have with your health care provider. Document Released: 01/01/2016 Document Revised: 08/24/2016 Document Reviewed: 10/06/2015 Elsevier Interactive Patient Education  2017 [place] Prevention in the Home Falls can cause injuries. They can happen to people of all ages. There are many things you can do to make your home safe and to help prevent falls. What can I do on the outside of my home?  Regularly fix the edges of walkways and driveways and fix any cracks.  Remove anything that might make you trip as you walk through a door, such as a raised step or threshold.  Trim any bushes or trees on the path to your home.  Use bright outdoor lighting.  Clear any walking paths of anything that might make someone trip, such as rocks or tools.  Regularly check to see if handrails are loose or broken. Make sure that both sides of any steps have handrails.  Any raised decks and porches should have guardrails on the edges.  Have any leaves, snow, or ice cleared regularly.  Use sand or salt on walking paths during winter.  Clean up any spills in your garage right away. This includes oil or grease spills. What can I do in the bathroom?  Use night lights.  Install grab bars by the toilet and in the tub and shower. Do not use towel bars as grab  bars.  Use non-skid mats or decals in the tub or shower.  If you need to sit down in the shower, use a plastic, non-slip stool.  Keep the floor dry. Clean up any water that spills on the floor as soon as it happens.  Remove soap buildup in the tub or shower regularly.  Attach bath mats securely with double-sided non-slip rug tape.  Do not have throw rugs and other things on the floor that can make you trip. What can I do in the bedroom?  Use night lights.  Make sure that you have a light by your bed that is easy to reach.  Do not use any sheets or blankets that are too big for your bed. They should not hang down onto the floor.  Have a firm chair that has side arms. You can use this for support while you get dressed.  Do not have throw rugs and other things on the floor that can make you trip. What can I do in the kitchen?  Clean up any spills right away.  Avoid walking on wet floors.  Keep items that you use a lot in easy-to-reach places.  If you need to reach something above you, use  a strong step stool that has a grab bar.  Keep electrical cords out of the way.  Do not use floor polish or wax that makes floors slippery. If you must use wax, use non-skid floor wax.  Do not have throw rugs and other things on the floor that can make you trip. What can I do with my stairs?  Do not leave any items on the stairs.  Make sure that there are handrails on both sides of the stairs and use them. Fix handrails that are broken or loose. Make sure that handrails are as long as the stairways.  Check any carpeting to make sure that it is firmly attached to the stairs. Fix any carpet that is loose or worn.  Avoid having throw rugs at the top or bottom of the stairs. If you do have throw rugs, attach them to the floor with carpet tape.  Make sure that you have a light switch at the top of the stairs and the bottom of the stairs. If you do not have them, ask someone to add them for  you. What else can I do to help prevent falls?  Wear shoes that:  Do not have high heels.  Have rubber bottoms.  Are comfortable and fit you well.  Are closed at the toe. Do not wear sandals.  If you use a stepladder:  Make sure that it is fully opened. Do not climb a closed stepladder.  Make sure that both sides of the stepladder are locked into place.  Ask someone to hold it for you, if possible.  Clearly mark and make sure that you can see:  Any grab bars or handrails.  First and last steps.  Where the edge of each step is.  Use tools that help you move around (mobility aids) if they are needed. These include:  Canes.  Walkers.  Scooters.  Crutches.  Turn on the lights when you go into a dark area. Replace any light bulbs as soon as they burn out.  Set up your furniture so you have a clear path. Avoid moving your furniture around.  If any of your floors are uneven, fix them.  If there are any pets around you, be aware of where they are.  Review your medicines with your doctor. Some medicines can make you feel dizzy. This can increase your chance of falling. Ask your doctor what other things that you can do to help prevent falls. This information is not intended to replace advice given to you by your health care provider. Make sure you discuss any questions you have with your health care provider. Document Released: 10/01/2009 Document Revised: 05/12/2016 Document Reviewed: 01/09/2015 Elsevier Interactive Patient Education  2017 [employer].

## 2018-09-27 NOTE — Assessment & Plan Note (Signed)
Try tylenol, topicals, turmeric

## 2018-09-27 NOTE — Progress Notes (Signed)
BP 132/84   Pulse 66   Temp 97.8 F (36.6 C) (Oral)   Ht 5\' 3"  (1.6 m)   Wt 135 lb 6.4 oz (61.4 kg)   SpO2 96%   BMI 23.99 kg/m    Subjective:    Patient ID: Rachel James, female    DOB: 09-10-1946, 72 y.o.   MRN: [id number]  HPI: Rachel James is a 72 y.o. female  Chief Complaint  Patient presents with  . Follow-up    HPI She is here for follow-up; saw Ammie LPN for her Medicare Wellness visit just before seeing me  She is going to see someone about her hearing; also going to do a referral for financial assistance with specialists and physical therapy Healthier eating was discussed  She had her right shoulder replacement; it is going well so far; a little over 6 weeks ago; Dr. Onnie Graham at [facility]; no fevers; no drainage or redness at the surgical sites; he is pleased with her progress; she is already feeling better  She is not real pleased with her BP pill combination; she brought in her cuff and some of her recent readings; four days ago, her BP was 166/96; cuff is only 3 months old; another day it was 148; foot and ankle swelling and dull headache; heart rate usually in the 70s; she doesn't want the diuretic, as that gave her bad cramps; she gets tonsil stones; saw ENT 3 years ago who wanted her to have her tonsils out, but she declined surgery  CKD, stage 2; taking 220 mg naproxen BID; tylenol best for pain I explained  Arthritis in the right knee; she has not talked to the ortho about that; xrays done about 4-5 years ago in [place]; physical therapist asked when she was going to get her knee replaced  High cholesterol; taking statin; trying to limit fatty meats; does eats cheese; more chicken and [place]; tries to eat lean  Lab Results  Component Value Date   CHOL 176 09/27/2018   HDL 82 09/27/2018   LDLCALC 78 09/27/2018   TRIG 76 09/27/2018   CHOLHDL 2.1 09/27/2018     Depression screen [facility] 2/9 09/27/2018 07/05/2018 03/28/2018 09/26/2017 07/11/2017    Decreased Interest 0 0 0 0 0  Down, Depressed, Hopeless 0 0 0 0 0  PHQ - 2 Score 0 0 0 0 0  Altered sleeping 0 - - - -  Tired, decreased energy 0 - - - -  Change in appetite 0 - - - -  Feeling bad or failure about yourself  0 - - - -  Trouble concentrating 0 - - - -  Moving slowly or fidgety/restless 0 - - - -  Suicidal thoughts 0 - - - -  PHQ-9 Score 0 - - - -  Difficult doing work/chores Not difficult at all - - - -   Fall Risk  09/27/2018 09/27/2018 07/05/2018 03/28/2018 09/26/2017  Falls in the past year? No No No No No  Number falls in past yr: - - - - -  Injury with Fall? - - - - -  Risk for fall due to : Impaired vision Impaired vision - - -  Risk for fall due to: Comment wears eyeglasses wears eyeglasses - - -    Relevant past medical, surgical, family and social history reviewed Past Medical History:  Diagnosis Date  . Allergy   . Arthritis    knees, lower back  . CKD (chronic kidney disease) stage  2, GFR 60-89 ml/min 07/11/2017  . Degenerative disc disease, lumbar   . Hypertension   . Osteopenia 06/22/2017   June 2016 DEXA  . PONV (postoperative nausea and vomiting)    in past.  none recently   Past Surgical History:  Procedure Laterality Date  .  THUMB SURGERY Left   . APPENDECTOMY    . BREAST BIOPSY Right 1992   neg  . BREAST LUMPECTOMY Right   . BUNIONECTOMY Bilateral   . CERVICAL DISCECTOMY  09/18/2012   C4-C5 AND C6-C7 ACDF   . SHOULDER ARTHROSCOPY WITH ROTATOR CUFF REPAIR AND OPEN BICEPS TENODESIS Right 12/20/2017   Procedure: SHOULDER ARTHROSCOPY WITH DEBRIDEMENT, DECOMPRESSION AND  BICEPS TENODESIS;  Surgeon: Corky Mull, MD;  Location: [place];  Service: Orthopedics;  Laterality: Right;  . TOTAL SHOULDER ARTHROPLASTY Right 08/09/2018   Procedure: RIGHT TOTAL SHOULDER ARTHROPLASTY;  Surgeon: Justice Britain, MD;  Location: [place];  Service: Orthopedics;  Laterality: Right;  . TUBAL LIGATION Bilateral    Family History  Problem Relation Age of  Onset  . Heart disease Mother   . Heart attack Mother   . Heart disease Father   . Heart disease Sister   . Heart disease Brother   . Heart failure Brother   . Memory loss Brother   . Diabetes Paternal Grandmother   . Pulmonary embolism Sister   . Breast cancer Neg Hx    Social History   Tobacco Use  . Smoking status: Never Smoker  . Smokeless tobacco: Never Used  . Tobacco comment: smoking cessation materials not required  Substance Use Topics  . Alcohol use: Not Currently  . Drug use: No     Office Visit from 07/05/2018 in [facility]  AUDIT-C Score  2      Interim medical history since last visit reviewed. Allergies and medications reviewed  Review of Systems Per HPI unless specifically indicated above     Objective:    BP 132/84   Pulse 66   Temp 97.8 F (36.6 C) (Oral)   Ht 5\' 3"  (1.6 m)   Wt 135 lb 6.4 oz (61.4 kg)   SpO2 96%   BMI 23.99 kg/m   Wt Readings from Last 3 Encounters:  09/27/18 135 lb 6.4 oz (61.4 kg)  09/27/18 135 lb 6.4 oz (61.4 kg)  08/10/18 137 lb 14.4 oz (62.6 kg)    Physical Exam  Constitutional: She appears well-developed and well-nourished. No distress.  HENT:  Head: Normocephalic and atraumatic.  Eyes: EOM are normal. No scleral icterus.  Neck: No thyromegaly present.  Cardiovascular: Normal rate, regular rhythm and normal heart sounds.  No murmur heard. Pulmonary/Chest: Effort normal and breath sounds normal. No respiratory distress. She has no wheezes.  Abdominal: Soft. Bowel sounds are normal. She exhibits no distension.  Musculoskeletal: She exhibits no edema.  Neurological: She is alert.  Skin: Skin is warm and dry. She is not diaphoretic. No pallor.  Psychiatric: She has a normal mood and affect. Her behavior is normal. Judgment and thought content normal.      Assessment & Plan:   Problem List Items Addressed This Visit      Cardiovascular and Mediastinum   Hypertension goal BP (blood pressure)  < 140/90 - Primary (Chronic)    Will increase the beta-blocker and continue the ARB; return for BP check in 10-12 days      Relevant Medications   metoprolol succinate (TOPROL-XL) 50 MG 24 hr tablet  Musculoskeletal and Integument   Arthritis of right knee    Try tylenol, topicals, turmeric        Genitourinary   CKD (chronic kidney disease) stage 2, GFR 60-89 ml/min    Let's check Cr to see what her aleve is doing; tylenol is best for pain that won't hurt her kidneys        Other   Medication monitoring encounter    Check liver and kidneys      Relevant Orders   COMPLETE METABOLIC PANEL WITH GFR (Completed)   Hyperlipidemia LDL goal <100 (Chronic)    Limit saturated fats, especially fatty meats; be aware of fat in cheese; continue statin; check lipids      Relevant Medications   metoprolol succinate (TOPROL-XL) 50 MG 24 hr tablet   Other Relevant Orders   Lipid panel (Completed)   Chronic right shoulder pain    Improving after surgery          Follow up plan: Return in about 1 week (around 10/04/2018) for blood pressure and pulse recheck with CMA; 6 months with Dr. Sanda Klein.  An after-visit summary was printed and given to the patient at [place].  Please see the patient instructions which may contain other information and recommendations beyond what is mentioned above in the assessment and plan.  Meds ordered this encounter  Medications  . metoprolol succinate (TOPROL-XL) 50 MG 24 hr tablet    Sig: Take 1 tablet (50 mg total) by mouth 2 (two) times daily. Take with or immediately following a meal.    Dispense:  60 tablet    Refill:  2    We're changing up the dose and instructions    Orders Placed This Encounter  Procedures  . COMPLETE METABOLIC PANEL WITH GFR  . Lipid panel

## 2018-09-27 NOTE — Assessment & Plan Note (Signed)
Will increase the beta-blocker and continue the ARB; return for BP check in 10-12 days

## 2018-09-27 NOTE — Assessment & Plan Note (Signed)
Improving after surgery

## 2018-09-27 NOTE — Progress Notes (Signed)
Subjective:   Rachel James is a 72 y.o. female who presents for Medicare Annual (Subsequent) preventive examination.  Review of Systems:  N/A Cardiac Risk Factors include: advanced age (>27men, >31 women);dyslipidemia;hypertension;sedentary lifestyle     Objective:     Vitals: BP 132/84 (BP Location: Left Arm, Patient Position: Sitting, Cuff Size: Normal)   Pulse 66   Temp 97.8 F (36.6 C) (Oral)   Resp 12   Ht 5\' 3"  (1.6 m)   Wt 135 lb 6.4 oz (61.4 kg)   SpO2 96%   BMI 23.99 kg/m   Body mass index is 23.99 kg/m.  Advanced Directives 09/27/2018 08/10/2018 08/10/2018 08/03/2018 12/20/2017 10/02/2017 09/26/2017  Does Patient Have a Medical Advance Directive? No;Yes - No No No No No  Type of Academic librarian;Living will - - - - - -  Does patient want to make changes to medical advance directive? - - - - - - -  Copy of [place] in Chart? No - copy requested - - - - - -  Would patient like information on creating a medical advance directive? - Yes (MAU/Ambulatory/Procedural Areas - Information given) - Yes (MAU/Ambulatory/Procedural Areas - Information given) Yes (MAU/Ambulatory/Procedural Areas - Information given) - -    Tobacco Social History   Tobacco Use  Smoking Status Never Smoker  Smokeless Tobacco Never Used  Tobacco Comment   smoking cessation materials not required     Counseling given: No Comment: smoking cessation materials not required  Clinical Intake:  Pre-visit preparation completed: Yes  Pain : No/denies pain   BMI - recorded: 23.99 Nutritional Status: BMI of 19-24  Normal Nutritional Risks: None Diabetes: No  How often do you need to have someone help you when you read instructions, pamphlets, or other written materials from your doctor or pharmacy?: 1 - Never  Interpreter Needed?: No  Information entered by :: AEversole, LPN  Past Medical History:  Diagnosis Date  . Allergy   . Arthritis      knees, lower back  . CKD (chronic kidney disease) stage 2, GFR 60-89 ml/min 07/11/2017  . Degenerative disc disease, lumbar   . Hypertension   . Osteopenia 06/22/2017   June 2016 DEXA  . PONV (postoperative nausea and vomiting)    in past.  none recently   Past Surgical History:  Procedure Laterality Date  .  THUMB SURGERY Left   . APPENDECTOMY    . BREAST BIOPSY Right 1992   neg  . BREAST LUMPECTOMY Right   . BUNIONECTOMY Bilateral   . CERVICAL DISCECTOMY  09/18/2012   C4-C5 AND C6-C7 ACDF   . SHOULDER ARTHROSCOPY WITH ROTATOR CUFF REPAIR AND OPEN BICEPS TENODESIS Right 12/20/2017   Procedure: SHOULDER ARTHROSCOPY WITH DEBRIDEMENT, DECOMPRESSION AND  BICEPS TENODESIS;  Surgeon: Corky Mull, MD;  Location: [facility];  Service: Orthopedics;  Laterality: Right;  . TOTAL SHOULDER ARTHROPLASTY Right 08/09/2018   Procedure: RIGHT TOTAL SHOULDER ARTHROPLASTY;  Surgeon: Justice Britain, MD;  Location: [facility];  Service: Orthopedics;  Laterality: Right;  . TUBAL LIGATION Bilateral    Family History  Problem Relation Age of Onset  . Heart disease Mother   . Heart attack Mother   . Heart disease Father   . Heart disease Sister   . Heart disease Brother   . Heart failure Brother   . Memory loss Brother   . Diabetes Paternal Grandmother   . Pulmonary embolism Sister   . Breast cancer  Neg Hx    Social History   Socioeconomic History  . Marital status: Divorced    Spouse name: Not on file  . Number of children: 3  . Years of education: Not on file  . Highest education level: Associate degree: occupational, Hotel manager, or vocational program  Occupational History  . Occupation: retired  Scientific laboratory technician  . Financial resource strain: Not hard at all  . Food insecurity:    Worry: Never true    Inability: Never true  . Transportation needs:    Medical: No    Non-medical: No  Tobacco Use  . Smoking status: Never Smoker  . Smokeless tobacco: Never Used  . Tobacco comment: smoking  cessation materials not required  Substance and Sexual Activity  . Alcohol use: Not Currently  . Drug use: No  . Sexual activity: Not Currently  Lifestyle  . Physical activity:    Days per week: 0 days    Minutes per session: 0 min  . Stress: Not at all  Relationships  . Social connections:    Talks on phone: Patient refused    Gets together: Patient refused    Attends religious service: Patient refused    Active member of club or organization: Patient refused    Attends meetings of clubs or organizations: Patient refused    Relationship status: Divorced  Other Topics Concern  . Not on file  Social History Narrative  . Not on file    Outpatient Encounter Medications as of 09/27/2018  Medication Sig  . acetaminophen (TYLENOL) 500 MG tablet Take 1,000 mg by mouth 2 (two) times daily.  Marland Kitchen aspirin 81 MG tablet Take 81 mg by mouth daily.   . cholecalciferol (VITAMIN D) 1000 units tablet Take 1 tablet (1,000 Units total) by mouth daily.  Marland Kitchen docusate sodium (COLACE) 100 MG capsule Take 100 mg by mouth daily.  Marland Kitchen losartan (COZAAR) 50 MG tablet Take 1 tablet (50 mg total) by mouth daily. (Patient taking differently: Take 50 mg by mouth every evening. )  . metoprolol succinate (TOPROL-XL) 25 MG 24 hr tablet Take two pills in the morning and one pill at night (Patient taking differently: Take 25-50 mg by mouth See admin instructions. Take 50 mg by mouth in the morning and take 25 mg by mouth at bedtime)  . Multiple Vitamins-Minerals (MULTIVITAMIN PO) Take 1 tablet by mouth daily.  . naproxen sodium (ANAPROX) 220 MG tablet Take 440 mg by mouth 2 (two) times daily.   . simvastatin (ZOCOR) 10 MG tablet Take 1 tablet (10 mg total) by mouth daily.  Marland Kitchen tiZANidine (ZANAFLEX) 4 MG tablet Take 1 tablet (4 mg total) by mouth every 8 (eight) hours as needed for muscle spasms.  Marland Kitchen HYDROcodone-acetaminophen (NORCO/VICODIN) 5-325 MG tablet Take 1 tablet by mouth every 6 (six) hours as needed for moderate pain.  (Patient not taking: Reported on 09/27/2018)   No facility-administered encounter medications on file as of 09/27/2018.     Activities of Daily Living In your present state of health, do you have any difficulty performing the following activities: 09/27/2018 08/10/2018  Hearing? N Y  Comment denies hearing aids; changes in hearing; hearing exam approx 2 years ago; Referral placed for pt to be re-evaluated -  Vision? N N  Comment wears eyeglasses -  Difficulty concentrating or making decisions? N N  Walking or climbing stairs? Y N  Comment R knee pain; back pain; avoids stairs -  Dressing or bathing? N N  Doing errands, shopping?  N Y  Conservation officer, nature and eating ? N -  Comment denies dentures -  Using the Toilet? N -  In the past six months, have you accidently leaked urine? Y -  Comment urgency, stress incontinence -  Do you have problems with loss of bowel control? N -  Managing your Medications? N -  Managing your Finances? N -  Housekeeping or managing your Housekeeping? N -  Some recent data might be hidden    Patient Care Team: Lada, Satira Anis, MD as PCP - General (Family Medicine) Beverly Gust, MD as Consulting Physician (Otolaryngology) Justice Britain, MD as Consulting Physician (Orthopedic Surgery)    Assessment:   This is a routine wellness examination for [place].  Exercise Activities and Dietary recommendations Current Exercise Habits: The patient does not participate in regular exercise at present, Exercise limited by: None identified  Goals    . DIET - EAT MORE FRUITS AND VEGETABLES     Recommend to eat 3 small healthy meals and at least 2 healthy snacks per day.       Fall Risk Fall Risk  09/27/2018 09/27/2018 07/05/2018 03/28/2018 09/26/2017  Falls in the past year? No No No No No  Number falls in past yr: - - - - -  Injury with Fall? - - - - -  Risk for fall due to : Impaired vision Impaired vision - - -  Risk for fall due to: Comment wears eyeglasses  wears eyeglasses - - -    FALL RISK PREVENTION PERTAINING TO THE HOME:  Any stairs in or around the home WITH handrails? Rental home does not have a hand rail. Land lord to install Home free of loose throw rugs in walkways, pet beds, electrical cords, etc? Yes  Adequate lighting in your home to reduce risk of falls? Yes   ASSISTIVE DEVICES UTILIZED TO PREVENT FALLS:  Life alert? No  Use of a cane, walker or w/c? No  Grab bars in the bathroom? Yes  Shower chair or bench in shower? No  Elevated toilet seat or a handicapped toilet? No   DME ORDERS:  DME order needed?  No   TIMED UP AND GO:  Was the test performed? Yes .  Length of time to ambulate 10 feet: 12 sec.   GAIT:  Appearance of gait: Gait slow, steady and without the use of an assistive device.  Education: Fall risk prevention has been discussed.  Intervention(s) required? No   Depression Screen PHQ 2/9 Scores 09/27/2018 07/05/2018 03/28/2018 09/26/2017  PHQ - 2 Score 0 0 0 0  PHQ- 9 Score 0 - - -     Cognitive Function     6CIT Screen 09/27/2018  What Year? 0 points  What month? 0 points  What time? 0 points  Count back from 20 0 points  Months in reverse 0 points  Repeat phrase 0 points  Total Score 0    Immunization History  Administered Date(s) Administered  . Influenza, High Dose Seasonal PF 09/17/2015, 08/16/2016, 09/26/2017, 09/27/2018  . Influenza,inj,quad, With Preservative 09/18/2017  . Pneumococcal Conjugate-13 04/21/2015  . Pneumococcal Polysaccharide-23 08/16/2016  . Tdap 07/12/2014    Qualifies for Shingles Vaccine? Yes . Due for Shingrix. Education has been provided regarding the importance of this vaccine. Pt has been advised to call insurance company to determine out of pocket expense. Advised may also receive vaccine at local pharmacy or Health Dept. Verbalized acceptance and understanding.  Flu Vaccine: Due for Flu vaccine. Does the  patient want to receive this vaccine today?  Yes .     Screening Tests Health Maintenance  Topic Date Due  . MAMMOGRAM  08/08/2019  . DEXA SCAN  10/27/2019  . COLONOSCOPY  01/19/2022  . TETANUS/TDAP  07/12/2024  . INFLUENZA VACCINE  Completed  . Hepatitis C Screening  Completed  . PNA vac Low Risk Adult  Completed    Cancer Screenings:  Colorectal Screening: Completed 01/20/12. Repeat every 10 years  Mammogram: Completed 08/07/18. Repeat every year  Bone Density: Completed 10/26/17. Results reflect OSTEOPENIA. Repeat every 2 years.   Lung Cancer Screening: (Low Dose CT Chest recommended if Age 68-80 years, 30 pack-year currently smoking OR have quit w/in 15years.) does not qualify.   Additional Screening:  Hepatitis C Screening: Completed 08/23/16  Vision Screening: Recommended annual ophthalmology exams for early detection of glaucoma and other disorders of the eye. Is the patient up to date with their annual eye exam?  No  Who is the provider or what is the name of the office in which the pt attends annual eye exams? [facility]  Dental Screening: Recommended annual dental exams for proper oral hygiene  Community Resource Referral:  CRR required this visit?  Yes  Pt states she struggles paying her copays to specialist offices. Requesting financial assistance with paying her copays. A referral has been placed for pt to be seen by an audiologist for hearing changes. States she will need help paying her copay for that visit.    Plan:  I have personally reviewed and addressed the Medicare Annual Wellness questionnaire and have noted the following in the patient's chart:  A. Medical and social history B. Use of alcohol, tobacco or illicit drugs  C. Current medications and supplements D. Functional ability and status E.  Nutritional status F.  Physical activity G. Advance directives H. List of other physicians I.  Hospitalizations, surgeries, and ER visits in previous 12 months J.  Media such as hearing and  vision if needed, cognitive and depression L. Referrals and appointments  In addition, I have reviewed and discussed with patient certain preventive protocols, quality metrics, and best practice recommendations. A written personalized care plan for preventive services as well as general preventive health recommendations were provided to patient.  See attached scanned questionnaire for additional information.   Signed,  Aleatha Borer, LPN Nurse Health Advisor

## 2018-09-27 NOTE — Patient Instructions (Addendum)
Use a Water Pik instead of picking at the tonsil stones If you notice redness or suspect infection there, get medical evaluation right away Try tonic water before bed to help prevent cramps Try turmeric as a natural anti-inflammatory (for pain and arthritis). It comes in capsules where you buy aspirin and fish oil, but also as a spice where you buy pepper and garlic powder. inrease the metoprolol to 50 mg twice a day Return in 7-10 days to recheck pulse and BP with CMA

## 2018-10-01 DIAGNOSIS — M6281 Muscle weakness (generalized): Secondary | ICD-10-CM | POA: Diagnosis not present

## 2018-10-01 DIAGNOSIS — M25511 Pain in right shoulder: Secondary | ICD-10-CM | POA: Diagnosis not present

## 2018-10-01 DIAGNOSIS — Z96611 Presence of right artificial shoulder joint: Secondary | ICD-10-CM | POA: Diagnosis not present

## 2018-10-01 DIAGNOSIS — M25611 Stiffness of right shoulder, not elsewhere classified: Secondary | ICD-10-CM | POA: Diagnosis not present

## 2018-10-03 ENCOUNTER — Ambulatory Visit: Payer: PPO

## 2018-10-03 VITALS — BP 142/84 | HR 77

## 2018-10-03 DIAGNOSIS — I10 Essential (primary) hypertension: Secondary | ICD-10-CM

## 2018-10-03 NOTE — Progress Notes (Signed)
Pt here for blood pressure check since increasing Metoprolol to 50mg  bid.  Pt states that she has been having morning headaches and swelling in feet and ankles also that her blood pressure at home with device has been reading high as well. Blood pressure today is 142/84 and pulse 77.  I consulted with Benjamine Mola NP and we will continue current regimen and have patient follow up with provider in 1 week.

## 2018-10-04 ENCOUNTER — Ambulatory Visit: Payer: PPO

## 2018-10-04 DIAGNOSIS — M25611 Stiffness of right shoulder, not elsewhere classified: Secondary | ICD-10-CM | POA: Diagnosis not present

## 2018-10-04 DIAGNOSIS — M6281 Muscle weakness (generalized): Secondary | ICD-10-CM | POA: Diagnosis not present

## 2018-10-04 DIAGNOSIS — M25511 Pain in right shoulder: Secondary | ICD-10-CM | POA: Diagnosis not present

## 2018-10-04 DIAGNOSIS — Z96611 Presence of right artificial shoulder joint: Secondary | ICD-10-CM | POA: Diagnosis not present

## 2018-10-10 ENCOUNTER — Ambulatory Visit (INDEPENDENT_AMBULATORY_CARE_PROVIDER_SITE_OTHER): Payer: PPO | Admitting: Nurse Practitioner

## 2018-10-10 VITALS — BP 140/100 | HR 70 | Temp 98.4°F | Resp 16 | Ht 63.0 in | Wt 140.1 lb

## 2018-10-10 DIAGNOSIS — M7989 Other specified soft tissue disorders: Secondary | ICD-10-CM

## 2018-10-10 DIAGNOSIS — I10 Essential (primary) hypertension: Secondary | ICD-10-CM | POA: Diagnosis not present

## 2018-10-10 MED ORDER — HYDROCHLOROTHIAZIDE 25 MG PO TABS: 12.5000 mg | ORAL_TABLET | Freq: Every day | ORAL | 0 refills | Status: DC

## 2018-10-10 MED ORDER — LOSARTAN POTASSIUM 100 MG PO TABS: 100.0000 mg | ORAL_TABLET | Freq: Every day | ORAL | 0 refills | Status: DC

## 2018-10-10 NOTE — Patient Instructions (Addendum)
For blood pressure please take the following: - 50 mg of metoprolol at night  - 100mg  of losartan a day  - HCTZ 12.5mg  in the morning.   For leg swelling: - Please use compression stocking during the day; take them off at night - Elevate legs on pillows when resting - Reduce salt in diet.   - Please check your blood pressures 3 times a week when you are rested and relaxed and let us know what that is next week.

## 2018-10-10 NOTE — Progress Notes (Signed)
Name: Rachel James   MRN: [id number]    DOB: 01/19/1946   Date:10/10/2018       Progress Note  Subjective  Chief Complaint  Chief Complaint  Patient presents with  . Hypertension    still running high, with headaches and edema.  Dr. lada told to place on schedule    HPI  Patient had blood pressure medication changes since may; states was on metoprolol and chlorthalidone, stopped the chlorthalodine because of muscle cramping, switched to losartan- without any issues but blood pressure was not controlled. Was on amlodipine in the past with issues as well. States noted ankle swelling on the metoprolol and swelling further increased with increased dose of metoprolol at last visit. Tries to elevate legs on stool but causes knee pain sometimes.    Presently taking losartan 50mg  and metoprolol 50mg  BID.  BP Readings from Last 3 Encounters:  10/10/18 (!) 140/100  10/03/18 (!) 142/84  09/27/18 132/84      Patient Active Problem List   Diagnosis Date Noted  . Status post total shoulder arthroplasty 08/09/2018  . Pain in joint of right shoulder 05/23/2018  . Hyponatremia 03/31/2018  . Injury of tendon of long head of right biceps 11/01/2017  . Rotator cuff tendinitis, right 10/11/2017  . Arthritis of right glenohumeral joint 09/27/2017  . Chronic right shoulder pain 09/26/2017  . CKD (chronic kidney disease) stage 2, GFR 60-89 ml/min 07/11/2017  . Osteopenia 06/22/2017  . 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 Shoulder pain, bilateral 09/08/2016  . Need for prophylactic vaccination with Streptococcus pneumoniae (Pneumococcus) and Influenza vaccines 08/16/2016  . Preventative health care 08/16/2016  . Screen for STD (sexually transmitted disease) 08/16/2016  . Urinary frequency 08/16/2016  . Medication monitoring encounter 07/20/2016  . Cutaneous skin tags 04/18/2016  . Accidental fall 12/09/2015  . Hyperlipidemia LDL goal <100 09/17/2015  . Need for  immunization against influenza 09/17/2015  . Degenerative arthritis of lumbar spine with cord compression 07/16/2015  . Lumbar and sacral osteoarthritis 07/16/2015  . Bilateral change in hearing 05/27/2015  . Spinal stenosis, multilevel 05/27/2015  . GERD (gastroesophageal reflux disease) 05/27/2015  . Chronic radicular low back pain 05/27/2015  . Allergic rhinitis 01/06/2014  . Osteoarthrosis involving multiple sites 01/06/2014  . Arthropathia 01/06/2014  . Hypertension goal BP (blood pressure) < 140/90 01/06/2014  . Arthritis of right knee 01/06/2014    Past Medical History:  Diagnosis Date  . Allergy   . Arthritis    knees, lower back  . CKD (chronic kidney disease) stage 2, GFR 60-89 ml/min 07/11/2017  . Degenerative disc disease, lumbar   . Hypertension   . Osteopenia 06/22/2017   June 2016 DEXA  . PONV (postoperative nausea and vomiting)    in past.  none recently    Past Surgical History:  Procedure Laterality Date  .  THUMB SURGERY Left   . APPENDECTOMY    . BREAST BIOPSY Right 1992   neg  . BREAST LUMPECTOMY Right   . BUNIONECTOMY Bilateral   . CERVICAL DISCECTOMY  09/18/2012   C4-C5 AND C6-C7 ACDF   . SHOULDER ARTHROSCOPY WITH ROTATOR CUFF REPAIR AND OPEN BICEPS TENODESIS Right 12/20/2017   Procedure: SHOULDER ARTHROSCOPY WITH DEBRIDEMENT, DECOMPRESSION AND  BICEPS TENODESIS;  Surgeon: Corky Mull, MD;  Location: [facility];  Service: Orthopedics;  Laterality: Right;  . TOTAL SHOULDER ARTHROPLASTY Right 08/09/2018   Procedure: RIGHT TOTAL SHOULDER ARTHROPLASTY;  Surgeon: Justice Britain, MD;  Location:  [facility];  Service: Orthopedics;  Laterality: Right;  . TUBAL LIGATION Bilateral     Social History   Tobacco Use  . Smoking status: Never Smoker  . Smokeless tobacco: Never Used  . Tobacco comment: smoking cessation materials not required  Substance Use Topics  . Alcohol use: Not Currently     Current Outpatient Medications:  .  acetaminophen (TYLENOL)  500 MG tablet, Take 1,000 mg by mouth 2 (two) times daily., Disp: , Rfl:  .  aspirin 81 MG tablet, Take 81 mg by mouth daily. , Disp: , Rfl:  .  cholecalciferol (VITAMIN D) 1000 units tablet, Take 1 tablet (1,000 Units total) by mouth daily., Disp: , Rfl:  .  docusate sodium (COLACE) 100 MG capsule, Take 100 mg by mouth daily., Disp: , Rfl:  .  losartan (COZAAR) 50 MG tablet, Take 1 tablet (50 mg total) by mouth daily. (Patient taking differently: Take 50 mg by mouth every evening. ), Disp: 30 tablet, Rfl: 0 .  metoprolol succinate (TOPROL-XL) 50 MG 24 hr tablet, Take 1 tablet (50 mg total) by mouth 2 (two) times daily. Take with or immediately following a meal., Disp: 60 tablet, Rfl: 2 .  Multiple Vitamins-Minerals (MULTIVITAMIN PO), Take 1 tablet by mouth daily., Disp: , Rfl:  .  naproxen sodium (ANAPROX) 220 MG tablet, Take 440 mg by mouth 2 (two) times daily. , Disp: , Rfl:  .  simvastatin (ZOCOR) 10 MG tablet, Take 1 tablet (10 mg total) by mouth daily., Disp: 90 tablet, Rfl: 1 .  tiZANidine (ZANAFLEX) 4 MG tablet, Take 1 tablet (4 mg total) by mouth every 8 (eight) hours as needed for muscle spasms., Disp: 90 tablet, Rfl: 0  Allergies  Allergen Reactions  . Codeine Itching and Other (See Comments)    nightmare  . Oxycodone Itching    ROS   No other specific complaints in a complete review of systems (except as listed in HPI above).  Objective  Vitals:   10/10/18 1323 10/10/18 1352  BP: (!) 142/80 (!) 140/100  Pulse:  70  Resp:  16  Temp:  98.4 F (36.9 C)  TempSrc:  Oral  SpO2:  98%  Weight:  140 lb 1.6 oz (63.5 kg)  Height:  5\' 3"  (1.6 m)    Body mass index is 24.82 kg/m.  Nursing Note and Vital Signs reviewed.  Physical Exam  Constitutional: She is oriented to person, place, and time. She appears well-developed and well-nourished. She is cooperative.  HENT:  Head: Normocephalic and atraumatic.  Right Ear: Hearing normal.  Left Ear: Hearing normal.   Mouth/Throat: Mucous membranes are normal.  Eyes: Conjunctivae are normal.  Cardiovascular: Normal rate.  Pulmonary/Chest: Effort normal.  Abdominal: Normal appearance.  Musculoskeletal: Normal range of motion.  Neurological: She is alert and oriented to person, place, and time.  Psychiatric: She has a normal mood and affect. Her speech is normal and behavior is normal. Judgment and thought content normal.       No results found for this or any previous visit (from the past 48 hour(s)).  Assessment & Plan  1. Essential hypertension - 50 mg of metoprolol at night  - 100mg  of losartan a day  - HCTZ 12.5mg  in the morning.  - losartan (COZAAR) 100 MG tablet; Take 1 tablet (100 mg total) by mouth daily.  Dispense: 90 tablet; Refill: 0 - hydrochlorothiazide (HYDRODIURIL) 25 MG tablet; Take 0.5 tablets (12.5 mg total) by mouth daily.  Dispense: 90 tablet; Refill:  0  2. Leg swelling - Please use compression stocking during the day; take them off at night - Elevate legs on pillows when resting - Reduce salt in diet.   Discussed signs and symptoms of stroke.

## 2018-10-12 DIAGNOSIS — M25511 Pain in right shoulder: Secondary | ICD-10-CM | POA: Diagnosis not present

## 2018-10-12 DIAGNOSIS — M25611 Stiffness of right shoulder, not elsewhere classified: Secondary | ICD-10-CM | POA: Diagnosis not present

## 2018-10-12 DIAGNOSIS — Z96611 Presence of right artificial shoulder joint: Secondary | ICD-10-CM | POA: Diagnosis not present

## 2018-10-12 DIAGNOSIS — M6281 Muscle weakness (generalized): Secondary | ICD-10-CM | POA: Diagnosis not present

## 2018-10-15 DIAGNOSIS — M6281 Muscle weakness (generalized): Secondary | ICD-10-CM | POA: Diagnosis not present

## 2018-10-15 DIAGNOSIS — M25511 Pain in right shoulder: Secondary | ICD-10-CM | POA: Diagnosis not present

## 2018-10-15 DIAGNOSIS — M25611 Stiffness of right shoulder, not elsewhere classified: Secondary | ICD-10-CM | POA: Diagnosis not present

## 2018-10-15 DIAGNOSIS — Z96611 Presence of right artificial shoulder joint: Secondary | ICD-10-CM | POA: Diagnosis not present

## 2018-10-18 ENCOUNTER — Other Ambulatory Visit: Payer: Self-pay | Admitting: *Deleted

## 2018-10-18 DIAGNOSIS — Z96611 Presence of right artificial shoulder joint: Secondary | ICD-10-CM | POA: Diagnosis not present

## 2018-10-18 DIAGNOSIS — M25611 Stiffness of right shoulder, not elsewhere classified: Secondary | ICD-10-CM | POA: Diagnosis not present

## 2018-10-18 DIAGNOSIS — M25511 Pain in right shoulder: Secondary | ICD-10-CM | POA: Diagnosis not present

## 2018-10-18 DIAGNOSIS — M6281 Muscle weakness (generalized): Secondary | ICD-10-CM | POA: Diagnosis not present

## 2018-10-18 NOTE — Patient Outreach (Addendum)
[place] [facility]) Care Management  10/18/2018  Rachel James April 16, 1946 [id number]   Telephone Screen  Referral Date:10/18/18 Referral Source: MD referral - Dr Enid Derry, [facility] [name])  Referral Reason:for medication assistance- States she struggles with co pays Insurance:HTA   Outreach attempt # 1 Reached the pt She voiced concern about the call Reviewed referral to [facility] with patient She is unsure of needing medication assistance  She states she can not think of a medicine she would need assist with  CM referred her back to [facility] office  CM left CM's contact number with her to return a call if she later needs assistance   Plan: [facility] RN CM sent an unsuccessful outreach letter and scheduled this patient for another call attempt within 4 business days if she does not return a call to CM earlier     [place] L. Lavina Hamman, RN, BSN, [place] Coordinator Office number [phone] Mobile number [phone]  Main [facility] number [phone] Fax number [phone] l

## 2018-10-22 ENCOUNTER — Other Ambulatory Visit: Payer: Self-pay | Admitting: *Deleted

## 2018-10-22 DIAGNOSIS — M25611 Stiffness of right shoulder, not elsewhere classified: Secondary | ICD-10-CM | POA: Diagnosis not present

## 2018-10-22 DIAGNOSIS — Z96611 Presence of right artificial shoulder joint: Secondary | ICD-10-CM | POA: Diagnosis not present

## 2018-10-22 DIAGNOSIS — M6281 Muscle weakness (generalized): Secondary | ICD-10-CM | POA: Diagnosis not present

## 2018-10-22 DIAGNOSIS — M25511 Pain in right shoulder: Secondary | ICD-10-CM | POA: Diagnosis not present

## 2018-10-22 NOTE — Patient Outreach (Signed)
[place] [facility]) Care Management  10/22/2018  MALENI SEYER 02-22-1946 [id number]   Telephone Screen  Referral Date:10/18/18 Referral Source: MD referral - Dr Enid Derry, [facility] [name])  Referral Reason:for medication assistance- States she struggles with co pays Insurance:HTA   Outreach attempt # 2. No answer. [facility] RN CM left HIPAA compliant voicemail message along with CM's contact info.    Plan: [facility] RN CM scheduled this patient for another call attempt within 4 business days if she does not return a call to CM earlier     [place]. Lavina Hamman, RN, BSN, [place] Coordinator Office number [phone] Mobile number [phone]  Main [facility] number [phone] Fax number [phone]

## 2018-10-24 ENCOUNTER — Ambulatory Visit: Payer: PPO

## 2018-10-24 VITALS — BP 138/82 | HR 65

## 2018-10-24 DIAGNOSIS — I10 Essential (primary) hypertension: Secondary | ICD-10-CM

## 2018-10-24 NOTE — Progress Notes (Signed)
Patient here for 2 week blood pressure check.  Blood pressure today is 138/82 and pulse 65. Patient is currently on metoprolol 50mg  bid, Losartan 100mg  and HCTZ 25mg , she denies any side effects and swelling  Has went down in ankles.  Consulted with Dr. Sanda Klein and we will continue current regimen and patient will decrease salt in diet.  Dash diet given.

## 2018-10-24 NOTE — Patient Instructions (Signed)
DASH Eating Plan DASH stands for "Dietary Approaches to Stop Hypertension." The DASH eating plan is a healthy eating plan that has been shown to reduce high blood pressure (hypertension). It may also reduce your risk for type 2 diabetes, heart disease, and stroke. The DASH eating plan may also help with weight loss. What are tips for following this plan? General guidelines  Avoid eating more than 2,300 mg (milligrams) of salt (sodium) a day. If you have hypertension, you may need to reduce your sodium intake to 1,500 mg a day.  Limit alcohol intake to no more than 1 drink a day for nonpregnant women and 2 drinks a day for men. One drink equals 12 oz of beer, 5 oz of wine, or 1 oz of hard liquor.  Work with your health care provider to maintain a healthy body weight or to lose weight. Ask what an ideal weight is for you.  Get at least 30 minutes of exercise that causes your heart to beat faster (aerobic exercise) most days of the week. Activities may include walking, swimming, or biking.  Work with your health care provider or diet and nutrition specialist (dietitian) to adjust your eating plan to your individual calorie needs. Reading food labels  Check food labels for the amount of sodium per serving. Choose foods with less than 5 percent of the Daily Value of sodium. Generally, foods with less than 300 mg of sodium per serving fit into this eating plan.  To find whole grains, look for the word "whole" as the first word in the ingredient list. Shopping  Buy products labeled as "low-sodium" or "no salt added."  Buy fresh foods. Avoid canned foods and premade or frozen meals. Cooking  Avoid adding salt when cooking. Use salt-free seasonings or herbs instead of table salt or sea salt. Check with your health care provider or pharmacist before using salt substitutes.  Do not fry foods. Cook foods using healthy methods such as baking, boiling, grilling, and broiling instead.  Cook with  heart-healthy oils, such as olive, canola, soybean, or sunflower oil. Meal planning   Eat a balanced diet that includes: ? 5 or more servings of fruits and vegetables each day. At each meal, try to fill half of your plate with fruits and vegetables. ? Up to 6-8 servings of whole grains each day. ? Less than 6 oz of lean meat, poultry, or fish each day. A 3-oz serving of meat is about the same size as a deck of cards. One egg equals 1 oz. ? 2 servings of low-fat dairy each day. ? A serving of nuts, seeds, or beans 5 times each week. ? Heart-healthy fats. Healthy fats called Omega-3 fatty acids are found in foods such as flaxseeds and coldwater fish, like sardines, salmon, and mackerel.  Limit how much you eat of the following: ? Canned or prepackaged foods. ? Food that is high in trans fat, such as fried foods. ? Food that is high in saturated fat, such as fatty meat. ? Sweets, desserts, sugary drinks, and other foods with added sugar. ? Full-fat dairy products.  Do not salt foods before eating.  Try to eat at least 2 vegetarian meals each week.  Eat more home-cooked food and less restaurant, buffet, and fast food.  When eating at a restaurant, ask that your food be prepared with less salt or no salt, if possible. What foods are recommended? The items listed may not be a complete list. Talk with your dietitian about what   dietary choices are best for you. Grains Whole-grain or whole-wheat bread. Whole-grain or whole-wheat pasta. Brown rice. Oatmeal. Quinoa. Bulgur. Whole-grain and low-sodium cereals. Pita bread. Low-fat, low-sodium crackers. Whole-wheat flour tortillas. Vegetables Fresh or frozen vegetables (raw, steamed, roasted, or grilled). Low-sodium or reduced-sodium tomato and vegetable juice. Low-sodium or reduced-sodium tomato sauce and tomato paste. Low-sodium or reduced-sodium canned vegetables. Fruits All fresh, dried, or frozen fruit. Canned fruit in natural juice (without  added sugar). Meat and other protein foods Skinless chicken or turkey. Ground chicken or turkey. Pork with fat trimmed off. Fish and seafood. Egg whites. Dried beans, peas, or lentils. Unsalted nuts, nut butters, and seeds. Unsalted canned beans. Lean cuts of beef with fat trimmed off. Low-sodium, lean deli meat. Dairy Low-fat (1%) or fat-free (skim) milk. Fat-free, low-fat, or reduced-fat cheeses. Nonfat, low-sodium ricotta or cottage cheese. Low-fat or nonfat yogurt. Low-fat, low-sodium cheese. Fats and oils Soft margarine without trans fats. Vegetable oil. Low-fat, reduced-fat, or light mayonnaise and salad dressings (reduced-sodium). Canola, safflower, olive, soybean, and sunflower oils. Avocado. Seasoning and other foods Herbs. Spices. Seasoning mixes without salt. Unsalted popcorn and pretzels. Fat-free sweets. What foods are not recommended? The items listed may not be a complete list. Talk with your dietitian about what dietary choices are best for you. Grains Baked goods made with fat, such as croissants, muffins, or some breads. Dry pasta or rice meal packs. Vegetables Creamed or fried vegetables. Vegetables in a cheese sauce. Regular canned vegetables (not low-sodium or reduced-sodium). Regular canned tomato sauce and paste (not low-sodium or reduced-sodium). Regular tomato and vegetable juice (not low-sodium or reduced-sodium). Pickles. Olives. Fruits Canned fruit in a light or heavy syrup. Fried fruit. Fruit in cream or butter sauce. Meat and other protein foods Fatty cuts of meat. Ribs. Fried meat. Bacon. Sausage. Bologna and other processed lunch meats. Salami. Fatback. Hotdogs. Bratwurst. Salted nuts and seeds. Canned beans with added salt. Canned or smoked fish. Whole eggs or egg yolks. Chicken or turkey with skin. Dairy Whole or 2% milk, cream, and half-and-half. Whole or full-fat cream cheese. Whole-fat or sweetened yogurt. Full-fat cheese. Nondairy creamers. Whipped toppings.  Processed cheese and cheese spreads. Fats and oils Butter. Stick margarine. Lard. Shortening. Ghee. Bacon fat. Tropical oils, such as coconut, palm kernel, or palm oil. Seasoning and other foods Salted popcorn and pretzels. Onion salt, garlic salt, seasoned salt, table salt, and sea salt. Worcestershire sauce. Tartar sauce. Barbecue sauce. Teriyaki sauce. Soy sauce, including reduced-sodium. Steak sauce. Canned and packaged gravies. Fish sauce. Oyster sauce. Cocktail sauce. Horseradish that you find on the shelf. Ketchup. Mustard. Meat flavorings and tenderizers. Bouillon cubes. Hot sauce and Tabasco sauce. Premade or packaged marinades. Premade or packaged taco seasonings. Relishes. Regular salad dressings. Where to find more information:  National Heart, Lung, and Blood Institute: www.nhlbi.nih.gov  American Heart Association: www.heart.org Summary  The DASH eating plan is a healthy eating plan that has been shown to reduce high blood pressure (hypertension). It may also reduce your risk for type 2 diabetes, heart disease, and stroke.  With the DASH eating plan, you should limit salt (sodium) intake to 2,300 mg a day. If you have hypertension, you may need to reduce your sodium intake to 1,500 mg a day.  When on the DASH eating plan, aim to eat more fresh fruits and vegetables, whole grains, lean proteins, low-fat dairy, and heart-healthy fats.  Work with your health care provider or diet and nutrition specialist (dietitian) to adjust your eating plan to your individual   calorie needs. This information is not intended to replace advice given to you by your health care provider. Make sure you discuss any questions you have with your health care provider. Document Released: 11/24/2011 Document Revised: 11/28/2016 Document Reviewed: 11/28/2016 Elsevier Interactive Patient Education  2018 Elsevier Inc.  

## 2018-10-25 ENCOUNTER — Ambulatory Visit: Payer: Self-pay | Admitting: *Deleted

## 2018-10-25 DIAGNOSIS — M25511 Pain in right shoulder: Secondary | ICD-10-CM | POA: Diagnosis not present

## 2018-10-25 DIAGNOSIS — Z96611 Presence of right artificial shoulder joint: Secondary | ICD-10-CM | POA: Diagnosis not present

## 2018-10-25 DIAGNOSIS — M6281 Muscle weakness (generalized): Secondary | ICD-10-CM | POA: Diagnosis not present

## 2018-10-25 DIAGNOSIS — M25611 Stiffness of right shoulder, not elsewhere classified: Secondary | ICD-10-CM | POA: Diagnosis not present

## 2018-10-26 ENCOUNTER — Other Ambulatory Visit: Payer: Self-pay | Admitting: *Deleted

## 2018-10-26 ENCOUNTER — Telehealth: Payer: Self-pay

## 2018-10-26 NOTE — Telephone Encounter (Signed)
She states talked with someone today

## 2018-10-26 NOTE — Telephone Encounter (Signed)
Can you send a letter?

## 2018-10-26 NOTE — Patient Outreach (Addendum)
[facility]) Care Management  10/26/2018  ADELLA MANOLIS [birth year] [id number]   Telephone Screen  Referral Date:10/18/18 Referral Source:MD referral - Irena Cords [facility] Brett Canales) Referral Reason:for medication assistance-States she struggles with co pays Insurance:HTA   Outreach attempt #3 No answer. [facility] RN CM left HIPAA compliant voicemail message along with CM's contact info.    Plan: [facility] RN CM scheduled this patient for case closure  within 4 business daysifshe does not return a call to CM earlier   routed note to MD, Lada  CM called the MD office spoke with Ginny Forth to update Dr Sanda Klein of CM attempts to complete this referral without success  Nyla Creason L. Lavina Hamman, RN, BSN, [place] Coordinator Office number [phone] Mobile number [phone]  Main [facility] number [phone] Fax number [phone]

## 2018-10-26 NOTE — Addendum Note (Signed)
Addended by: Barbaraann Faster on: 10/26/2018 05:38 PM   Modules accepted: Orders

## 2018-10-26 NOTE — Telephone Encounter (Signed)
Copied from [place] [id number]. Topic: General - Other >> Oct 26, 2018 11:02 AM Yvette Rack wrote: Reason for CRM: Jackelyn Poling with [facility] called in to make Dr. Sanda Klein aware the she has not been able to reach the pt regarding the referral for med assistance. Joelene Millin stated she left pt 3 messages but pt has not responded.

## 2018-10-26 NOTE — Patient Outreach (Signed)
   [facility]) Care Management  10/26/2018  KERIANNE GURR Feb 20, 1946 [id number]   Care coordination    CM received a voice message from the patient in which she stated she would be having a nurse come to her home on 10/27/18 at noon but CM could call back prn CM returned a call to the patient and she again reports a visit from a Ms "Willadean Carol" on 10/27/18 "tomorrow at noon" and she states she made the appointment with this CM. CM corrected her to state that CM had not called her to set up an appointment and was not aware of what she may be referring to. CM offered to call her MD office to see if they assisted her with an appointment. Cm called and spoke with Constance Holster at the MD office. No indication that a MD staff scheduled an appointment for the patient on 10/27/18. CM reminded pt does have difficulty with hearing. [facility] RN CM called back to the patient to update her on the call to [facility] at the MD office confirming RN visit on 10/24/18 but no appointment set for 10/27/18 by the MD staff nor [facility] RN CM [facility] RN CM again reviewed [facility] referral from Dr Sanda Klein for medication assistance and asked the patient if she wanted Cm to complete the [facility] referral and assessment for medication assistance. She stated she did not think she needed medication assistance from [place] scheduled this patient for case closure  within 4 business days   Romir Klimowicz L. Lavina Hamman, RN, BSN, [place] Coordinator Office number [phone] Mobile number [phone]  Main [facility] number [phone] Fax number [phone]

## 2018-10-26 NOTE — Patient Outreach (Signed)
[facility]) Care Management  10/26/2018  BRITTA LOUTH Apr 25, 1946 [id number]   Telephone Screen  Referral Date:10/18/18 Referral Source:MD referral - Irena C[facility] Brett Canales) Referral Reason:for medication assistance-States she struggles with co pays Insurance:HTA  Mrs Rost returned a call to [facility] RN CM to inform CM she was confused earlier and would like to complete the [facility] assessment and be referred to [place] for assistance   Patient is able to verify HIPAA Reviewed and addressed referral to [facility] with patient again  Social: Mrs Branscomb is divorced with the support of her niece She reports she is independent with all her care and denies need for assist with transportation to medical appointments   Conditions: HTN, GERD, Allergies, chronic back pain, osteopenia, arthritis of multiple areas, right rotor cuff tendinitis, CKD, hx of accidental fall neoplasm of skin of nose, HOH  Falls none per pt   Medications: she report cost concern with need of 2 shingles shots per her MD office Reports the cost of each at $45 and she is not able to afford them  She reports cramps in her feet related to her diuretic   Appointments: f/u with primary MD Lada on  6 months in April 2020- last seen on 09/27/18 by Der Sanda Klein and on 10/24/18 by MD RN for RN wellness check  Advance Directives: Denies need for assist with advance directives    Consent: [facility] RN CM reviewed [facility] services with patient. Patient gave verbal consent for services for [place].  Plans [facility] assessment completed and  Mrs Paolillo will be referred to [place] She was informed she would be contacted by a [place] staff, if not reached a message and letter would be sent to her. She was encouraged to return the call the [facility] pharmacy staff   [place]. Lavina Hamman, RN, BSN, [place] Coordinator Office number [phone] Mobile number [phone]  Main [facility] number [phone] Fax number [phone]

## 2018-10-26 NOTE — Telephone Encounter (Signed)
Pt.notified

## 2018-10-29 DIAGNOSIS — M6281 Muscle weakness (generalized): Secondary | ICD-10-CM | POA: Diagnosis not present

## 2018-10-29 DIAGNOSIS — M25611 Stiffness of right shoulder, not elsewhere classified: Secondary | ICD-10-CM | POA: Diagnosis not present

## 2018-10-29 DIAGNOSIS — Z96611 Presence of right artificial shoulder joint: Secondary | ICD-10-CM | POA: Diagnosis not present

## 2018-10-29 DIAGNOSIS — M25511 Pain in right shoulder: Secondary | ICD-10-CM | POA: Diagnosis not present

## 2018-10-30 ENCOUNTER — Other Ambulatory Visit: Payer: Self-pay | Admitting: Pharmacist

## 2018-10-30 DIAGNOSIS — M1612 Unilateral primary osteoarthritis, left hip: Secondary | ICD-10-CM | POA: Diagnosis not present

## 2018-10-30 NOTE — Patient Outreach (Signed)
[facility]) Care Management  [name]   10/30/2018  Rachel James 02-20-1946 [id number]  Reason for referral: Medication   Referral source: Dr. Sanda Klein Referral medication(s): Shingrix vaccine Current insurance:HTA  PMHx: osteoarthritis, CKD-II, DDD, HTN, HLD, allergies  Outreach:  Successful call to Ms. Chaffin today. HIPAA identifiers verified. Patient reports she was told she needed to have the shingles vaccine, Shingrix, at an outpatient pharmacy.   She states she was told it would be a $45 copay for the 2 required vaccination injections.  Patient also states she has trouble splitting her HCTZ pill to take instructed dose 12.26m.    Objective: Allergies  Allergen Reactions  . Codeine Itching and Other (See Comments)    nightmare  . Oxycodone Itching    Medications Reviewed Today    Reviewed by SSibyl Parr CMA (Certified Medical Assistant) on 10/10/18 at 1324  Med List Status: <None>  Medication Order Taking? Sig Documenting Provider Last Dose Status Informant  acetaminophen (TYLENOL) 500 MG tablet [id number] Take 1,000 mg by mouth 2 (two) times daily. [provider]  Active Self  aspirin 81 MG tablet [id number] Take 81 mg by mouth daily.  [provider]  Active Self  cholecalciferol (VITAMIN D) 1000 units tablet [id number] Take 1 tablet (1,000 Units total) by mouth daily. LArnetha Courser MD  Active Self  docusate sodium (COLACE) 100 MG capsule [id number] Take 100 mg by mouth daily. [provider]  Active Self  losartan (COZAAR) 50 MG tablet [id number] Take 1 tablet (50 mg total) by mouth daily.  Patient taking differently:  Take 50 mg by mouth every evening.    PFredderick Severance NP  Active Self  metoprolol succinate (TOPROL-XL) 50 MG 24 hr tablet [id number] Take 1 tablet (50 mg total) by mouth 2 (two) times daily. Take with or immediately following a meal. Lada, MSatira Anis MD  Active   Multiple  Vitamins-Minerals (MULTIVITAMIN PO) [id number] Take 1 tablet by mouth daily. [provider]  Active Self  naproxen sodium (ANAPROX) 220 MG tablet [id number] Take 440 mg by mouth 2 (two) times daily.  [provider]  Active Self  simvastatin (ZOCOR) 10 MG tablet [id number] Take 1 tablet (10 mg total) by mouth daily. LArnetha Courser MD  Active   tiZANidine (ZANAFLEX) 4 MG tablet [id number] Take 1 tablet (4 mg total) by mouth every 8 (eight) hours as needed for muscle spasms. LArnetha Courser MD  Active           Assessment: Patient declined medication review telephonically.   Medication Review Findings:   Difficulty splitting HCTZ 244min half to make 12.71m31mue to small pill size.  Instructed patient on using a pillcutter rather a knife for safety.  Also will send request to Dr. LadSanda Klein send in new prescription for HCTZ 12.71mg36m patient will not have to continue to split her pills.  There is both a capsule and tablet formulation in 12.71mg.54mMedication Assistance Findings:   Difficulty affording shingles vaccine.    Per review of [place] formul[place], both Shingrix and Zostavax are Tier 3 = $45 co-pays.  Shingrix requires 2 injections while Zostavax requires 1.  Zostavax is a live vaccine therefore not recommended in immunosuppressed patients.  No medication or diagnosis noted for Ms. KrysaCanto would exclude Zostavax if necessary.  Zostavax would reduce cost from total of $90 of Shingrix to $45  for Zostavax as one less injection needed.    Extra Help:   _0  Already receiving Full Extra Help  _1  Already receiving Partial Extra Help  _2  Eligible based on reported income and assets  _3  Not Eligible based on reported income and assets  Patient Assistance Programs: 1) Shingrix made by [employer] o Income requirement met: _4  Yes _5  No _6  Unknown o Out-of-pocket prescription expenditure met:    _7  Yes _8  No  _9  Unknown  <HHIDUPBDHDIXBOER>_8<\/SXQKSKSHNGITJLLV>_74  Not applicable - PAP requires $69 TROOP which patient  reports she has not yet met        2)  Zostavax made by [employer] o Income requirement met: _11  Yes _12  No  _13  Unknown o Out-of-pocket prescription expenditure met:   _14  Yes _15  No   _16  Unknown <XVEZBMZTAEWYBRKV>_3<\/XLEZVGJFTNBZXYDS>_89  Not applicable - Appears that application is for in-office administration of vaccine as [employer] will replace the doses of vaccine administered to approved patients via quarterly shipments to licensed prescriber therefore unable to apply for this as provider office does not offer the vaccine.    Reviewed above findings with patient who voiced understanding.  She states she was told specifically to only have Shingrix vaccine.  She will try to budget enough money to afford the injections.  We reviewed schedule for the Shingrix vaccine and needing to have second shot 2-6 months after the 1st.  Patient voiced understanding.  She will likely have these done in 2020.   Plan: Will route note to Dr. Sanda Klein regarding HCTZ strength.  Will close case at this time as patient not eligible for PAP for Shingrix at this time.  Am happy to assist in the future if other needs arise.    Ralene Bathe, PharmD, [place] [phone]

## 2018-11-01 DIAGNOSIS — M6281 Muscle weakness (generalized): Secondary | ICD-10-CM | POA: Diagnosis not present

## 2018-11-01 DIAGNOSIS — M25611 Stiffness of right shoulder, not elsewhere classified: Secondary | ICD-10-CM | POA: Diagnosis not present

## 2018-11-01 DIAGNOSIS — Z96611 Presence of right artificial shoulder joint: Secondary | ICD-10-CM | POA: Diagnosis not present

## 2018-11-01 DIAGNOSIS — M25511 Pain in right shoulder: Secondary | ICD-10-CM | POA: Diagnosis not present

## 2018-11-05 DIAGNOSIS — Z471 Aftercare following joint replacement surgery: Secondary | ICD-10-CM | POA: Diagnosis not present

## 2018-11-05 DIAGNOSIS — M25511 Pain in right shoulder: Secondary | ICD-10-CM | POA: Diagnosis not present

## 2018-11-05 DIAGNOSIS — Z96611 Presence of right artificial shoulder joint: Secondary | ICD-10-CM | POA: Diagnosis not present

## 2018-11-06 DIAGNOSIS — M25611 Stiffness of right shoulder, not elsewhere classified: Secondary | ICD-10-CM | POA: Diagnosis not present

## 2018-11-06 DIAGNOSIS — M6281 Muscle weakness (generalized): Secondary | ICD-10-CM | POA: Diagnosis not present

## 2018-11-06 DIAGNOSIS — M25511 Pain in right shoulder: Secondary | ICD-10-CM | POA: Diagnosis not present

## 2018-11-06 DIAGNOSIS — Z96611 Presence of right artificial shoulder joint: Secondary | ICD-10-CM | POA: Diagnosis not present

## 2018-11-07 ENCOUNTER — Other Ambulatory Visit: Payer: Self-pay | Admitting: Family Medicine

## 2018-11-07 MED ORDER — HYDROCHLOROTHIAZIDE 12.5 MG PO CAPS: 12.5000 mg | ORAL_CAPSULE | Freq: Every day | ORAL | 2 refills | Status: DC

## 2018-11-07 NOTE — Progress Notes (Signed)
Request from pharm to send in the 12.5 mg hctz so she doesn't have to split

## 2018-11-08 DIAGNOSIS — M25611 Stiffness of right shoulder, not elsewhere classified: Secondary | ICD-10-CM | POA: Diagnosis not present

## 2018-11-08 DIAGNOSIS — M25511 Pain in right shoulder: Secondary | ICD-10-CM | POA: Diagnosis not present

## 2018-11-08 DIAGNOSIS — Z96611 Presence of right artificial shoulder joint: Secondary | ICD-10-CM | POA: Diagnosis not present

## 2018-11-08 DIAGNOSIS — M6281 Muscle weakness (generalized): Secondary | ICD-10-CM | POA: Diagnosis not present

## 2018-11-10 ENCOUNTER — Other Ambulatory Visit: Payer: Self-pay | Admitting: Family Medicine

## 2018-11-10 DIAGNOSIS — E785 Hyperlipidemia, unspecified: Secondary | ICD-10-CM

## 2018-12-07 ENCOUNTER — Encounter: Payer: Self-pay | Admitting: Family Medicine

## 2018-12-07 MED ORDER — ZOSTER VAC RECOMB ADJUVANTED 50 MCG/0.5ML IM SUSR: 0.5000 mL | Freq: Once | INTRAMUSCULAR | 0 refills | Status: AC

## 2019-01-07 ENCOUNTER — Encounter: Payer: Self-pay | Admitting: Family Medicine

## 2019-01-07 MED ORDER — METOPROLOL SUCCINATE ER 100 MG PO TB24: 100.0000 mg | ORAL_TABLET | Freq: Every day | ORAL | 1 refills | Status: DC

## 2019-01-13 ENCOUNTER — Encounter: Payer: Self-pay | Admitting: Family Medicine

## 2019-01-13 DIAGNOSIS — I10 Essential (primary) hypertension: Secondary | ICD-10-CM

## 2019-01-14 DIAGNOSIS — R69 Illness, unspecified: Secondary | ICD-10-CM | POA: Diagnosis not present

## 2019-01-15 MED ORDER — LOSARTAN POTASSIUM 100 MG PO TABS: 100.0000 mg | ORAL_TABLET | Freq: Every day | ORAL | 1 refills | Status: DC

## 2019-01-15 MED ORDER — HYDROCHLOROTHIAZIDE 12.5 MG PO CAPS: 12.5000 mg | ORAL_CAPSULE | Freq: Every day | ORAL | 1 refills | Status: DC

## 2019-02-18 ENCOUNTER — Encounter: Payer: Self-pay | Admitting: Family Medicine

## 2019-02-18 DIAGNOSIS — E785 Hyperlipidemia, unspecified: Secondary | ICD-10-CM

## 2019-02-20 MED ORDER — SIMVASTATIN 10 MG PO TABS: 10.0000 mg | ORAL_TABLET | Freq: Every day | ORAL | 1 refills | Status: DC

## 2019-03-12 DIAGNOSIS — M1711 Unilateral primary osteoarthritis, right knee: Secondary | ICD-10-CM | POA: Diagnosis not present

## 2019-03-12 DIAGNOSIS — G8929 Other chronic pain: Secondary | ICD-10-CM | POA: Diagnosis not present

## 2019-03-12 DIAGNOSIS — M25561 Pain in right knee: Secondary | ICD-10-CM | POA: Diagnosis not present

## 2019-03-17 ENCOUNTER — Encounter: Payer: Self-pay | Admitting: Family Medicine

## 2019-03-17 DIAGNOSIS — M4120 Other idiopathic scoliosis, site unspecified: Secondary | ICD-10-CM | POA: Insufficient documentation

## 2019-03-17 DIAGNOSIS — M1711 Unilateral primary osteoarthritis, right knee: Secondary | ICD-10-CM | POA: Insufficient documentation

## 2019-03-18 ENCOUNTER — Other Ambulatory Visit: Payer: Self-pay

## 2019-03-18 MED ORDER — TIZANIDINE HCL 4 MG PO TABS: 4.0000 mg | ORAL_TABLET | Freq: Three times a day (TID) | ORAL | 0 refills | Status: DC | PRN

## 2019-03-26 DIAGNOSIS — M1711 Unilateral primary osteoarthritis, right knee: Secondary | ICD-10-CM | POA: Diagnosis not present

## 2019-04-01 ENCOUNTER — Ambulatory Visit: Payer: PPO | Admitting: Family Medicine

## 2019-04-10 ENCOUNTER — Other Ambulatory Visit: Payer: Self-pay

## 2019-04-10 ENCOUNTER — Encounter: Payer: Self-pay | Admitting: Family Medicine

## 2019-04-10 ENCOUNTER — Ambulatory Visit (INDEPENDENT_AMBULATORY_CARE_PROVIDER_SITE_OTHER): Payer: Medicare HMO | Admitting: Family Medicine

## 2019-04-10 VITALS — BP 118/80 | HR 60 | Wt 139.0 lb

## 2019-04-10 DIAGNOSIS — M25511 Pain in right shoulder: Secondary | ICD-10-CM | POA: Diagnosis not present

## 2019-04-10 DIAGNOSIS — N182 Chronic kidney disease, stage 2 (mild): Secondary | ICD-10-CM

## 2019-04-10 DIAGNOSIS — M25512 Pain in left shoulder: Secondary | ICD-10-CM | POA: Diagnosis not present

## 2019-04-10 DIAGNOSIS — R32 Unspecified urinary incontinence: Secondary | ICD-10-CM | POA: Diagnosis not present

## 2019-04-10 DIAGNOSIS — I10 Essential (primary) hypertension: Secondary | ICD-10-CM

## 2019-04-10 DIAGNOSIS — E785 Hyperlipidemia, unspecified: Secondary | ICD-10-CM | POA: Diagnosis not present

## 2019-04-10 DIAGNOSIS — G8929 Other chronic pain: Secondary | ICD-10-CM

## 2019-04-10 DIAGNOSIS — R350 Frequency of micturition: Secondary | ICD-10-CM

## 2019-04-10 DIAGNOSIS — Z5181 Encounter for therapeutic drug level monitoring: Secondary | ICD-10-CM | POA: Diagnosis not present

## 2019-04-10 MED ORDER — LOSARTAN POTASSIUM 100 MG PO TABS: 100.0000 mg | ORAL_TABLET | Freq: Every day | ORAL | 1 refills | Status: DC

## 2019-04-10 MED ORDER — METOPROLOL SUCCINATE ER 100 MG PO TB24: 100.0000 mg | ORAL_TABLET | Freq: Every day | ORAL | 1 refills | Status: DC

## 2019-04-10 MED ORDER — NAPROXEN SODIUM 220 MG PO TABS: 220.0000 mg | ORAL_TABLET | Freq: Every day | ORAL | Status: DC | PRN

## 2019-04-10 MED ORDER — HYDROCHLOROTHIAZIDE 12.5 MG PO CAPS: 12.5000 mg | ORAL_CAPSULE | Freq: Every day | ORAL | 1 refills | Status: DC

## 2019-04-10 NOTE — Assessment & Plan Note (Signed)
Encouraged patient to stay hydrated; limit or avoid NSAIDs, explained long-term use can damage kidneys

## 2019-04-10 NOTE — Assessment & Plan Note (Signed)
Encouraged hydration; offered referral to urologist; order urine; limit caffeine

## 2019-04-10 NOTE — Assessment & Plan Note (Signed)
Limit NSAIDs because of kidney function

## 2019-04-10 NOTE — Progress Notes (Signed)
BP 118/80   Pulse 60   Wt 139 lb (63 kg)   BMI 24.62 kg/m    Subjective:    Patient ID: Rachel James, female    DOB: [birth year], 73 y.o.   MRN: [id number]  HPI: Rachel James is a 73 y.o. female  Chief Complaint  Patient presents with  . Follow-up  . Medication Refill    HPI Virtual Visit via Telephone/Video Note   I connected with the patient via:  telephone I verified that I am speaking with the correct person using two identifiers.  Call started: 9:40 am Call terminated: 10:02 am Total length of call: 22 minutes and 47 seconds   I discussed the limitations, risks, and privacy concerns of performing an evaluation and management service by telephone and the availability of in-person appointments. I explained that he/she may be responsible for charges related to this service. The patient expressed understanding and agreed to proceed.  Provider location: office Patient location: home Additional participants: no one   Hypertension; not monitoring BP at home right now; last BP was good, good this morning; she feels like this is the right mix of medications; occasional headache, thinks sinus related; no alcohol, across the forehead; no trouble speaking or swallowing or night sweats or other neurologic sx; adding some salt to her food; trying to keep it minimal; no decongestants She needs some refills today; she was in [place] but those refills are down there; she would like new refills at [place]  High cholesterol; no side effects from the statin; trying to limit fatty meats; eats more chicken; tries to get 93% lean meat, made hamburger soup the last time and didn't drain off the fat  Lab Results  Component Value Date   CHOL 176 09/27/2018   HDL 82 09/27/2018   [place] 78 09/27/2018   TRIG 76 09/27/2018   CHOLHDL 2.1 09/27/2018    She had some swelling of her foot and ankle; not a blood clot she says; completely gone away; started back on her BP medicine  metoprolol and it went away; has been taking naproxen 220 mg BID; she takes tylenol BID 1000 mg each dose; she has CKD stage 2; lower back and knees and shoulder, "I'm loaded with arthritis"; she does not drink much water because she goes to the bathroom so much; some incontinence with laughing or sneezing to the point where she wears a pad, light day pantyliner for years; no visible blood; this has been going on for years, just annoying; not painful, no dysuria  She has been making masks; no s/s of COVID-19; no known exposures to COVID-19  She has wax build up in her one ear, had her hearing tested and they said there was wax build-up  Depression screen [facility] 2/9 10/26/2018 10/10/2018 09/27/2018 07/05/2018 03/28/2018  Decreased Interest 0 0 0 0 0  Down, Depressed, Hopeless 0 0 0 0 0  PHQ - 2 Score 0 0 0 0 0  Altered sleeping - - 0 - -  Tired, decreased energy - - 0 - -  Change in appetite - - 0 - -  Feeling bad or failure about yourself  - - 0 - -  Trouble concentrating - - 0 - -  Moving slowly or fidgety/restless - - 0 - -  Suicidal thoughts - - 0 - -  PHQ-9 Score - - 0 - -  Difficult doing work/chores - - Not difficult at all - -   Fall Risk  04/10/2019 10/10/2018 09/27/2018 09/27/2018 07/05/2018  Falls in the past year? 0 No No No No  Number falls in past yr: 0 - - - -  Injury with Fall? 0 - - - -  Risk for fall due to : - - Impaired vision Impaired vision -  Risk for fall due to: Comment - - wears eyeglasses wears eyeglasses -    Relevant past medical, surgical, family and social history reviewed Past Medical History:  Diagnosis Date  . Allergy   . Arthritis    knees, lower back  . CKD (chronic kidney disease) stage 2, GFR 60-89 ml/min 07/11/2017  . Degenerative disc disease, lumbar   . Hypertension   . Osteopenia 06/22/2017   June 2016 DEXA  . PONV (postoperative nausea and vomiting)    in past.  none recently   Past Surgical History:  Procedure Laterality Date  .  THUMB  SURGERY Left   . APPENDECTOMY    . BREAST BIOPSY Right 1992   neg  . BREAST LUMPECTOMY Right   . BUNIONECTOMY Bilateral   . CERVICAL DISCECTOMY  09/18/2012   C4-C5 AND C6-C7 ACDF   . SHOULDER ARTHROSCOPY WITH ROTATOR CUFF REPAIR AND OPEN BICEPS TENODESIS Right 12/20/2017   Procedure: SHOULDER ARTHROSCOPY WITH DEBRIDEMENT, DECOMPRESSION AND  BICEPS TENODESIS;  Surgeon: Corky Mull, MD;  Location: [employer];  Service: Orthopedics;  Laterality: Right;  . TOTAL SHOULDER ARTHROPLASTY Right 08/09/2018   Procedure: RIGHT TOTAL SHOULDER ARTHROPLASTY;  Surgeon: Justice Britain, MD;  Location: [place];  Service: Orthopedics;  Laterality: Right;  . TUBAL LIGATION Bilateral    Family History  Problem Relation Age of Onset  . Heart disease Mother   . Heart attack Mother   . Heart disease Father   . Heart disease Sister   . Heart disease Brother   . Heart failure Brother   . Memory loss Brother   . Diabetes Paternal Grandmother   . Pulmonary embolism Sister   . Breast cancer Neg Hx    Social History   Tobacco Use  . Smoking status: Never Smoker  . Smokeless tobacco: Never Used  . Tobacco comment: smoking cessation materials not required  Substance Use Topics  . Alcohol use: Not Currently  . Drug use: No     [facility] Visit from 04/10/2019 in [facility]  AUDIT-C Score  0      Interim medical history since last visit reviewed. Allergies and medications reviewed  Review of Systems Per HPI unless specifically indicated above     Objective:    BP 118/80   Pulse 60   Wt 139 lb (63 kg)   BMI 24.62 kg/m   Wt Readings from Last 3 Encounters:  04/10/19 139 lb (63 kg)  10/10/18 140 lb 1.6 oz (63.5 kg)  09/27/18 135 lb 6.4 oz (61.4 kg)    Physical Exam Pulmonary:     Effort: No respiratory distress.  Neurological:     Mental Status: She is alert.     Cranial Nerves: No dysarthria.  Psychiatric:        Speech: Speech is not rapid and pressured, delayed or  slurred.    Results for orders placed or performed in visit on 09/27/18  COMPLETE METABOLIC PANEL WITH GFR  Result Value Ref Range   Glucose, Bld 79 65 - 99 mg/dL   BUN 14 7 - 25 mg/dL   Creat 0.74 0.60 - 0.93 mg/dL   GFR, Est Non African American 82 >  OR = 60 mL/min/1.61m2   GFR, Est African American 95 > OR = 60 mL/min/1.98m2   BUN/Creatinine Ratio NOT APPLICABLE 6 - 22 (calc)   Sodium 138 135 - 146 mmol/L   Potassium 4.1 3.5 - 5.3 mmol/L   Chloride 102 98 - 110 mmol/L   CO2 28 20 - 32 mmol/L   Calcium 9.8 8.6 - 10.4 mg/dL   Total Protein 6.6 6.1 - 8.1 g/dL   Albumin 4.3 3.6 - 5.1 g/dL   Globulin 2.3 1.9 - 3.7 g/dL (calc)   AG Ratio 1.9 1.0 - 2.5 (calc)   Total Bilirubin 0.8 0.2 - 1.2 mg/dL   Alkaline phosphatase (APISO) 42 33 - 130 U/L   AST 23 10 - 35 U/L   ALT 12 6 - 29 U/L  Lipid panel  Result Value Ref Range   Cholesterol 176 <200 mg/dL   HDL 82 >50 mg/dL   Triglycerides 76 <150 mg/dL   LDL Cholesterol (Calc) 78 mg/dL (calc)   Total CHOL/HDL Ratio 2.1 <5.0 (calc)   Non-HDL Cholesterol (Calc) 94 <130 mg/dL (calc)      Assessment & Plan:   Problem List Items Addressed This Visit      Cardiovascular and Mediastinum   Hypertension goal BP (blood pressure) < 140/90 (Chronic)    Well-controlled; continue meds; limit salt, refills given      Relevant Medications   metoprolol succinate (TOPROL-XL) 100 MG 24 hr tablet   losartan (COZAAR) 100 MG tablet   hydrochlorothiazide (MICROZIDE) 12.5 MG capsule     Genitourinary   CKD (chronic kidney disease) stage 2, GFR 60-89 ml/min - Primary    Encouraged patient to stay hydrated; limit or avoid NSAIDs, explained long-term use can damage kidneys        Other   Medication monitoring encounter   Relevant Orders   COMPLETE METABOLIC PANEL WITH GFR   Hyperlipidemia LDL goal <100 (Chronic)   Relevant Medications   metoprolol succinate (TOPROL-XL) 100 MG 24 hr tablet   losartan (COZAAR) 100 MG tablet    hydrochlorothiazide (MICROZIDE) 12.5 MG capsule   Other Relevant Orders   Lipid panel   Urinary frequency    Encouraged hydration; offered referral to urologist; order urine; limit caffeine      Relevant Orders   Urinalysis w microscopic + reflex cultur   Ambulatory referral to Urology   Shoulder pain, bilateral    Limit NSAIDs because of kidney function       Other Visit Diagnoses    Essential hypertension       Relevant Medications   metoprolol succinate (TOPROL-XL) 100 MG 24 hr tablet   losartan (COZAAR) 100 MG tablet   hydrochlorothiazide (MICROZIDE) 12.5 MG capsule   Urinary incontinence, unspecified type       Relevant Orders   Ambulatory referral to Urology       Follow up plan: Return in about 1 week (around 04/17/2019) for follow-up visit with Dr. Sanda Klein - IN PERSON.  Meds ordered this encounter  Medications  . metoprolol succinate (TOPROL-XL) 100 MG 24 hr tablet    Sig: Take 1 tablet (100 mg total) by mouth daily. Take with or immediately following a meal.    Dispense:  90 tablet    Refill:  1  . losartan (COZAAR) 100 MG tablet    Sig: Take 1 tablet (100 mg total) by mouth daily.    Dispense:  90 tablet    Refill:  1  . hydrochlorothiazide (MICROZIDE) 12.5 MG capsule  Sig: Take 1 capsule (12.5 mg total) by mouth daily.    Dispense:  90 capsule    Refill:  1    Patient is requesting 12.5 mg instead of splitting the 25  . naproxen sodium (ALEVE) 220 MG tablet    Sig: Take 1 tablet (220 mg total) by mouth daily as needed.   Orders Placed This Encounter  Procedures  . COMPLETE METABOLIC PANEL WITH GFR  . Lipid panel  . Urinalysis w microscopic + reflex cultur  . Ambulatory referral to Urology

## 2019-04-10 NOTE — Assessment & Plan Note (Signed)
Well-controlled; continue meds; limit salt, refills given

## 2019-04-17 ENCOUNTER — Ambulatory Visit: Payer: Medicare HMO | Admitting: Family Medicine

## 2019-04-17 ENCOUNTER — Other Ambulatory Visit: Payer: Self-pay

## 2019-04-17 DIAGNOSIS — E785 Hyperlipidemia, unspecified: Secondary | ICD-10-CM

## 2019-04-17 DIAGNOSIS — R350 Frequency of micturition: Secondary | ICD-10-CM

## 2019-04-17 DIAGNOSIS — Z5181 Encounter for therapeutic drug level monitoring: Secondary | ICD-10-CM

## 2019-04-18 ENCOUNTER — Encounter: Payer: Self-pay | Admitting: Family Medicine

## 2019-04-18 ENCOUNTER — Other Ambulatory Visit: Payer: Self-pay | Admitting: Family Medicine

## 2019-04-18 DIAGNOSIS — E871 Hypo-osmolality and hyponatremia: Secondary | ICD-10-CM

## 2019-04-18 LAB — COMPLETE METABOLIC PANEL WITH GFR
AG Ratio: 1.9 (calc) (ref 1.0–2.5)
ALT: 12 U/L (ref 6–29)
AST: 19 U/L (ref 10–35)
Albumin: 4.2 g/dL (ref 3.6–5.1)
Alkaline phosphatase (APISO): 34 U/L — ABNORMAL LOW (ref 37–153)
BUN: 11 mg/dL (ref 7–25)
CO2: 29 mmol/L (ref 20–32)
Calcium: 9.6 mg/dL (ref 8.6–10.4)
Chloride: 97 mmol/L — ABNORMAL LOW (ref 98–110)
Creat: 0.83 mg/dL (ref 0.60–0.93)
GFR, Est African American: 82 mL/min/{1.73_m2} (ref >=60)
GFR, Est Non African American: 71 mL/min/{1.73_m2} (ref >=60)
Globulin: 2.2 g/dL (calc) (ref 1.9–3.7)
Glucose, Bld: 88 mg/dL (ref 65–99)
Potassium: 4.3 mmol/L (ref 3.5–5.3)
Sodium: 132 mmol/L — ABNORMAL LOW (ref 135–146)
Total Bilirubin: 0.6 mg/dL (ref 0.2–1.2)
Total Protein: 6.4 g/dL (ref 6.1–8.1)

## 2019-04-18 LAB — LIPID PANEL
Cholesterol: 189 mg/dL (ref <200)
HDL: 87 mg/dL (ref >=50)
LDL Cholesterol (Calc): 85 mg/dL (calc)
Non-HDL Cholesterol (Calc): 102 mg/dL (calc) (ref <130)
Total CHOL/HDL Ratio: 2.2 (calc) (ref <5.0)
Triglycerides: 76 mg/dL (ref <150)

## 2019-04-18 LAB — URINALYSIS W MICROSCOPIC + REFLEX CULTURE
Bacteria, UA: NONE SEEN /HPF
Bilirubin Urine: NEGATIVE
Glucose, UA: NEGATIVE
Hgb urine dipstick: NEGATIVE
Hyaline Cast: NONE SEEN /LPF
Ketones, ur: NEGATIVE
Leukocyte Esterase: NEGATIVE
Nitrites, Initial: NEGATIVE
Protein, ur: NEGATIVE
RBC / HPF: NONE SEEN /HPF (ref 0–2)
Specific Gravity, Urine: 1.006 (ref 1.001–1.03)
Squamous Epithelial / LPF: NONE SEEN /HPF (ref <=5)
WBC, UA: NONE SEEN /HPF (ref 0–5)
pH: 8 (ref 5.0–8.0)

## 2019-04-18 LAB — NO CULTURE INDICATED

## 2019-05-01 ENCOUNTER — Telehealth: Payer: Self-pay | Admitting: Family Medicine

## 2019-05-01 NOTE — Telephone Encounter (Addendum)
-----   Message from Hubbard Hartshorn, FNP sent at 04/18/2019  7:55 AM EDT ----- Regarding: Call patient to come in for repeat BMP Call patient to come in for repeat BMP - order is in place.  This is to recheck her sodium level.

## 2019-05-01 NOTE — Telephone Encounter (Signed)
Documentation reviewed 

## 2019-05-01 NOTE — Telephone Encounter (Signed)
Patient stated that when she come in for labs it is a $35 co pay and she really cannot do that right now. SHe has been drinking plenty of water and cutting out salt

## 2019-05-06 DIAGNOSIS — R69 Illness, unspecified: Secondary | ICD-10-CM | POA: Diagnosis not present

## 2019-05-21 DIAGNOSIS — M47816 Spondylosis without myelopathy or radiculopathy, lumbar region: Secondary | ICD-10-CM | POA: Diagnosis not present

## 2019-05-21 DIAGNOSIS — M5416 Radiculopathy, lumbar region: Secondary | ICD-10-CM | POA: Diagnosis not present

## 2019-05-21 DIAGNOSIS — M1612 Unilateral primary osteoarthritis, left hip: Secondary | ICD-10-CM | POA: Diagnosis not present

## 2019-05-21 DIAGNOSIS — M48062 Spinal stenosis, lumbar region with neurogenic claudication: Secondary | ICD-10-CM | POA: Diagnosis not present

## 2019-05-21 DIAGNOSIS — M5136 Other intervertebral disc degeneration, lumbar region: Secondary | ICD-10-CM | POA: Diagnosis not present

## 2019-05-22 ENCOUNTER — Encounter: Payer: Self-pay | Admitting: Family Medicine

## 2019-05-22 DIAGNOSIS — E785 Hyperlipidemia, unspecified: Secondary | ICD-10-CM

## 2019-05-22 DIAGNOSIS — I10 Essential (primary) hypertension: Secondary | ICD-10-CM

## 2019-05-22 MED ORDER — SIMVASTATIN 10 MG PO TABS: 10.0000 mg | ORAL_TABLET | Freq: Every day | ORAL | 1 refills | Status: DC

## 2019-05-22 MED ORDER — LOSARTAN POTASSIUM 100 MG PO TABS: 100.0000 mg | ORAL_TABLET | Freq: Every day | ORAL | 1 refills | Status: DC

## 2019-05-27 DIAGNOSIS — Z471 Aftercare following joint replacement surgery: Secondary | ICD-10-CM | POA: Diagnosis not present

## 2019-05-27 DIAGNOSIS — M25512 Pain in left shoulder: Secondary | ICD-10-CM | POA: Diagnosis not present

## 2019-05-27 DIAGNOSIS — Z96611 Presence of right artificial shoulder joint: Secondary | ICD-10-CM | POA: Diagnosis not present

## 2019-05-30 ENCOUNTER — Encounter: Payer: Self-pay | Admitting: Family Medicine

## 2019-05-31 ENCOUNTER — Encounter: Payer: Self-pay | Admitting: Family Medicine

## 2019-06-04 ENCOUNTER — Encounter: Payer: Self-pay | Admitting: Family Medicine

## 2019-06-04 ENCOUNTER — Ambulatory Visit: Payer: Self-pay

## 2019-06-04 ENCOUNTER — Ambulatory Visit (INDEPENDENT_AMBULATORY_CARE_PROVIDER_SITE_OTHER): Payer: Medicare HMO | Admitting: Family Medicine

## 2019-06-04 ENCOUNTER — Other Ambulatory Visit: Payer: Self-pay

## 2019-06-04 VITALS — BP 118/72 | HR 72 | Temp 98.2°F | Resp 16 | Ht 61.0 in | Wt 143.6 lb

## 2019-06-04 DIAGNOSIS — G8929 Other chronic pain: Secondary | ICD-10-CM | POA: Diagnosis not present

## 2019-06-04 DIAGNOSIS — M5412 Radiculopathy, cervical region: Secondary | ICD-10-CM

## 2019-06-04 DIAGNOSIS — H6982 Other specified disorders of Eustachian tube, left ear: Secondary | ICD-10-CM

## 2019-06-04 DIAGNOSIS — R6884 Jaw pain: Secondary | ICD-10-CM | POA: Diagnosis not present

## 2019-06-04 DIAGNOSIS — H6121 Impacted cerumen, right ear: Secondary | ICD-10-CM

## 2019-06-04 DIAGNOSIS — Z598 Other problems related to housing and economic circumstances: Secondary | ICD-10-CM | POA: Diagnosis not present

## 2019-06-04 DIAGNOSIS — M26629 Arthralgia of temporomandibular joint, unspecified side: Secondary | ICD-10-CM

## 2019-06-04 DIAGNOSIS — Z599 Problem related to housing and economic circumstances, unspecified: Secondary | ICD-10-CM

## 2019-06-04 MED ORDER — DEBROX 6.5 % OT SOLN: 5.0000 [drp] | Freq: Two times a day (BID) | OTIC | 0 refills | Status: DC

## 2019-06-04 MED ORDER — PREDNISONE 10 MG (48) PO TBPK: ORAL_TABLET | ORAL | 0 refills | Status: DC

## 2019-06-04 MED ORDER — FLUTICASONE PROPIONATE 50 MCG/ACT NA SUSP: 2.0000 | Freq: Every day | NASAL | 6 refills | Status: DC

## 2019-06-04 NOTE — Patient Instructions (Signed)

## 2019-06-04 NOTE — Progress Notes (Signed)
This encounter was created in error - please disregard.

## 2019-06-04 NOTE — Progress Notes (Signed)
Name: Rachel James   MRN: [id number]    DOB: 04-03-1946   Date:06/04/2019       Progress Note  Subjective  Chief Complaint  Chief Complaint  Patient presents with  . Neck Pain    neck stiffness  . Jaw Pain    right side of face  . Ear Fullness    HPI  Jaw Pain: She went to the dentist as she has been experiencing some cracking and popping and is concerned that she may have arthritis in the right jaw.  She was given a mouth guard which is too bulky and causes her to not be able to sleep.  She was told by her dentist that she has TMJ and was referred to a specialist.  She notes that she is stressed about co-pays for specialists and she does not want to see a specialist if possible.  She is still able to chew her food and eat.  She is taking tylenol and naproxen BID for her arthritis pain throughout her body.  She has also tried apply bengay to the jaw without relief. She would like to explore PT as an option for her jaw as well.   Financial Concerns: She is seeing several specialists and is concerned about paying for the co-pays.  She is also unable to afford the second shingrix vaccination.   Bilateral Ear fullness: she has had ongoing cerumen impactions that recur intermittently; saw an audiologist in the fall who gave her some debrox to use, but she feels like that is not effective at this time.  Neck Pain: Onoing for many years, has been having a worse that normal flare over the last few weeks. Some tingling in the bilateral hands, no weakness in any extremity, no numbness/tingling in the BLE.  She has history of cervical spine fusion many years ago. She has tried tizanidine which does not seem to help her pain, she has also tried heat, stretching, bengay all without relief.   Patient Active Problem List   Diagnosis Date Noted  . Status post total shoulder arthroplasty 08/09/2018  . Pain in joint of right shoulder 05/23/2018  . Hyponatremia 03/31/2018  . Injury of tendon of long  head of right biceps 11/01/2017  . Rotator cuff tendinitis, right 10/11/2017  . Arthritis of right glenohumeral joint 09/27/2017  . Chronic right shoulder pain 09/26/2017  . CKD (chronic kidney disease) stage 2, GFR 60-89 ml/min 07/11/2017  . Osteopenia 06/22/2017  . 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 Shoulder pain, bilateral 09/08/2016  . Need for prophylactic vaccination with Streptococcus pneumoniae (Pneumococcus) and Influenza vaccines 08/16/2016  . Preventative health care 08/16/2016  . Screen for STD (sexually transmitted disease) 08/16/2016  . Urinary frequency 08/16/2016  . Medication monitoring encounter 07/20/2016  . Cutaneous skin tags 04/18/2016  . Accidental fall 12/09/2015  . Hyperlipidemia LDL goal <100 09/17/2015  . Need for immunization against influenza 09/17/2015  . Degenerative arthritis of lumbar spine with cord compression 07/16/2015  . Lumbar and sacral osteoarthritis 07/16/2015  . Bilateral change in hearing 05/27/2015  . Spinal stenosis, multilevel 05/27/2015  . GERD (gastroesophageal reflux disease) 05/27/2015  . Chronic radicular low back pain 05/27/2015  . Allergic rhinitis 01/06/2014  . Osteoarthrosis involving multiple sites 01/06/2014  . Arthropathia 01/06/2014  . Hypertension goal BP (blood pressure) < 140/90 01/06/2014  . Arthritis of right knee 01/06/2014    Social History   Tobacco Use  . Smoking status: Never Smoker  .  Smokeless tobacco: Never Used  . Tobacco comment: smoking cessation materials not required  Substance Use Topics  . Alcohol use: Not Currently     Current Outpatient Medications:  .  acetaminophen (TYLENOL) 500 MG tablet, Take 1,000 mg by mouth 2 (two) times daily., Disp: , Rfl:  .  aspirin 81 MG tablet, Take 81 mg by mouth daily. , Disp: , Rfl:  .  cholecalciferol (VITAMIN D) 1000 units tablet, Take 1 tablet (1,000 Units total) by mouth daily., Disp: , Rfl:  .  docusate sodium (COLACE) 100 MG  capsule, Take 100 mg by mouth daily., Disp: , Rfl:  .  hydrochlorothiazide (MICROZIDE) 12.5 MG capsule, Take 1 capsule (12.5 mg total) by mouth daily., Disp: 90 capsule, Rfl: 1 .  losartan (COZAAR) 100 MG tablet, Take 1 tablet (100 mg total) by mouth daily., Disp: 90 tablet, Rfl: 1 .  magnesium oxide (MAG-OX) 400 MG tablet, Take 400 mg by mouth daily., Disp: , Rfl:  .  metoprolol succinate (TOPROL-XL) 100 MG 24 hr tablet, Take 1 tablet (100 mg total) by mouth daily. Take with or immediately following a meal., Disp: 90 tablet, Rfl: 1 .  Multiple Vitamins-Minerals (MULTIVITAMIN PO), Take 1 tablet by mouth daily., Disp: , Rfl:  .  naproxen sodium (ALEVE) 220 MG tablet, Take 1 tablet (220 mg total) by mouth daily as needed., Disp: , Rfl:  .  simvastatin (ZOCOR) 10 MG tablet, Take 1 tablet (10 mg total) by mouth daily., Disp: 90 tablet, Rfl: 1 .  tiZANidine (ZANAFLEX) 4 MG tablet, Take 1 tablet (4 mg total) by mouth every 8 (eight) hours as needed for muscle spasms., Disp: 90 tablet, Rfl: 0  Allergies  Allergen Reactions  . Codeine Itching and Other (See Comments)    nightmare  . Oxycodone Itching    I personally reviewed active problem list, medication list, allergies, notes from last encounter, lab results with the patient/caregiver today.  ROS  Ten systems reviewed and is negative except as mentioned in HPI  Objective  Vitals:   06/04/19 0937  BP: 118/72  Pulse: 72  Resp: 16  Temp: 98.2 F (36.8 C)  TempSrc: Oral  SpO2: 95%  Weight: 143 lb 9.6 oz (65.1 kg)  Height: 5\' 1"  (1.549 m)   Body mass index is 27.13 kg/m.  Nursing Note and Vital Signs reviewed.  Physical Exam  Constitutional: Patient appears well-developed and well-nourished.  No distress.  HEENT: head atraumatic, normocephalic, pupils equal and reactive to light, RIGHT TM is blocked by mild cerumen impaction, the LEFT ear is clear with normal TM,  bilateral maxillary and frontal sinuses are non-tender, neck supple  without lymphadenopathy, throat within normal limits - no erythema or exudate, no tonsillar swelling. Jaw movement is smooth, no clicks or pops Cardiovascular: Normal rate, regular rhythm and normal heart sounds.  No murmur heard. No BLE edema. Pulmonary/Chest: Effort normal and breath sounds clear bilaterally. No respiratory distress. Psychiatric: Patient has a normal mood and affect. behavior is normal. Judgment and thought content normal.  No results found for this or any previous visit (from the past 72 hour(s)).  Assessment & Plan  1. Chronic jaw pain - Routine care, obtain mouth guard for nightly use, stress reductions - Ambulatory referral to Chronic Care Management Services  2. Financial difficulties - Ambulatory referral to Chronic Care Management Services  3. Cervical radiculitis - predniSONE (STERAPRED UNI-PAK 48 TAB) 10 MG (48) TBPK tablet; Per package instructions.  Dispense: 48 tablet; Refill: 0  4.  Right ear impacted cerumen - carbamide peroxide (DEBROX) 6.5 % OTIC solution; Place 5 drops into the right ear 2 (two) times daily.  Dispense: 15 mL; Refill: 0  5. Dysfunction of Eustachian tube, left - fluticasone (FLONASE) 50 MCG/ACT nasal spray; Place 2 sprays into both nostrils daily.  Dispense: 16 g; Refill: 6   -Red flags and when to present for emergency care or RTC including fever >101.87F, chest pain, shortness of breath, new/worsening/un-resolving symptoms, reviewed with patient at time of visit. Follow up and care instructions discussed and provided in AVS.

## 2019-06-04 NOTE — Addendum Note (Signed)
Addended by: Trish Fountain E on: 06/04/2019 03:00 PM   Modules accepted: Level of Service, SmartSet

## 2019-06-04 NOTE — Chronic Care Management (AMB) (Deleted)
  Chronic Care Management   Note  06/04/2019 Name: Rachel James MRN: [id number] DOB: [birth year]  Care Coordination: [name] is a 73 year old female patient of [facility] who was referred to CCM Team by Suezanne Cheshire, NP for care coordination and assistance with locating an affordable "TMJ specialist". Order will be placed for careguide assistance to provide available resources for affordable dental care as a start. If careguide is unable to assist patient or meets barriers to care, CCM RN CM will be happy to assist.  Plan: Referral to Chronic Case Management [place] Team    Amiah Frohlich E. Rollene Rotunda, RN, BSN Nurse Care Coordinator [facility] Care Management  [phone]

## 2019-06-04 NOTE — Chronic Care Management (AMB) (Signed)
  Chronic Care Management   Note  06/04/2019 Name: Rachel James MRN: [id number] DOB: [birth year]  Care Coordination: [name] is a 73 year old female patient of [facility] who was referred to CCM Team by Suezanne Cheshire, NP for care coordination and assistance with locating an affordable "TMJ specialist". Order will be placed for careguide assistance to provide available resources for affordable dental care as a start. If careguide is unable to assist patient or meets barriers to care, CCM RN CM will be happy to assist.  Plan: Referral to Chronic Case Management [place] Team   Rachel James E. Rollene Rotunda, RN, BSN Nurse Care Coordinator [facility] Care Management  [phone]

## 2019-06-10 ENCOUNTER — Encounter: Payer: Self-pay | Admitting: Family Medicine

## 2019-06-10 ENCOUNTER — Ambulatory Visit: Payer: Medicare HMO | Admitting: Urology

## 2019-06-12 ENCOUNTER — Telehealth: Payer: Self-pay

## 2019-06-12 NOTE — Telephone Encounter (Signed)
06/12/2019 Spoke with patient about the [facility].  Will follow-up Friday to see if patient was able to contact.MA

## 2019-06-12 NOTE — Telephone Encounter (Signed)
06/12/2019 Left message on voicemail to return my call regarding dental care resource.MA

## 2019-06-18 ENCOUNTER — Encounter: Payer: Self-pay | Admitting: Family Medicine

## 2019-06-20 ENCOUNTER — Other Ambulatory Visit: Payer: Self-pay | Admitting: Family Medicine

## 2019-06-24 MED ORDER — TIZANIDINE HCL 4 MG PO TABS: 4.0000 mg | ORAL_TABLET | Freq: Three times a day (TID) | ORAL | 0 refills | Status: DC | PRN

## 2019-06-25 ENCOUNTER — Other Ambulatory Visit: Payer: Self-pay | Admitting: Family Medicine

## 2019-06-25 ENCOUNTER — Encounter: Payer: Self-pay | Admitting: Family Medicine

## 2019-06-26 DIAGNOSIS — R69 Illness, unspecified: Secondary | ICD-10-CM | POA: Diagnosis not present

## 2019-06-26 MED ORDER — TIZANIDINE HCL 4 MG PO TABS: 4.0000 mg | ORAL_TABLET | Freq: Three times a day (TID) | ORAL | 0 refills | Status: DC | PRN

## 2019-07-01 ENCOUNTER — Other Ambulatory Visit: Payer: Self-pay

## 2019-07-01 ENCOUNTER — Ambulatory Visit: Payer: Medicare HMO | Admitting: Urology

## 2019-07-01 ENCOUNTER — Encounter: Payer: Self-pay | Admitting: Urology

## 2019-07-01 VITALS — BP 127/80 | HR 80 | Ht 61.0 in | Wt 142.0 lb

## 2019-07-01 DIAGNOSIS — N3946 Mixed incontinence: Secondary | ICD-10-CM

## 2019-07-01 DIAGNOSIS — R350 Frequency of micturition: Secondary | ICD-10-CM | POA: Diagnosis not present

## 2019-07-01 LAB — URINALYSIS, COMPLETE
Bilirubin, UA: NEGATIVE
Glucose, UA: NEGATIVE
Ketones, UA: NEGATIVE
Leukocytes,UA: NEGATIVE
Nitrite, UA: NEGATIVE
Protein,UA: NEGATIVE
RBC, UA: NEGATIVE
Specific Gravity, UA: 1.02 (ref 1.005–1.030)
Urobilinogen, Ur: 0.2 mg/dL (ref 0.2–1.0)
pH, UA: 6.5 (ref 5.0–7.5)

## 2019-07-01 LAB — MICROSCOPIC EXAMINATION
Bacteria, UA: NONE SEEN
Epithelial Cells (non renal): NONE SEEN /hpf (ref 0–10)
RBC, Urine: NONE SEEN /hpf (ref 0–2)

## 2019-07-01 MED ORDER — MIRABEGRON ER 25 MG PO TB24: 25.0000 mg | ORAL_TABLET | Freq: Every day | ORAL | 11 refills | Status: DC

## 2019-07-01 NOTE — Progress Notes (Signed)
07/01/2019 2:40 PM   Rachel James Rachel James 01/19/1946 [id number]  Referring provider: Arnetha Courser, MD [address] [place],  [place] [address]  Chief Complaint  Patient presents with  . Urinary Frequency    New Patient    HPI: Was consulted to assist the patient's urgent bladder.  When she drinks a lot of water when she goes from a sitting to standing position she has urgency and has to run.  She has mild but not daily urge incontinence.  She might leak it with a cough or sneeze that she was full.  She denies bedwetting.  She wears 1 light pad per day  She voids every 1 or 2 hours and has no nocturia  She has had neck surgery.  She denies a history of kidney stones or previous GU surgery.  No hysterectomy.  No prior treatment  Modifying factors: There are no other modifying factors  Associated signs and symptoms: There are no other associated signs and symptoms Aggravating and relieving factors: There are no other aggravating or relieving factors Severity: Moderate Duration: Persistent   PMH: Past Medical History:  Diagnosis Date  . Allergy   . Arthritis    knees, lower back  . CKD (chronic kidney disease) stage 2, GFR 60-89 ml/min 07/11/2017  . Degenerative disc disease, lumbar   . Hypertension   . Osteopenia 06/22/2017   June 2016 DEXA  . PONV (postoperative nausea and vomiting)    in past.  none recently    Surgical History: Past Surgical History:  Procedure Laterality Date  .  THUMB SURGERY Left   . APPENDECTOMY    . BREAST BIOPSY Right 1992   neg  . BREAST LUMPECTOMY Right   . BUNIONECTOMY Bilateral   . CERVICAL DISCECTOMY  09/18/2012   C4-C5 AND C6-C7 ACDF   . SHOULDER ARTHROSCOPY WITH ROTATOR CUFF REPAIR AND OPEN BICEPS TENODESIS Right 12/20/2017   Procedure: SHOULDER ARTHROSCOPY WITH DEBRIDEMENT, DECOMPRESSION AND  BICEPS TENODESIS;  Surgeon: Corky Mull, MD;  Location: [facility];  Service: Orthopedics;  Laterality: Right;  . TOTAL  SHOULDER ARTHROPLASTY Right 08/09/2018   Procedure: RIGHT TOTAL SHOULDER ARTHROPLASTY;  Surgeon: Justice Britain, MD;  Location: [facility];  Service: Orthopedics;  Laterality: Right;  . TUBAL LIGATION Bilateral     Home Medications:  Allergies as of 07/01/2019      Reactions   Codeine Itching, Other (See Comments)   nightmare   Oxycodone Itching      Medication List       Accurate as of July 01, 2019  2:40 PM. If you have any questions, ask your nurse or doctor.        STOP taking these medications   predniSONE 10 MG (48) Tbpk tablet Commonly known as: STERAPRED UNI-PAK 48 TAB Stopped by: Reece Packer, MD     TAKE these medications   acetaminophen 500 MG tablet Commonly known as: TYLENOL Take 1,000 mg by mouth 2 (two) times daily.   aspirin 81 MG tablet Take 81 mg by mouth daily.   cholecalciferol 1000 units tablet Commonly known as: VITAMIN D Take 1 tablet (1,000 Units total) by mouth daily.   Debrox 6.5 % OTIC solution Generic drug: carbamide peroxide Place 5 drops into the right ear 2 (two) times daily.   docusate sodium 100 MG capsule Commonly known as: COLACE Take 100 mg by mouth daily.   fluticasone 50 MCG/ACT nasal spray Commonly known as: FLONASE Place 2 sprays into  both nostrils daily.   hydrochlorothiazide 12.5 MG capsule Commonly known as: MICROZIDE Take 1 capsule (12.5 mg total) by mouth daily.   losartan 100 MG tablet Commonly known as: COZAAR Take 1 tablet (100 mg total) by mouth daily.   magnesium oxide 400 MG tablet Commonly known as: MAG-OX Take 400 mg by mouth daily.   metoprolol succinate 100 MG 24 hr tablet Commonly known as: TOPROL-XL Take 1 tablet (100 mg total) by mouth daily. Take with or immediately following a meal.   MULTIVITAMIN PO Take 1 tablet by mouth daily.   naproxen sodium 220 MG tablet Commonly known as: ALEVE Take 1 tablet (220 mg total) by mouth daily as needed.   simvastatin 10 MG tablet Commonly known as:  ZOCOR Take 1 tablet (10 mg total) by mouth daily.   tiZANidine 4 MG tablet Commonly known as: Zanaflex Take 1 tablet (4 mg total) by mouth every 8 (eight) hours as needed for muscle spasms.       Allergies:  Allergies  Allergen Reactions  . Codeine Itching and Other (See Comments)    nightmare  . Oxycodone Itching    Family History: Family History  Problem Relation Age of Onset  . Heart disease Mother   . Heart attack Mother   . Heart disease Father   . Heart disease Sister   . Heart disease Brother   . Heart failure Brother   . Memory loss Brother   . Diabetes Paternal Grandmother   . Pulmonary embolism Sister   . Breast cancer Neg Hx     Social History:  reports that she has never smoked. She has never used smokeless tobacco. She reports previous alcohol use. She reports that she does not use drugs.  ROS: UROLOGY Frequent Urination?: Yes Hard to postpone urination?: Yes Burning/pain with urination?: No Get up at night to urinate?: No Leakage of urine?: No Urine stream starts and stops?: No Trouble starting stream?: No Do you have to strain to urinate?: No Blood in urine?: No Urinary tract infection?: No Sexually transmitted disease?: No Injury to kidneys or bladder?: No Painful intercourse?: No Weak stream?: No Currently pregnant?: No Vaginal bleeding?: No Last menstrual period?: n  Gastrointestinal Nausea?: No Vomiting?: No Indigestion/heartburn?: No Diarrhea?: No Constipation?: No  Constitutional Fever: No Night sweats?: No Weight loss?: No Fatigue?: No  Skin Skin rash/lesions?: No Itching?: No  Eyes Blurred vision?: No Double vision?: No  Ears/Nose/Throat Sore throat?: No Sinus problems?: Yes  Hematologic/Lymphatic Swollen glands?: No Easy bruising?: No  Cardiovascular Leg swelling?: No Chest pain?: No  Respiratory Cough?: No Shortness of breath?: No  Endocrine Excessive thirst?: No  Musculoskeletal Back pain?: Yes  Joint pain?: Yes  Neurological Headaches?: No Dizziness?: No  Psychologic Depression?: No Anxiety?: No  Physical Exam: BP 127/80   Pulse 80   Ht 5\' 1"  (1.549 m)   Wt 142 lb (64.4 kg)   BMI 26.83 kg/m   Constitutional:  Alert and oriented, No acute distress. HEENT: [place] AT, moist mucus membranes.  Trachea midline, no masses. Cardiovascular: No clubbing, cyanosis, or edema. Respiratory: Normal respiratory effort, no increased work of breathing. GI: Abdomen is soft, nontender, nondistended, no abdominal masses GU: No CVA tenderness.  Well supported bladder neck and no stress incontinence Skin: No rashes, bruises or suspicious lesions. Lymph: No cervical or inguinal adenopathy. Neurologic: Grossly intact, no focal deficits, moving all 4 extremities. Psychiatric: Normal mood and affect.  Laboratory Data: Lab Results  Component Value Date   WBC  6.3 08/03/2018   HGB 12.2 08/03/2018   HCT 37.4 08/03/2018   MCV 93.5 08/03/2018   PLT 226 08/03/2018    Lab Results  Component Value Date   CREATININE 0.83 04/17/2019    No results found for: PSA  No results found for: TESTOSTERONE  No results found for: HGBA1C  Urinalysis    Component Value Date/Time   COLORURINE YELLOW 04/17/2019 [place] 04/17/2019 0854   APPEARANCEUR Clear 08/16/2016 1528   LABSPEC 1.006 04/17/2019 0854   PHURINE 8.0 04/17/2019 0854   GLUCOSEU NEGATIVE 04/17/2019 0854   HGBUR NEGATIVE 04/17/2019 0854   BILIRUBINUR Negative 08/16/2016 1528   [place] 04/17/2019 0854   PROTEINUR NEGATIVE 04/17/2019 0854   NITRITE Negative 08/16/2016 1528   LEUKOCYTESUR Negative 08/16/2016 1528    Pertinent Imaging:   Assessment & Plan: Patient has mild mixed incontinence but primarily has urgency triggered from a sitting to standing position.  The role of medical therapy discussed.  Reassess in 6 weeks on Myrbetriq samples and prescription 25 mg  There are no diagnoses linked to this  encounter.  No follow-ups on file.  Reece Packer, MD  [facility] [address] [place], [place] [address] [phone]

## 2019-07-03 DIAGNOSIS — H2513 Age-related nuclear cataract, bilateral: Secondary | ICD-10-CM | POA: Diagnosis not present

## 2019-07-03 DIAGNOSIS — I10 Essential (primary) hypertension: Secondary | ICD-10-CM | POA: Diagnosis not present

## 2019-07-03 DIAGNOSIS — H04123 Dry eye syndrome of bilateral lacrimal glands: Secondary | ICD-10-CM | POA: Diagnosis not present

## 2019-07-05 DIAGNOSIS — H52209 Unspecified astigmatism, unspecified eye: Secondary | ICD-10-CM | POA: Diagnosis not present

## 2019-07-05 DIAGNOSIS — H524 Presbyopia: Secondary | ICD-10-CM | POA: Diagnosis not present

## 2019-07-05 DIAGNOSIS — H5203 Hypermetropia, bilateral: Secondary | ICD-10-CM | POA: Diagnosis not present

## 2019-07-08 ENCOUNTER — Telehealth: Payer: Self-pay

## 2019-07-08 NOTE — Telephone Encounter (Signed)
07/08/2019 called to follow-up with patient about [facility].  Left voicemail message to return my call. [name] [phone].

## 2019-07-09 ENCOUNTER — Telehealth: Payer: Self-pay

## 2019-07-09 NOTE — Telephone Encounter (Signed)
07/09/2019 Received voicemail message from patient [facility] is unable to treat her TMJ. Spoke with April at [facility]. Someone will call the patient today to schedule an appointment. They accept Medicare and they are taking new patients without a referral. Left voicemail message on patient's phone to expect call from [facility]. [name] [phone]

## 2019-07-12 ENCOUNTER — Telehealth: Payer: Self-pay

## 2019-07-12 NOTE — Telephone Encounter (Signed)
07/12/2019 Spoke with patient. She has not received a call from the [facility] yet.  She is out of town so I gave her the number to call and schedule when she returns. [name] [phone]

## 2019-07-17 DIAGNOSIS — M47816 Spondylosis without myelopathy or radiculopathy, lumbar region: Secondary | ICD-10-CM | POA: Diagnosis not present

## 2019-07-27 ENCOUNTER — Encounter: Payer: Self-pay | Admitting: Family Medicine

## 2019-07-29 ENCOUNTER — Telehealth: Payer: Self-pay

## 2019-07-29 MED ORDER — HYDROCHLOROTHIAZIDE 12.5 MG PO CAPS: 12.5000 mg | ORAL_CAPSULE | Freq: Every day | ORAL | 1 refills | Status: DC

## 2019-07-29 MED ORDER — METOPROLOL SUCCINATE ER 100 MG PO TB24: 100.0000 mg | ORAL_TABLET | Freq: Every day | ORAL | 1 refills | Status: DC

## 2019-07-29 NOTE — Telephone Encounter (Signed)
07/29/2019 Spoke with patient her TMJ has improved and she has decided to wait on  treatment. [name] [phone]

## 2019-07-31 ENCOUNTER — Other Ambulatory Visit: Payer: Self-pay | Admitting: Family Medicine

## 2019-08-07 ENCOUNTER — Encounter: Payer: Self-pay | Admitting: Family Medicine

## 2019-08-07 DIAGNOSIS — M47816 Spondylosis without myelopathy or radiculopathy, lumbar region: Secondary | ICD-10-CM | POA: Diagnosis not present

## 2019-08-12 ENCOUNTER — Encounter: Payer: Self-pay | Admitting: Family Medicine

## 2019-08-12 ENCOUNTER — Other Ambulatory Visit: Payer: Self-pay

## 2019-08-12 ENCOUNTER — Ambulatory Visit (INDEPENDENT_AMBULATORY_CARE_PROVIDER_SITE_OTHER): Payer: Medicare HMO | Admitting: Family Medicine

## 2019-08-12 VITALS — BP 132/74 | HR 76 | Temp 97.6°F | Resp 16 | Ht 61.0 in | Wt 144.0 lb

## 2019-08-12 DIAGNOSIS — M5412 Radiculopathy, cervical region: Secondary | ICD-10-CM

## 2019-08-12 DIAGNOSIS — M8580 Other specified disorders of bone density and structure, unspecified site: Secondary | ICD-10-CM

## 2019-08-12 DIAGNOSIS — Z23 Encounter for immunization: Secondary | ICD-10-CM | POA: Diagnosis not present

## 2019-08-12 DIAGNOSIS — J3089 Other allergic rhinitis: Secondary | ICD-10-CM

## 2019-08-12 DIAGNOSIS — I10 Essential (primary) hypertension: Secondary | ICD-10-CM

## 2019-08-12 DIAGNOSIS — E785 Hyperlipidemia, unspecified: Secondary | ICD-10-CM | POA: Diagnosis not present

## 2019-08-12 DIAGNOSIS — M150 Primary generalized (osteo)arthritis: Secondary | ICD-10-CM | POA: Diagnosis not present

## 2019-08-12 DIAGNOSIS — R6884 Jaw pain: Secondary | ICD-10-CM

## 2019-08-12 DIAGNOSIS — R350 Frequency of micturition: Secondary | ICD-10-CM | POA: Diagnosis not present

## 2019-08-12 DIAGNOSIS — K219 Gastro-esophageal reflux disease without esophagitis: Secondary | ICD-10-CM

## 2019-08-12 DIAGNOSIS — H6982 Other specified disorders of Eustachian tube, left ear: Secondary | ICD-10-CM | POA: Insufficient documentation

## 2019-08-12 DIAGNOSIS — H6992 Unspecified Eustachian tube disorder, left ear: Secondary | ICD-10-CM

## 2019-08-12 DIAGNOSIS — M159 Polyosteoarthritis, unspecified: Secondary | ICD-10-CM

## 2019-08-12 DIAGNOSIS — E871 Hypo-osmolality and hyponatremia: Secondary | ICD-10-CM

## 2019-08-12 DIAGNOSIS — N182 Chronic kidney disease, stage 2 (mild): Secondary | ICD-10-CM

## 2019-08-12 DIAGNOSIS — G8929 Other chronic pain: Secondary | ICD-10-CM

## 2019-08-12 LAB — BASIC METABOLIC PANEL WITH GFR
BUN: 15 mg/dL (ref 7–25)
CO2: 34 mmol/L — ABNORMAL HIGH (ref 20–32)
Calcium: 10.3 mg/dL (ref 8.6–10.4)
Chloride: 98 mmol/L (ref 98–110)
Creat: 0.89 mg/dL (ref 0.60–0.93)
GFR, Est African American: 76 mL/min/{1.73_m2} (ref >=60)
GFR, Est Non African American: 65 mL/min/{1.73_m2} (ref >=60)
Glucose, Bld: 86 mg/dL (ref 65–99)
Potassium: 4.5 mmol/L (ref 3.5–5.3)
Sodium: 137 mmol/L (ref 135–146)

## 2019-08-12 MED ORDER — MELOXICAM 7.5 MG PO TABS: 7.5000 mg | ORAL_TABLET | Freq: Every day | ORAL | 0 refills | Status: DC

## 2019-08-12 NOTE — Progress Notes (Signed)
Name: Rachel James   MRN: [id number]    DOB: 03-26-1946   Date:08/12/2019       Progress Note  Subjective  Chief Complaint  Chief Complaint  Patient presents with   Follow-up   Medication Refill   Ear Fullness    HPI  Jaw Pain: Pain has improved significantly on its own over the last month. She went to the dentist as she has been experiencing some cracking and popping and is concerned that she may have arthritis in the right jaw.  She was given a mouth guard which is too bulky and causes her to not be able to sleep.  She was told by her dentist that she has TMJ and was referred to a specialist.  She is still able to chew her food and eat.  Bilateral Ear fullness: she has had ongoing cerumen impactions that recur intermittently; saw an audiologist in the fall who gave her some debrox to use, but she feels like that is not effective at this time.  She was using flonase and this did not help. We will evaluate today to see if she is in need of lavage.  AR: No longer using flonase; had one week of sneezing and is otherwise doing well.  Neck Pain: Onoing for many years, flares when she is stressed because she carries her stress in her neck. Some tingling in the bilateral hands, no weakness in any extremity, no numbness/tingling in the BLE.  She has history of cervical spine fusion many years ago. She has tried tizanidine which does help her, she has also tried heat, stretching. We discussed massage and PT.  She notes PT copay is $30 and this is too much at this time.   Arthritis: RIGHT knee, shoulders, lower back.  She is working with Dr. Clydell Hakim office.  She is planning a TKR in October 2020. She has had steroid injection into the right knee which helped her pain a bit; also wearing a brace on her knee which seems to help.  She is taking tylenol and naproxen - has been on this for years, we will change to MEloxicam to see if this will help with her pain.  She does need to take low dose  due to some CKD.  Osteopenia: Taking vitamin D, eating plenty of calcium. Likes to do exercises, but weight bearing exercises are painful due to her chronic pain.   HTN: Does not check at home -does take medications as prescribed - current regimen includes Losartan 100mg , metoprolol and HCTZ 12.5mg .  - taking medications as instructed, no medication side effects noted, no TIAs, no chest pain on exertion, no dyspnea on exertion, no swelling of ankles, no palpitations - DASH diet discussed - pt does follow a low sodium diet  CKD: on ARB; needs labs today. Last kidney function was normal.  Overactive Bladder: Seeing urology; did not think Myrbetriq helped her symptoms, so she stopped after a 90-day trial.  Returns in about a month.  HLD: Taking simvastatin; no chest pain, shortness of breath or myalgias.  She has been tolerating without issue.  Last Lipids are at goal today. On ASA 81mg  daily  GERD: Diet controlled - chocolate seems to be a trigger.   No pain, no regurgitation, no difficulty swallowing.   Patient Active Problem List   Diagnosis Date Noted   Status post total shoulder arthroplasty 08/09/2018   Pain in joint of right shoulder 05/23/2018   Hyponatremia 03/31/2018   Injury of tendon  of long head of right biceps 11/01/2017   Rotator cuff tendinitis, right 10/11/2017   Arthritis of right glenohumeral joint 09/27/2017   Chronic right shoulder pain 09/26/2017   CKD (chronic kidney disease) stage 2, GFR 60-89 ml/min 07/11/2017   Osteopenia 06/22/2017   Neoplasm of uncertain behavior of skin of nose 09/08/2016   Shoulder pain, bilateral 09/08/2016   Need for prophylactic vaccination with Streptococcus pneumoniae (Pneumococcus) and Influenza vaccines 08/16/2016   Preventative health care 08/16/2016   Screen for STD (sexually transmitted disease) 08/16/2016   Urinary frequency 08/16/2016   Medication monitoring encounter 07/20/2016   Cutaneous skin tags  04/18/2016   Accidental fall 12/09/2015   Hyperlipidemia LDL goal <100 09/17/2015   Need for immunization against influenza 09/17/2015   Degenerative arthritis of lumbar spine with cord compression 07/16/2015   Lumbar and sacral osteoarthritis 07/16/2015   Bilateral change in hearing 05/27/2015   Spinal stenosis, multilevel 05/27/2015   GERD (gastroesophageal reflux disease) 05/27/2015   Chronic radicular low back pain 05/27/2015   Allergic rhinitis 01/06/2014   Osteoarthrosis involving multiple sites 01/06/2014   Arthropathia 01/06/2014   Hypertension goal BP (blood pressure) < 140/90 01/06/2014   Arthritis of right knee 01/06/2014    Past Surgical History:  Procedure Laterality Date    THUMB SURGERY Left    APPENDECTOMY     BREAST BIOPSY Right 1992   neg   BREAST LUMPECTOMY Right    BUNIONECTOMY Bilateral    CERVICAL DISCECTOMY  09/18/2012   C4-C5 AND C6-C7 ACDF    SHOULDER ARTHROSCOPY WITH ROTATOR CUFF REPAIR AND OPEN BICEPS TENODESIS Right 12/20/2017   Procedure: SHOULDER ARTHROSCOPY WITH DEBRIDEMENT, DECOMPRESSION AND  BICEPS TENODESIS;  Surgeon: Corky Mull, MD;  Location: [facility];  Service: Orthopedics;  Laterality: Right;   TOTAL SHOULDER ARTHROPLASTY Right 08/09/2018   Procedure: RIGHT TOTAL SHOULDER ARTHROPLASTY;  Surgeon: Justice Britain, MD;  Location: [facility];  Service: Orthopedics;  Laterality: Right;   TUBAL LIGATION Bilateral     Family History  Problem Relation Age of Onset   Heart disease Mother    Heart attack Mother    Heart disease Father    Heart disease Sister    Heart disease Brother    Heart failure Brother    Memory loss Brother    Diabetes Paternal Grandmother    Pulmonary embolism Sister    Breast cancer Neg Hx     Social History   Socioeconomic History   Marital status: Divorced    Spouse name: Not on file   Number of children: 3   Years of education: Not on file   Highest education level:  Associate degree: occupational, Hotel manager, or vocational program  Occupational History   Occupation: retired  Scientist, product/process development strain: Not hard at all   Food insecurity    Worry: Never true    Inability: Never true   Transportation needs    Medical: No    Non-medical: No  Tobacco Use   Smoking status: Never Smoker   Smokeless tobacco: Never Used   Tobacco comment: smoking cessation materials not required  Substance and Sexual Activity   Alcohol use: Not Currently   Drug use: No   Sexual activity: Not Currently  Lifestyle   Physical activity    Days per week: 0 days    Minutes per session: 0 min   Stress: Not at all  Relationships   Social connections    Talks on phone: Patient refused  Gets together: Patient refused    Attends religious service: Patient refused    Active member of club or organization: Patient refused    Attends meetings of clubs or organizations: Patient refused    Relationship status: Divorced   Intimate partner violence    Fear of current or ex partner: No    Emotionally abused: No    Physically abused: No    Forced sexual activity: No  Other Topics Concern   Not on file  Social History Narrative   Not on file     Current Outpatient Medications:    acetaminophen (TYLENOL) 500 MG tablet, Take 1,000 mg by mouth 2 (two) times daily., Disp: , Rfl:    aspirin 81 MG tablet, Take 81 mg by mouth daily. , Disp: , Rfl:    carbamide peroxide (DEBROX) 6.5 % OTIC solution, Place 5 drops into the right ear 2 (two) times daily., Disp: 15 mL, Rfl: 0   cholecalciferol (VITAMIN D) 1000 units tablet, Take 1 tablet (1,000 Units total) by mouth daily., Disp: , Rfl:    docusate sodium (COLACE) 100 MG capsule, Take 100 mg by mouth daily., Disp: , Rfl:    hydrochlorothiazide (MICROZIDE) 12.5 MG capsule, Take 1 capsule (12.5 mg total) by mouth daily., Disp: 90 capsule, Rfl: 1   losartan (COZAAR) 100 MG tablet, Take 1 tablet (100  mg total) by mouth daily., Disp: 90 tablet, Rfl: 1   magnesium oxide (MAG-OX) 400 MG tablet, Take 400 mg by mouth daily., Disp: , Rfl:    metoprolol succinate (TOPROL-XL) 100 MG 24 hr tablet, Take 1 tablet (100 mg total) by mouth daily. Take with or immediately following a meal., Disp: 90 tablet, Rfl: 1   Multiple Vitamins-Minerals (MULTIVITAMIN PO), Take 1 tablet by mouth daily., Disp: , Rfl:    simvastatin (ZOCOR) 10 MG tablet, Take 1 tablet (10 mg total) by mouth daily., Disp: 90 tablet, Rfl: 1   tiZANidine (ZANAFLEX) 4 MG tablet, Take 1 tablet (4 mg total) by mouth every 8 (eight) hours as needed for muscle spasms., Disp: 90 tablet, Rfl: 0   fluticasone (FLONASE) 50 MCG/ACT nasal spray, Place 2 sprays into both nostrils daily. (Patient not taking: Reported on 08/12/2019), Disp: 16 g, Rfl: 6   mirabegron ER (MYRBETRIQ) 25 MG TB24 tablet, Take 1 tablet (25 mg total) by mouth daily., Disp: 30 tablet, Rfl: 11   naproxen sodium (ALEVE) 220 MG tablet, Take 1 tablet (220 mg total) by mouth daily as needed. (Patient not taking: Reported on 08/12/2019), Disp: , Rfl:   Allergies  Allergen Reactions   Codeine Itching and Other (See Comments)    nightmare   Oxycodone Itching    I personally reviewed active problem list, medication list, allergies, health maintenance, notes from last encounter, lab results with the patient/caregiver today.   ROS  Constitutional: Negative for fever or weight change.  Respiratory: Negative for cough and shortness of breath.   Cardiovascular: Negative for chest pain or palpitations.  Gastrointestinal: Negative for abdominal pain, no bowel changes.  Musculoskeletal: Negative for gait problem or joint swelling.  Skin: Negative for rash.  Neurological: Negative for dizziness or headache.  No other specific complaints in a complete review of systems (except as listed in HPI above).  Objective  Vitals:   08/12/19 0940  BP: 132/74  Pulse: 76  Resp: 16    Temp: 97.6 F (36.4 C)  TempSrc: Oral  SpO2: 97%  Weight: 144 lb (65.3 kg)  Height: 5\' 1"  (1.549  m)    Body mass index is 27.21 kg/m.  Physical Exam Constitutional: Patient appears well-developed and well-nourished. No distress.  HENT: Head: Normocephalic and atraumatic. Ears: bilateral TMs with no erythema or effusion, minimal cerumen present; Nose: Nose normal. Mouth/Throat: Oropharynx is clear and moist. No oropharyngeal exudate or tonsillar swelling.  Eyes: Conjunctivae and EOM are normal. No scleral icterus.   Neck: Normal range of motion. Neck supple. No JVD present. No thyromegaly present.  Cardiovascular: Normal rate, regular rhythm and normal heart sounds.  No murmur heard. No BLE edema. Pulmonary/Chest: Effort normal and breath sounds normal. No respiratory distress. Musculoskeletal: Normal range of motion, no joint effusions. No gross deformities. Normal popliteal fossas bilaterally Neurological: Pt is alert and oriented to person, place, and time. No cranial nerve deficit. Coordination, balance, strength, speech and gait are normal.  Skin: Skin is warm and dry. No rash noted. No erythema.  Psychiatric: Patient has a normal mood and affect. behavior is normal. Judgment and thought content normal.  No results found for this or any previous visit (from the past 72 hour(s)).  PHQ2/9: Depression screen [facility] 2/9 08/12/2019 06/04/2019 10/26/2018 10/10/2018 09/27/2018  Decreased Interest 0 0 0 0 0  Down, Depressed, Hopeless 0 0 0 0 0  PHQ - 2 Score 0 0 0 0 0  Altered sleeping 0 0 - - 0  Tired, decreased energy 0 0 - - 0  Change in appetite 0 0 - - 0  Feeling bad or failure about yourself  0 0 - - 0  Trouble concentrating 0 0 - - 0  Moving slowly or fidgety/restless 0 0 - - 0  Suicidal thoughts 0 0 - - 0  PHQ-9 Score 0 0 - - 0  Difficult doing work/chores Not difficult at all Not difficult at all - - Not difficult at all   PHQ-2/9 Result is negative.    Fall Risk: Fall  Risk  08/12/2019 06/04/2019 04/10/2019 10/10/2018 09/27/2018  Falls in the past year? 0 0 0 No No  Number falls in past yr: 0 0 0 - -  Injury with Fall? 0 0 0 - -  Risk for fall due to : - - - - Impaired vision  Risk for fall due to: Comment - - - - wears eyeglasses  Follow up Falls evaluation completed Falls evaluation completed - - -    Assessment & Plan  1. Chronic jaw pain - Improving without eval from specialty at this time  2. Dysfunction of Eustachian tube, left - No lavage, encouraged her to use nasal spray and take OTC claritin.  If not improving in about 3-4 weeks, she will let me know and will refer to ENT.  3. Allergic rhinitis due to other allergic trigger, unspecified seasonality - Flonase, OTC Claritin  4. Cervical radiculitis - Tizanidine PRN, home exercises.  5. Primary osteoarthritis involving multiple joints - meloxicam (MOBIC) 7.5 MG tablet; Take 1 tablet (7.5 mg total) by mouth daily.  Dispense: 30 tablet; Refill: 0  6. Osteopenia, unspecified location - Taking vitamin D supplementation  7. Hypertension goal BP (blood pressure) < [id number] - BASIC METABOLIC PANEL WITH GFR  8. CKD (chronic kidney disease) stage 2, GFR 60-89 ml/min - BASIC METABOLIC PANEL WITH GFR  9. Urinary frequency - Seeing urology  10. Hyperlipidemia LDL goal <100 - Taking simvastatin, continue this regimen  11. Gastroesophageal reflux disease without esophagitis - Diet controlled  12. Hyponatremia - BMP per orders  13. Flu vaccine need - Flu  vaccine HIGH DOSE PF

## 2019-08-13 ENCOUNTER — Encounter: Payer: Self-pay | Admitting: Family Medicine

## 2019-08-19 ENCOUNTER — Ambulatory Visit: Payer: Medicare HMO | Admitting: Urology

## 2019-08-21 DIAGNOSIS — M47816 Spondylosis without myelopathy or radiculopathy, lumbar region: Secondary | ICD-10-CM | POA: Diagnosis not present

## 2019-08-21 DIAGNOSIS — M48062 Spinal stenosis, lumbar region with neurogenic claudication: Secondary | ICD-10-CM | POA: Diagnosis not present

## 2019-08-21 DIAGNOSIS — M5136 Other intervertebral disc degeneration, lumbar region: Secondary | ICD-10-CM | POA: Diagnosis not present

## 2019-08-21 DIAGNOSIS — M1612 Unilateral primary osteoarthritis, left hip: Secondary | ICD-10-CM | POA: Diagnosis not present

## 2019-08-21 DIAGNOSIS — M5416 Radiculopathy, lumbar region: Secondary | ICD-10-CM | POA: Diagnosis not present

## 2019-09-04 DIAGNOSIS — M48062 Spinal stenosis, lumbar region with neurogenic claudication: Secondary | ICD-10-CM | POA: Diagnosis not present

## 2019-09-04 DIAGNOSIS — M5416 Radiculopathy, lumbar region: Secondary | ICD-10-CM | POA: Diagnosis not present

## 2019-09-04 DIAGNOSIS — M5136 Other intervertebral disc degeneration, lumbar region: Secondary | ICD-10-CM | POA: Diagnosis not present

## 2019-09-05 ENCOUNTER — Other Ambulatory Visit: Payer: Self-pay | Admitting: Family Medicine

## 2019-09-05 DIAGNOSIS — M159 Polyosteoarthritis, unspecified: Secondary | ICD-10-CM

## 2019-09-05 NOTE — Telephone Encounter (Signed)
Requested medication (s) are due for refill today: yes  Requested medication (s) are on the active medication list: yes  Last refill:  08/12/2019  Future visit scheduled: no  Notes to clinic:  Review for refill  Requested Prescriptions  Pending Prescriptions Disp Refills   meloxicam (MOBIC) 7.5 MG tablet [Pharmacy Med Name: MELOXICAM 7.5 MG TABLET] 30 tablet 0    Sig: TAKE 1 TABLET BY MOUTH EVERY DAY     Analgesics:  COX2 Inhibitors Failed - 09/05/2019  2:28 AM      Failed - HGB in normal range and within 360 days    Hemoglobin  Date Value Ref Range Status  08/03/2018 12.2 12.0 - 15.0 g/dL Final  08/23/2016 12.1 11.1 - 15.9 g/dL Final         Passed - Cr in normal range and within 360 days    Creat  Date Value Ref Range Status  08/12/2019 0.89 0.60 - 0.93 mg/dL Final    Comment:    For patients >62 years of age, the reference limit for Creatinine is approximately 13% higher for people identified as African-American. Renella Cunas - Patient is not pregnant      Passed - Valid encounter within last 12 months    Recent Outpatient Visits          3 weeks ago Chronic jaw pain   [place], [employer], FNP   3 months ago Chronic jaw pain   [facility] [place], Raquel Sarna E, FNP   4 months ago CKD (chronic kidney disease) stage 2, GFR 60-89 ml/min   [facility] [place], Satira Anis, MD   11 months ago Hypertension goal BP (blood pressure) < 140/90   [facility] [place], Satira Anis, MD   1 year ago Hypertension goal BP (blood pressure) < 140/90   [facility] [place], Satira Anis, MD      Future Appointments            In 2 weeks MacDiarmid, Nicki Reaper, MD [place]   In 3 weeks  [facility], [facility]

## 2019-09-06 DIAGNOSIS — M1711 Unilateral primary osteoarthritis, right knee: Secondary | ICD-10-CM | POA: Diagnosis not present

## 2019-09-23 ENCOUNTER — Encounter: Payer: Self-pay | Admitting: Family Medicine

## 2019-09-23 ENCOUNTER — Other Ambulatory Visit: Payer: Self-pay

## 2019-09-23 ENCOUNTER — Ambulatory Visit: Payer: Medicare HMO | Admitting: Urology

## 2019-09-23 ENCOUNTER — Encounter: Payer: Self-pay | Admitting: Urology

## 2019-09-23 VITALS — BP 153/87 | HR 76 | Ht 61.0 in | Wt 137.0 lb

## 2019-09-23 DIAGNOSIS — N3941 Urge incontinence: Secondary | ICD-10-CM

## 2019-09-23 MED ORDER — OXYBUTYNIN CHLORIDE ER 10 MG PO TB24: 10.0000 mg | ORAL_TABLET | Freq: Every day | ORAL | 11 refills | Status: DC

## 2019-09-23 MED ORDER — TIZANIDINE HCL 4 MG PO TABS: 4.0000 mg | ORAL_TABLET | Freq: Three times a day (TID) | ORAL | 0 refills | Status: DC | PRN

## 2019-09-23 MED ORDER — TOLTERODINE TARTRATE ER 4 MG PO CP24: 4.0000 mg | ORAL_CAPSULE | Freq: Every day | ORAL | 11 refills | Status: DC

## 2019-09-23 NOTE — Addendum Note (Signed)
Addended by: Tommy Rainwater on: 09/23/2019 11:17 AM   Modules accepted: Orders

## 2019-09-23 NOTE — Progress Notes (Signed)
09/23/2019 11:15 AM   Rachel James 10-11-1946 [id number]  Referring provider: Arnetha Courser, MD [address] [place],  [place] [address]  Chief Complaint  Patient presents with  . Follow-up    HPI: I was consulted to assist the patient's urgent bladder.  When she drinks a lot of water when she goes from a sitting to standing position she has urgency and has to run.  She has mild but not daily urge incontinence.  She might leak it with a cough or sneeze that she was full.  She denies bedwetting.  She wears 1 light pad per day  She voids every 1 or 2 hours and has no nocturia   Well supported bladder neck and no stress incontinence  Patient has mild mixed incontinence but primarily has urgency triggered from a sitting to standing position.    Today Frequency is stable.  Urgency stable.  No benefit.  Oxybutynin ER 10 mg and Detrol LA 4 mg prescriptions given and see in 8 weeks   PMH: Past Medical History:  Diagnosis Date  . Allergy   . Arthritis    knees, lower back  . CKD (chronic kidney disease) stage 2, GFR 60-89 ml/min 07/11/2017  . Degenerative disc disease, lumbar   . Hypertension   . Osteopenia 06/22/2017   June 2016 DEXA  . PONV (postoperative nausea and vomiting)    in past.  none recently    Surgical History: Past Surgical History:  Procedure Laterality Date  .  THUMB SURGERY Left   . APPENDECTOMY    . BREAST BIOPSY Right 1992   neg  . BREAST LUMPECTOMY Right   . BUNIONECTOMY Bilateral   . CERVICAL DISCECTOMY  09/18/2012   C4-C5 AND C6-C7 ACDF   . SHOULDER ARTHROSCOPY WITH ROTATOR CUFF REPAIR AND OPEN BICEPS TENODESIS Right 12/20/2017   Procedure: SHOULDER ARTHROSCOPY WITH DEBRIDEMENT, DECOMPRESSION AND  BICEPS TENODESIS;  Surgeon: Corky Mull, MD;  Location: [facility];  Service: Orthopedics;  Laterality: Right;  . TOTAL SHOULDER ARTHROPLASTY Right 08/09/2018   Procedure: RIGHT TOTAL SHOULDER ARTHROPLASTY;  Surgeon: Justice Britain,  MD;  Location: [facility];  Service: Orthopedics;  Laterality: Right;  . TUBAL LIGATION Bilateral     Home Medications:  Allergies as of 09/23/2019      Reactions   Codeine Itching, Other (See Comments)   nightmare   Oxycodone Itching      Medication List       Accurate as of September 23, 2019 11:15 AM. If you have any questions, ask your nurse or doctor.        STOP taking these medications   Debrox 6.5 % OTIC solution Generic drug: carbamide peroxide Stopped by: Reece Packer, MD   meloxicam 7.5 MG tablet Commonly known as: MOBIC Stopped by: Reece Packer, MD     TAKE these medications   acetaminophen 500 MG tablet Commonly known as: TYLENOL Take 1,000 mg by mouth 2 (two) times daily.   aspirin 81 MG tablet Take 81 mg by mouth daily.   cholecalciferol 1000 units tablet Commonly known as: VITAMIN D Take 1 tablet (1,000 Units total) by mouth daily.   docusate sodium 100 MG capsule Commonly known as: COLACE Take 100 mg by mouth daily.   hydrochlorothiazide 12.5 MG capsule Commonly known as: MICROZIDE Take 1 capsule (12.5 mg total) by mouth daily.   losartan 100 MG tablet Commonly known as: COZAAR Take 1 tablet (100 mg total) by mouth  daily.   magnesium oxide 400 MG tablet Commonly known as: MAG-OX Take 400 mg by mouth daily.   metoprolol succinate 100 MG 24 hr tablet Commonly known as: TOPROL-XL Take 1 tablet (100 mg total) by mouth daily. Take with or immediately following a meal.   MULTIVITAMIN PO Take 1 tablet by mouth daily.   simvastatin 10 MG tablet Commonly known as: ZOCOR Take 1 tablet (10 mg total) by mouth daily.   tiZANidine 4 MG tablet Commonly known as: Zanaflex Take 1 tablet (4 mg total) by mouth every 8 (eight) hours as needed for muscle spasms.       Allergies:  Allergies  Allergen Reactions  . Codeine Itching and Other (See Comments)    nightmare  . Oxycodone Itching    Family History: Family History  Problem  Relation Age of Onset  . Heart disease Mother   . Heart attack Mother   . Heart disease Father   . Heart disease Sister   . Heart disease Brother   . Heart failure Brother   . Memory loss Brother   . Diabetes Paternal Grandmother   . Pulmonary embolism Sister   . Breast cancer Neg Hx     Social History:  reports that she has never smoked. She has never used smokeless tobacco. She reports previous alcohol use. She reports that she does not use drugs.  ROS: UROLOGY Frequent Urination?: Yes Hard to postpone urination?: Yes Burning/pain with urination?: No Get up at night to urinate?: No Leakage of urine?: No Urine stream starts and stops?: No Trouble starting stream?: No Do you have to strain to urinate?: No Blood in urine?: No Urinary tract infection?: No Sexually transmitted disease?: No Injury to kidneys or bladder?: No Painful intercourse?: No Weak stream?: No Currently pregnant?: No Vaginal bleeding?: No Last menstrual period?: n  Gastrointestinal Nausea?: No Vomiting?: No Indigestion/heartburn?: No Diarrhea?: No Constipation?: No  Constitutional Fever: No Night sweats?: No Weight loss?: No Fatigue?: No  Skin Skin rash/lesions?: No Itching?: No  Eyes Blurred vision?: No Double vision?: No  Ears/Nose/Throat Sore throat?: No Sinus problems?: No  Hematologic/Lymphatic Swollen glands?: No Easy bruising?: No  Cardiovascular Leg swelling?: No Chest pain?: No  Respiratory Cough?: No Shortness of breath?: No  Endocrine Excessive thirst?: No  Musculoskeletal Back pain?: No Joint pain?: No  Neurological Headaches?: No Dizziness?: No  Psychologic Depression?: No Anxiety?: No  Physical Exam: BP (!) 153/87   Pulse 76   Ht 5\' 1"  (1.549 m)   Wt 62.1 kg   BMI 25.89 kg/m     Laboratory Data: Lab Results  Component Value Date   WBC 6.3 08/03/2018   HGB 12.2 08/03/2018   HCT 37.4 08/03/2018   MCV 93.5 08/03/2018   PLT 226  08/03/2018    Lab Results  Component Value Date   CREATININE 0.89 08/12/2019    No results found for: PSA  No results found for: TESTOSTERONE  No results found for: HGBA1C  Urinalysis    Component Value Date/Time   COLORURINE YELLOW 04/17/2019 0854   APPEARANCEUR Clear 07/01/2019 1424   LABSPEC 1.006 04/17/2019 0854   PHURINE 8.0 04/17/2019 0854   GLUCOSEU Negative 07/01/2019 1424   HGBUR NEGATIVE 04/17/2019 0854   BILIRUBINUR Negative 07/01/2019 [place] 04/17/2019 0854   PROTEINUR Negative 07/01/2019 1424   PROTEINUR NEGATIVE 04/17/2019 0854   NITRITE Negative 07/01/2019 1424   LEUKOCYTESUR Negative 07/01/2019 1424    Pertinent Imaging:   Assessment & Plan:  Reassess 8 weeks  There are no diagnoses linked to this encounter.  No follow-ups on file.  Reece Packer, MD  [facility] [address] [place], [place] [address] [phone]

## 2019-09-25 ENCOUNTER — Other Ambulatory Visit: Payer: Self-pay | Admitting: Family Medicine

## 2019-09-25 DIAGNOSIS — M5416 Radiculopathy, lumbar region: Secondary | ICD-10-CM | POA: Diagnosis not present

## 2019-09-25 DIAGNOSIS — M1612 Unilateral primary osteoarthritis, left hip: Secondary | ICD-10-CM | POA: Diagnosis not present

## 2019-09-25 DIAGNOSIS — M48062 Spinal stenosis, lumbar region with neurogenic claudication: Secondary | ICD-10-CM | POA: Diagnosis not present

## 2019-09-25 DIAGNOSIS — M5136 Other intervertebral disc degeneration, lumbar region: Secondary | ICD-10-CM | POA: Diagnosis not present

## 2019-09-25 DIAGNOSIS — Z1231 Encounter for screening mammogram for malignant neoplasm of breast: Secondary | ICD-10-CM

## 2019-09-25 DIAGNOSIS — M47816 Spondylosis without myelopathy or radiculopathy, lumbar region: Secondary | ICD-10-CM | POA: Diagnosis not present

## 2019-09-28 ENCOUNTER — Encounter: Payer: Self-pay | Admitting: Family Medicine

## 2019-09-30 NOTE — Telephone Encounter (Signed)
Pt contacted via phone and confirmed telephone appt.

## 2019-10-01 ENCOUNTER — Ambulatory Visit: Payer: Medicare HMO | Admitting: Family Medicine

## 2019-10-01 ENCOUNTER — Telehealth: Payer: Self-pay

## 2019-10-01 ENCOUNTER — Ambulatory Visit (INDEPENDENT_AMBULATORY_CARE_PROVIDER_SITE_OTHER): Payer: Medicare HMO

## 2019-10-01 VITALS — BP 109/75 | HR 68 | Ht 61.0 in | Wt 137.0 lb

## 2019-10-01 DIAGNOSIS — M8580 Other specified disorders of bone density and structure, unspecified site: Secondary | ICD-10-CM

## 2019-10-01 DIAGNOSIS — Z780 Asymptomatic menopausal state: Secondary | ICD-10-CM

## 2019-10-01 DIAGNOSIS — Z0000 Encounter for general adult medical examination without abnormal findings: Secondary | ICD-10-CM | POA: Diagnosis not present

## 2019-10-01 NOTE — Telephone Encounter (Signed)
Prior auth received from patient's pharmacy for Detrol LA. Per Dr. Mikle Bosworth note patient was to try both detrol and oxybutinin. Patient was contacted to see if she filled the oxybutinin and if the medication was working or if we should proceed with prior auth of Detrol. Patient states she has a $47 copy with both medications and states she read the side effects and is not interested in taking either medication due to possible side effects

## 2019-10-01 NOTE — Progress Notes (Signed)
Subjective:   Rachel James is a 73 y.o. female who presents for Medicare Annual (Subsequent) preventive examination.   Virtual Visit via Telephone Note  I connected with [name] on 10/01/19 at 10:40 AM EDT by telephone and verified that I am speaking with the correct person using two identifiers.  Medicare Annual Wellness visit completed telephonically due to Covid-19 pandemic.   Location: Patient: home Provider: office   I discussed the limitations, risks, security and privacy concerns of performing an evaluation and management service by telephone and the availability of in person appointments. The patient expressed understanding and agreed to proceed.  Some vital signs may be absent or patient reported.   Clemetine Marker, LPN    Review of Systems:   Cardiac Risk Factors include: advanced age (>3men, >47 women);hypertension;dyslipidemia     Objective:     Vitals: BP 109/75   Pulse 68   Ht 5\' 1"  (1.549 m)   Wt 137 lb (62.1 kg)   BMI 25.89 kg/m   Body mass index is 25.89 kg/m.  Advanced Directives 10/01/2019 10/26/2018 09/27/2018 08/10/2018 08/10/2018 08/03/2018 12/20/2017  Does Patient Have a Medical Advance Directive? No No No;Yes - No No No  Type of Advance Directive - Public librarian;Living will - - - -  Does patient want to make changes to medical advance directive? - - - - - - -  Copy of [place] in Chart? - - No - copy requested - - - -  Would patient like information on creating a medical advance directive? No - Patient declined No - Patient declined - Yes (MAU/Ambulatory/Procedural Areas - Information given) - Yes (MAU/Ambulatory/Procedural Areas - Information given) Yes (MAU/Ambulatory/Procedural Areas - Information given)    Tobacco Social History   Tobacco Use  Smoking Status Never Smoker  Smokeless Tobacco Never Used  Tobacco Comment   smoking cessation materials not required     Counseling given: Not  Answered Comment: smoking cessation materials not required   Clinical Intake:  Pre-visit preparation completed: Yes  Pain : No/denies pain     BMI - recorded: 25.89 Nutritional Status: BMI 25 -29 Overweight Nutritional Risks: None Diabetes: No  How often do you need to have someone help you when you read instructions, pamphlets, or other written materials from your doctor or pharmacy?: 1 - Never  Interpreter Needed?: No  Information entered by :: Clemetine Marker LPN  Past Medical History:  Diagnosis Date  . Allergy   . Arthritis    knees, lower back  . Cervical radiculitis   . Chronic radicular low back pain   . CKD (chronic kidney disease) stage 2, GFR 60-89 ml/min 07/11/2017  . Degenerative disc disease, lumbar   . Hyperlipidemia LDL goal <100   . Hypertension   . Osteopenia 06/22/2017   June 2016 DEXA  . PONV (postoperative nausea and vomiting)    in past.  none recently   Past Surgical History:  Procedure Laterality Date  .  THUMB SURGERY Left   . APPENDECTOMY    . BREAST BIOPSY Right 1992   neg  . BREAST LUMPECTOMY Right   . BUNIONECTOMY Bilateral   . CERVICAL DISCECTOMY  09/18/2012   C4-C5 AND C6-C7 ACDF   . JOINT REPLACEMENT  2008, 2019   thumb, shoulder  . SHOULDER ARTHROSCOPY WITH ROTATOR CUFF REPAIR AND OPEN BICEPS TENODESIS Right 12/20/2017   Procedure: SHOULDER ARTHROSCOPY WITH DEBRIDEMENT, DECOMPRESSION AND  BICEPS TENODESIS;  Surgeon: Milagros Evener  J, MD;  Location: [facility];  Service: Orthopedics;  Laterality: Right;  . SPINE SURGERY  2013   cervical  . TOTAL SHOULDER ARTHROPLASTY Right 08/09/2018   Procedure: RIGHT TOTAL SHOULDER ARTHROPLASTY;  Surgeon: Justice Britain, MD;  Location: [facility];  Service: Orthopedics;  Laterality: Right;  . TUBAL LIGATION Bilateral    Family History  Problem Relation Age of Onset  . Heart disease Mother   . Heart attack Mother   . Arthritis Mother   . Heart disease Father   . Heart disease Sister   . Arthritis  Sister   . Heart disease Brother   . Heart failure Brother   . Memory loss Brother   . Arthritis Maternal Grandmother   . Diabetes Paternal Grandmother   . Pulmonary embolism Sister   . Breast cancer Neg Hx    Social History   Socioeconomic History  . Marital status: Divorced    Spouse name: Not on file  . Number of children: 3  . Years of education: Not on file  . Highest education level: Associate degree: occupational, Hotel manager, or vocational program  Occupational History  . Occupation: retired  Scientific laboratory technician  . Financial resource strain: Not hard at all  . Food insecurity    Worry: Never true    Inability: Never true  . Transportation needs    Medical: No    Non-medical: No  Tobacco Use  . Smoking status: Never Smoker  . Smokeless tobacco: Never Used  . Tobacco comment: smoking cessation materials not required  Substance and Sexual Activity  . Alcohol use: Not Currently    Comment: alcohol raises BP  . Drug use: No  . Sexual activity: Not Currently  Lifestyle  . Physical activity    Days per week: 0 days    Minutes per session: 0 min  . Stress: Not at all  Relationships  . Social Herbalist on phone: Patient refused    Gets together: Patient refused    Attends religious service: Patient refused    Active member of club or organization: Patient refused    Attends meetings of clubs or organizations: Patient refused    Relationship status: Divorced  Other Topics Concern  . Not on file  Social History Narrative  . Not on file    Outpatient Encounter Medications as of 10/01/2019  Medication Sig  . acetaminophen (TYLENOL) 500 MG tablet Take 1,000 mg by mouth 2 (two) times daily.  Marland Kitchen aspirin 81 MG tablet Take 81 mg by mouth daily.   . cholecalciferol (VITAMIN D) 1000 units tablet Take 1 tablet (1,000 Units total) by mouth daily.  Marland Kitchen docusate sodium (COLACE) 100 MG capsule Take 100 mg by mouth daily.  . hydrochlorothiazide (MICROZIDE) 12.5 MG capsule  Take 1 capsule (12.5 mg total) by mouth daily.  Marland Kitchen losartan (COZAAR) 100 MG tablet Take 1 tablet (100 mg total) by mouth daily.  . magnesium oxide (MAG-OX) 400 MG tablet Take 400 mg by mouth daily.  . metoprolol succinate (TOPROL-XL) 100 MG 24 hr tablet Take 1 tablet (100 mg total) by mouth daily. Take with or immediately following a meal.  . Multiple Vitamins-Minerals (MULTIVITAMIN PO) Take 1 tablet by mouth daily.  . simvastatin (ZOCOR) 10 MG tablet Take 1 tablet (10 mg total) by mouth daily.  Marland Kitchen tiZANidine (ZANAFLEX) 4 MG tablet Take 1 tablet (4 mg total) by mouth every 8 (eight) hours as needed for muscle spasms.  . [DISCONTINUED] oxybutynin (DITROPAN-XL) 10  MG 24 hr tablet Take 1 tablet (10 mg total) by mouth daily.  . [DISCONTINUED] tolterodine (DETROL LA) 4 MG 24 hr capsule Take 1 capsule (4 mg total) by mouth daily.   No facility-administered encounter medications on file as of 10/01/2019.     Activities of Daily Living In your present state of health, do you have any difficulty performing the following activities: 10/01/2019 04/10/2019  Hearing? Y N  Comment declines hearing aids -  Vision? N Y  Difficulty concentrating or making decisions? N N  Walking or climbing stairs? N N  Dressing or bathing? N N  Doing errands, shopping? N N  Preparing Food and eating ? N -  Using the Toilet? N -  In the past six months, have you accidently leaked urine? Y -  Comment wears liners for protection -  Do you have problems with loss of bowel control? N -  Managing your Medications? N -  Managing your Finances? N -  Housekeeping or managing your Housekeeping? N -  Some recent data might be hidden    Patient Care Team: Hubbard Hartshorn, FNP as PCP - General (Family Medicine) Beverly Gust, MD as Consulting Physician (Otolaryngology) Justice Britain, MD as Consulting Physician (Orthopedic Surgery)    Assessment:   This is a routine wellness examination for Jaselyn.  Exercise Activities  and Dietary recommendations Current Exercise Habits: The patient does not participate in regular exercise at present, Exercise limited by: orthopedic condition(s)  Goals    . DIET - EAT MORE FRUITS AND VEGETABLES     Recommend to eat 3 small healthy meals and at least 2 healthy snacks per day.    Marland Kitchen DIET - REDUCE SUGAR INTAKE (pt-stated)    . Increase physical activity     Increase physical activity as tolerated once knee replacement therapy complete       Fall Risk Fall Risk  10/01/2019 08/12/2019 06/04/2019 04/10/2019 10/10/2018  Falls in the past year? 0 0 0 0 No  Number falls in past yr: 0 0 0 0 -  Injury with Fall? 0 0 0 0 -  Risk for fall due to : - - - - -  Risk for fall due to: Comment - - - - -  Follow up Falls prevention discussed Falls evaluation completed Falls evaluation completed - -   FALL RISK PREVENTION PERTAINING TO THE HOME:  Any stairs in or around the home? Yes  If so, do they handrails? Yes   Home free of loose throw rugs in walkways, pet beds, electrical cords, etc? Yes  Adequate lighting in your home to reduce risk of falls? Yes   ASSISTIVE DEVICES UTILIZED TO PREVENT FALLS:  Life alert? No  Use of a cane, walker or w/c? No  Grab bars in the bathroom? Yes  Shower chair or bench in shower? No  Elevated toilet seat or a handicapped toilet? No   DME ORDERS:  DME order needed?  No   TIMED UP AND GO:  Was the test performed? No . Telephonic visit.   Education: Fall risk prevention has been discussed.  Intervention(s) required? No   Depression Screen PHQ 2/9 Scores 10/01/2019 08/12/2019 06/04/2019 10/26/2018  PHQ - 2 Score 0 0 0 0  PHQ- 9 Score - 0 0 -     Cognitive Function     6CIT Screen 10/01/2019 09/27/2018  What Year? 0 points 0 points  What month? 0 points 0 points  What time? 0 points 0 points  Count back from 20 0 points 0 points  Months in reverse 0 points 0 points  Repeat phrase 0 points 0 points  Total Score 0 0     Immunization History  Administered Date(s) Administered  . Fluad Quad(high Dose 65+) 08/12/2019  . Influenza, High Dose Seasonal PF 09/17/2015, 08/16/2016, 09/26/2017, 09/27/2018  . Influenza,inj,quad, With Preservative 09/18/2017, 09/18/2018  . Pneumococcal Conjugate-13 04/21/2015  . Pneumococcal Polysaccharide-23 08/16/2016  . Tdap 07/12/2014  . Zoster Recombinat (Shingrix) 12/15/2018    Qualifies for Shingles Vaccine? Yes  . Due for second dose of Shingrix.   Tdap: Up to date  Flu Vaccine: Up to date  Pneumococcal Vaccine: Up to date  Screening Tests Health Maintenance  Topic Date Due  . MAMMOGRAM  08/08/2019  . DEXA SCAN  10/27/2019  . COLONOSCOPY  01/19/2022  . TETANUS/TDAP  07/12/2024  . INFLUENZA VACCINE  Completed  . Hepatitis C Screening  Completed  . PNA vac Low Risk Adult  Completed    Cancer Screenings:  Colorectal Screening: Completed 01/20/12. Repeat every 10 years;  Mammogram: Completed 08/07/18. Repeat every year; Scheduled for 10/02/19  Bone Density: Completed 10/26/17. Results reflect OSTEOPENIA. Repeat every 2 years. Ordered today. Pt provided with contact information and advised to call to schedule appt.   Lung Cancer Screening: (Low Dose CT Chest recommended if Age 72-80 years, 30 pack-year currently smoking OR have quit w/in 15years.) does not qualify.   Additional Screening:  Hepatitis C Screening: does qualify; Completed 08/23/16.  Vision Screening: Recommended annual ophthalmology exams for early detection of glaucoma and other disorders of the eye. Is the patient up to date with their annual eye exam?  Yes  Who is the provider or what is the name of the office in which the pt attends annual eye exams? Dr. Ellin Mayhew  Dental Screening: Recommended annual dental exams for proper oral hygiene  Community Resource Referral:  CRR required this visit?  No      Plan:     I have personally reviewed and addressed the Medicare Annual Wellness  questionnaire and have noted the following in the patient's chart:  A. Medical and social history B. Use of alcohol, tobacco or illicit drugs  C. Current medications and supplements D. Functional ability and status E.  Nutritional status F.  Physical activity G. Advance directives H. List of other physicians I.  Hospitalizations, surgeries, and ER visits in previous 12 months J.  JAARS such as hearing and vision if needed, cognitive and depression L. Referrals and appointments   In addition, I have reviewed and discussed with patient certain preventive protocols, quality metrics, and best practice recommendations. A written personalized care plan for preventive services as well as general preventive health recommendations were provided to patient.   Signed,  Clemetine Marker, LPN Nurse Health Advisor   Nurse Notes: pt doing well and appreciative of visit today. She is awaiting upcoming knee replacement surgery.

## 2019-10-02 ENCOUNTER — Ambulatory Visit
Admission: RE | Admit: 2019-10-02 | Discharge: 2019-10-02 | Disposition: A | Discharge status: Home / Self Care | Payer: Medicare HMO | Source: Ambulatory Visit | Attending: Diagnostic Radiology | Admitting: Diagnostic Radiology

## 2019-10-02 ENCOUNTER — Other Ambulatory Visit: Payer: Self-pay

## 2019-10-02 DIAGNOSIS — Z1231 Encounter for screening mammogram for malignant neoplasm of breast: Secondary | ICD-10-CM | POA: Diagnosis not present

## 2019-10-17 ENCOUNTER — Other Ambulatory Visit: Payer: Self-pay | Admitting: Family Medicine

## 2019-10-17 NOTE — Telephone Encounter (Signed)
Requested medication (s) are due for refill today: yes  Requested medication (s) are on the active medication list: yes  Last refill:  09/23/2019  Future visit scheduled: yes   Notes to clinic: refill cannot be delegated   Requested Prescriptions  Pending Prescriptions Disp Refills   tiZANidine (ZANAFLEX) 4 MG tablet [Pharmacy Med Name: TIZANIDINE HCL 4 MG TABLET] 90 tablet 0    Sig: Take 1 tablet (4 mg total) by mouth every 8 (eight) hours as needed for muscle spasms.     Not Delegated - Cardiovascular:  Alpha-2 Agonists - tizanidine Failed - 10/17/2019 11:33 AM      Failed - This refill cannot be delegated      Passed - Valid encounter within last 6 months    Recent Outpatient Visits          2 months ago Chronic jaw pain   [facility], FNP   4 months ago Chronic jaw pain   [facility] [place], Raquel Sarna E, FNP   6 months ago CKD (chronic kidney disease) stage 2, GFR 60-89 ml/min   [place], Satira Anis, MD   1 year ago Hypertension goal BP (blood pressure) < 140/90   [facility] [place], Satira Anis, MD   1 year ago Hypertension goal BP (blood pressure) < 140/90   [facility] [place], Satira Anis, MD      Future Appointments            In 1 month [place], Nicki Reaper, MD [place]   In 21 months  [facility], [facility]

## 2019-10-20 NOTE — Discharge Instructions (Signed)
Instructions after Total Knee Replacement   Tsion Inghram P. Analiese Krupka, Jr., M.D.     Dept. of Orthopaedics & Sports Medicine  [facility]  [address]  [place], [place]  [address]  Phone: [phone]   Fax: [phone]    DIET: Drink plenty of non-alcoholic fluids. Resume your normal diet. Include foods high in fiber.  ACTIVITY:  You may use crutches or a walker with weight-bearing as tolerated, unless instructed otherwise. You may be weaned off of the walker or crutches by your Physical Therapist.  Do NOT place pillows under the knee. Anything placed under the knee could limit your ability to straighten the knee.   Continue doing gentle exercises. Exercising will reduce the pain and swelling, increase motion, and prevent muscle weakness.   Please continue to use the TED compression stockings for 6 weeks. You may remove the stockings at night, but should reapply them in the morning. Do not drive or operate any equipment until instructed.  WOUND CARE:  Continue to use the PolarCare or ice packs periodically to reduce pain and swelling. You may bathe or shower after the staples are removed at the first office visit following surgery.  MEDICATIONS: You may resume your regular medications. Please take the pain medication as prescribed on the medication. Do not take pain medication on an empty stomach. You have been given a prescription for a blood thinner (Lovenox or Coumadin). Please take the medication as instructed. (NOTE: After completing a 2 week course of Lovenox, take one Enteric-coated aspirin once a day. This along with elevation will help reduce the possibility of phlebitis in your operated leg.) Do not drive or drink alcoholic beverages when taking pain medications.  CALL THE OFFICE FOR: Temperature above 101 degrees Excessive bleeding or drainage on the dressing. Excessive swelling, coldness, or paleness of the toes. Persistent nausea and vomiting.  FOLLOW-UP:  You  should have an appointment to return to the office in 10-14 days after surgery. Arrangements have been made for continuation of Physical Therapy (either home therapy or outpatient therapy).   [facility] Department Directory         [email or web address]       [email or web address]          Cardiology  Appointments: [place] - [phone] [place] - [phone]  Endocrinology  Appointments: [place] - [phone] [place] - [phone]  Gastroenterology  Appointments: [place] - [phone] [place] - [phone]        General Surgery   Appointments: [place] - [phone]  Internal Medicine/Family Medicine  Appointments: [place] - [phone] [place] - [phone] [place] - [phone]  Metabolic and Weigh Loss Surgery  Appointments: [place] - [phone]        Neurology  Appointments: [place] - [phone] [place] - [phone]  Neurosurgery  Appointments: [place] - [phone]  Obstetrics & Gynecology  Appointments: [place] - [phone] [place] - [phone]        Pediatrics  Appointments: [place] - [phone] [place] - [phone]  Physiatry  Appointments: [place] -[phone]  Physical Therapy  Appointments: [place] - [phone] [place] - [phone]        Podiatry  Appointments: [place] - [phone] [place] - [phone]  Pulmonology  Appointments: [place] - [phone]  Rheumatology  Appointments: [place] - [phone]        [place] Location: [facility]  [address] , [place]  [address]  [place] Location: [facility] [address] [place], [place]  [address]  [place] Location: [facility] [address] [place], [place]  [address]    

## 2019-10-23 ENCOUNTER — Encounter
Admission: RE | Admit: 2019-10-23 | Discharge: 2019-10-23 | Disposition: A | Discharge status: Home / Self Care | Payer: Medicare HMO | Source: Ambulatory Visit | Attending: Orthopedic Surgery | Admitting: Orthopedic Surgery

## 2019-10-23 ENCOUNTER — Other Ambulatory Visit: Payer: Self-pay

## 2019-10-23 DIAGNOSIS — Z01818 Encounter for other preprocedural examination: Secondary | ICD-10-CM | POA: Insufficient documentation

## 2019-10-23 DIAGNOSIS — Z01810 Encounter for preprocedural cardiovascular examination: Secondary | ICD-10-CM | POA: Diagnosis not present

## 2019-10-23 LAB — CBC
HCT: 36.9 % (ref 36.0–46.0)
Hemoglobin: 12.5 g/dL (ref 12.0–15.0)
MCH: 30.8 pg (ref 26.0–34.0)
MCHC: 33.9 g/dL (ref 30.0–36.0)
MCV: 90.9 fL (ref 80.0–100.0)
Platelets: 216 10*3/uL (ref 150–400)
RBC: 4.06 MIL/uL (ref 3.87–5.11)
RDW: 13.3 % (ref 11.5–15.5)
WBC: 4.9 10*3/uL (ref 4.0–10.5)
nRBC: 0 % (ref 0.0–0.2)

## 2019-10-23 LAB — URINALYSIS, ROUTINE W REFLEX MICROSCOPIC
Bilirubin Urine: NEGATIVE
Glucose, UA: NEGATIVE mg/dL
Hgb urine dipstick: NEGATIVE
Ketones, ur: NEGATIVE mg/dL
Leukocytes,Ua: NEGATIVE
Nitrite: NEGATIVE
Protein, ur: NEGATIVE mg/dL
Specific Gravity, Urine: 1.027 (ref 1.005–1.030)
pH: 5 (ref 5.0–8.0)

## 2019-10-23 LAB — COMPREHENSIVE METABOLIC PANEL
ALT: 16 U/L (ref 0–44)
AST: 24 U/L (ref 15–41)
Albumin: 4.2 g/dL (ref 3.5–5.0)
Alkaline Phosphatase: 34 U/L — ABNORMAL LOW (ref 38–126)
Anion gap: 8 (ref 5–15)
BUN: 21 mg/dL (ref 8–23)
CO2: 29 mmol/L (ref 22–32)
Calcium: 9.7 mg/dL (ref 8.9–10.3)
Chloride: 103 mmol/L (ref 98–111)
Creatinine, Ser: 0.83 mg/dL (ref 0.44–1.00)
GFR calc Af Amer: >60 mL/min (ref >60)
GFR calc non Af Amer: >60 mL/min (ref >60)
Glucose, Bld: 97 mg/dL (ref 70–99)
Potassium: 4.3 mmol/L (ref 3.5–5.1)
Sodium: 140 mmol/L (ref 135–145)
Total Bilirubin: 0.8 mg/dL (ref 0.3–1.2)
Total Protein: 6.7 g/dL (ref 6.5–8.1)

## 2019-10-23 LAB — SURGICAL PCR SCREEN
MRSA, PCR: NEGATIVE
Staphylococcus aureus: NEGATIVE

## 2019-10-23 LAB — TYPE AND SCREEN
ABO/RH(D): A POS
Antibody Screen: NEGATIVE

## 2019-10-23 LAB — PROTIME-INR
INR: 1 (ref 0.8–1.2)
Prothrombin Time: 13.2 seconds (ref 11.4–15.2)

## 2019-10-23 LAB — APTT: aPTT: 32 seconds (ref 24–36)

## 2019-10-23 LAB — SEDIMENTATION RATE: Sed Rate: 7 mm/hr (ref 0–30)

## 2019-10-23 LAB — C-REACTIVE PROTEIN: CRP: <0.8 mg/dL (ref <1.0)

## 2019-10-23 MED ORDER — ENSURE PRE-SURGERY PO LIQD
296.0000 mL | Freq: Once | ORAL | Status: DC
  Filled 2019-10-23: qty 296

## 2019-10-23 NOTE — Patient Instructions (Addendum)
Your procedure is scheduled on: 11/04/2019 Mon Report to Same Day Surgery 2nd floor medical mall [facility] Entrance-take elevator on left to 2nd floor.  Check in with surgery information desk.) To find out your arrival time please call [phone] between 1PM - 3PM on 11/01/2019 Fri  Remember: Instructions that are not followed completely may result in serious medical risk, up to and including death, or upon the discretion of your surgeon and anesthesiologist your surgery may need to be rescheduled.    _x___ 1. Do not eat food after midnight the night before your procedure. You may drink clear liquids up to 2 hours before you are scheduled to arrive at the hospital for your procedure.  Do not drink clear liquids within 2 hours of your scheduled arrival to the hospital.  Clear liquids include  --Water or Apple juice without pulp  --Clear carbohydrate beverage such as ClearFast or Gatorade  --Black Coffee or Clear Tea (No milk, no creamers, do not add anything to                  the coffee or Tea Type 1 and type 2 diabetics should only drink water.   ____Ensure clear carbohydrate drink on the way to the hospital for bariatric patients  _x___Ensure clear carbohydrate drink 3 hours before surgery. Complete 1.5 hours before surgery   No gum chewing or hard candies.     __x__ 2. No Alcohol for 24 hours before or after surgery.   __x__3. No Smoking or e-cigarettes for 24 prior to surgery.  Do not use any chewable tobacco products for at least 6 hour prior to surgery   ____  4. Bring all medications with you on the day of surgery if instructed.    __x__ 5. Notify your doctor if there is any change in your medical condition     (cold, fever, infections).    x___6. On the morning of surgery brush your teeth with toothpaste and water.  You may rinse your mouth with mouth wash if you wish.  Do not swallow any toothpaste or mouthwash.   Do not wear jewelry, make-up, hairpins, clips or  nail polish.  Do not wear lotions, powders, or perfumes. You may wear deodorant.  Do not shave 48 hours prior to surgery. Men may shave face and neck.  Do not bring valuables to the hospital.    [facility] is not responsible for any belongings or valuables.               Contacts, dentures or bridgework may not be worn into surgery.  Leave your suitcase in the car. After surgery it may be brought to your room.  For patients admitted to the hospital, discharge time is determined by your                       treatment team.  _  Patients discharged the day of surgery will not be allowed to drive home.  You will need someone to drive you home and stay with you the night of your procedure.    Please read over the following fact sheets that you were given:   [facility] Preparing for Surgery and or MRSA Information   _x___ Take anti-hypertensive listed below, cardiac, seizure, asthma,     anti-reflux and psychiatric medicines. These include:  1. fluticasone (FLONASE) 50 MCG/ACT nasal spray if needed  2.metoprolol succinate (TOPROL-XL) 100 MG 24 hr tablet  3.ranitidine (ZANTAC) 150  MG capsule   4.  5.  6.  ____Fleets enema or Magnesium Citrate as directed.   _x___ Use CHG Soap or sage wipes as directed on instruction sheet   ____ Use inhalers on the day of surgery and bring to hospital day of surgery  ____ Stop Metformin and Janumet 2 days prior to surgery.    ____ Take 1/2 of usual insulin dose the night before surgery and none on the morning     surgery.   _x___ Follow recommendations from Cardiologist, Pulmonologist or PCP regarding          stopping Aspirin, Coumadin, Plavix ,Eliquis, Effient, or Pradaxa, and Pletal.  X____Stop Anti-inflammatories such as Advil, Aleve, Ibuprofen, Motrin, Naproxen, Naprosyn, Goodies powders or aspirin products. OK to take Tylenol and                          Celebrex.   _x___ Stop supplements until after surgery.  But may continue Vitamin D,  Vitamin B,       and multivitamin.   ____ Bring C-Pap to the hospital.

## 2019-10-24 LAB — URINE CULTURE
Culture: NO GROWTH
Special Requests: NORMAL

## 2019-10-28 ENCOUNTER — Ambulatory Visit
Admission: RE | Admit: 2019-10-28 | Discharge: 2019-10-28 | Disposition: A | Discharge status: Home / Self Care | Payer: Medicare HMO | Source: Ambulatory Visit | Attending: Family Medicine | Admitting: Family Medicine

## 2019-10-28 DIAGNOSIS — M8589 Other specified disorders of bone density and structure, multiple sites: Secondary | ICD-10-CM | POA: Insufficient documentation

## 2019-10-28 DIAGNOSIS — M8580 Other specified disorders of bone density and structure, unspecified site: Secondary | ICD-10-CM

## 2019-10-28 DIAGNOSIS — Z13820 Encounter for screening for osteoporosis: Secondary | ICD-10-CM | POA: Diagnosis not present

## 2019-10-28 DIAGNOSIS — Z780 Asymptomatic menopausal state: Secondary | ICD-10-CM

## 2019-10-29 ENCOUNTER — Other Ambulatory Visit: Payer: Medicare HMO

## 2019-10-31 ENCOUNTER — Other Ambulatory Visit
Admission: RE | Admit: 2019-10-31 | Discharge: 2019-10-31 | Disposition: A | Discharge status: Home / Self Care | Payer: Medicare HMO | Source: Ambulatory Visit | Attending: Orthopedic Surgery | Admitting: Orthopedic Surgery

## 2019-10-31 ENCOUNTER — Other Ambulatory Visit: Payer: Self-pay

## 2019-10-31 DIAGNOSIS — Z01812 Encounter for preprocedural laboratory examination: Secondary | ICD-10-CM | POA: Diagnosis not present

## 2019-10-31 DIAGNOSIS — Z20828 Contact with and (suspected) exposure to other viral communicable diseases: Secondary | ICD-10-CM | POA: Insufficient documentation

## 2019-10-31 LAB — SARS CORONAVIRUS 2 (TAT 6-24 HRS): SARS Coronavirus 2: NEGATIVE

## 2019-11-03 ENCOUNTER — Encounter: Payer: Self-pay | Admitting: Orthopedic Surgery

## 2019-11-03 MED ORDER — TRANEXAMIC ACID-NACL 1000-0.7 MG/100ML-% IV SOLN: 1000.0000 mg | INTRAVENOUS | Status: DC

## 2019-11-03 NOTE — H&P (Signed)
ORTHOPAEDIC HISTORY & PHYSICAL  [place] [facility] Chief Complaint:       Chief Complaint  Patient presents with  . Knee Pain    H & P RIGHT KNEE    History of Present Illness:    Geonna Dickel is a 73 y.o. female that presents to clinic today for her preoperative history and evaluation.  Patient presents unaccompanied. The patient is scheduled to undergo a right total knee arthroplasty on 11/04/19 by Dr. Marry Guan. Her pain began several years ago.  The pain is located in both the medial and lateral aspects of the knee but she states that the medial pain is worse.   She reports associated swelling and some giving way of the knee.  She denies associated numbness, tingling, or locking.  The patient's symptoms have progressed to the point that they decrease her quality of life. The patient has previously undergone conservative treatment including NSAIDS and activity modification.    Past Medical, Surgical, Family, Social History, Allergies, Medications:   Past Medical History:      Past Medical History:  Diagnosis Date  . DDD (degenerative disc disease), lumbar   . Diverticulitis of colon   . Hip osteoarthritis   . Hypertension   . Lumbar pain   . Lumbar spinal stenosis     Past Surgical History:       Past Surgical History:  Procedure Laterality Date  . ACDF  09/18/2012   C4-5, C6-7  . APPENDECTOMY    . Bunionectomies Bilateral   . extensive arthroscopic debridment,arthroscopic SLAP repair,subacromial decompression and mini-open biceps tenodesis,right shoulder  Right 12/20/2017   Dr.Poggi   . Right total shoulder arthroplasty  07/2018   Dr. Onnie Graham  . S/P Breast Biopsy Right   . S/P Tubal Ligation      Current Medications:  Current Medications        Current Outpatient Medications  Medication Sig Dispense Refill  . acetaminophen (TYLENOL) 500 MG tablet Take 500 mg by mouth every 6 (six) hours as  needed for Pain.    Marland Kitchen amoxicillin (AMOXIL) 500 MG capsule Take 500 mg by mouth BEFORE DENTAL APPOINTMENT      . carbamide peroxide (DEBROX) 6.5 % otic solution 5 drops 2 (two) times daily as needed.    . cholecalciferol (VITAMIN D3) 1,000 unit capsule Take 1,000 Units by mouth once daily.    Marland Kitchen docusate sodium (STOOL SOFTENER ORAL) Take 1 tablet by mouth once daily    . fluticasone propionate (FLONASE) 50 mcg/actuation nasal spray by Nasal route    . hydroCHLOROthiazide (MICROZIDE) 12.5 mg capsule Take 12.5 mg by mouth once daily    . losartan (COZAAR) 50 MG tablet Take 50 mg by mouth once daily    . lysine 500 mg Tab Take 1 tablet by mouth once daily       . magnesium oxide 500 mg Tab Take 1 tablet by mouth once daily    . metoprolol succinate (TOPROL-XL) 100 MG XL tablet Take 100 mg by mouth once daily    . naproxen sodium (ALEVE) 220 MG tablet Take 440 mg by mouth 2 (two) times daily as needed       . oxymetazoline (AFRIN) 0.05 % nasal spray Place 2 sprays into both nostrils 2 (two) times daily       . ranitidine (ZANTAC) 150 MG capsule Take 150 mg by mouth 2 (two) times daily as needed       . simvastatin (ZOCOR)  10 MG tablet Take 1 tablet by mouth once daily    . tiZANidine (ZANAFLEX) 4 MG tablet 4 mg every 8 (eight) hours as needed        No current facility-administered medications for this visit.       Allergies:       Allergies  Allergen Reactions  . Codeine Itching    Nightmares  . Oxycodone Itching and Other (See Comments)    (if taken for long period of time)      Social History:  Social History  Social History        Socioeconomic History  . Marital status: Divorced    Spouse name: Not on file  . Number of children: 3  . Years of education: 40  . Highest education level: Not on file  Occupational History  . Occupation: Retired Nurse, learning disability  Social Needs  . Financial resource strain: Not on file  . Food insecurity     Worry: Not on file    Inability: Not on file  . Transportation needs    Medical: Not on file    Non-medical: Not on file  Tobacco Use  . Smoking status: Never Smoker  . Smokeless tobacco: Never Used  Substance and Sexual Activity  . Alcohol use: Not Currently    Alcohol/week: 1.0 standard drinks    Types: 1 Glasses of wine per week    Comment: occassional  . Drug use: Never  . Sexual activity: Defer    Partners: Male  Lifestyle  . Physical activity    Days per week: Not on file    Minutes per session: Not on file  . Stress: Not on file  Relationships  . Social Herbalist on phone: Not on file    Gets together: Not on file    Attends religious service: Not on file    Active member of club or organization: Not on file    Attends meetings of clubs or organizations: Not on file    Relationship status: Not on file  Other Topics Concern  . Not on file  Social History Narrative  . Not on file      Family History:       Family History  Problem Relation Age of Onset  . Heart failure Brother     Review of Systems:   A 10+ ROS was performed, reviewed, and the pertinent orthopaedic findings are documented in the HPI.    Physical Examination:   BP 128/72   Ht 160 cm (5\' 3" )   Wt 64.5 kg (142 lb 3.2 oz)   BMI 25.19 kg/m   Patient is a well-developed, well-nourished female in no acute distress. Patient has normal mood and affect. Patient is alert and oriented to person, place, and time.   HEENT: Atraumatic, normocephalic.  Pupils equal and reactive to light.  Extraocular motion intact.  Noninjected sclera.  Cardiovascular: Regular rate and rhythm, with no murmurs, rubs, or gallops.  Distal pulses palpable.  Respiratory: Lungs clear to auscultation bilaterally.   RightKnee: Soft tissue  swelling:mild Effusion:minimal Erythema:none Crepitance:mild Tenderness:medial Alignment:relative varus Mediolateral laxity:medial pseudolaxity Posterior BK:[id number] Patellar tracking:Good tracking without evidence of subluxation or tilt Atrophy:No significantatrophy.  Quadriceps tone was good. Range of motion:0/0/134degrees  Sensation intact over the saphenous, lateral sural cutaneous, superficial fibular, and deep fibular nerve distributions.  Tests Performed/Reviewed:  X-rays  No new radiographs were obtained today. Previous radiographs were reviewed of the right knee and revealed complete loss of  medial joint space with bone-on-bone contact and subchondral sclerosis.  Osteophytes noted both medially and laterally.  Lateral view reveals posterior osteophytes.  Patellofemoral joint space remains relatively well-preserved with minimal osteophyte formation.   Impression:     ICD-10-CM  1. Primary osteoarthritis of right knee  M17.11  2. Chronic pain of right knee  M25.561   G89.29      Plan:   The patient has end-stage degenerative changes of the right knee.  It was explained to the patient that the condition is progressive in nature.  Having failed conservative treatment, the patient has elected to proceed with a total joint arthroplasty.  The patient will undergo a total joint arthroplasty with Dr. Marry Guan.  The risks of surgery, including blood clot and infection, were discussed with the patient.  Measures to reduce these risks, including the use of anticoagulation, perioperative antibiotics, and early ambulation were discussed.  The importance of postoperative physical therapy was discussed with the patient. The patient elects to proceed with surgery. The patient is  instructed to stop all blood thinners 1 week prior to surgery.  The patient is instructed to call the hospital the day before surgery to learn of the proper arrival time.    Contact our office with any questions or concerns.  Follow up as indicated, or sooner should any new problems arise, if conditions worsen, or if they are otherwise concerned.   Gwenlyn Fudge, PA [facility] [place], [place] [address] Phone: [phone]  This note was generated in part with voice recognition software and I apologize for any typographical errors that were not detected and corrected.     Electronically signed by Gwenlyn Fudge, [place] on 10/24/2019 1:43 PM

## 2019-11-04 ENCOUNTER — Other Ambulatory Visit: Payer: Self-pay

## 2019-11-04 ENCOUNTER — Inpatient Hospital Stay: Payer: Medicare HMO

## 2019-11-04 ENCOUNTER — Inpatient Hospital Stay
Admission: RE | Admit: 2019-11-04 | Discharge: 2019-11-06 | DRG: 470 | Disposition: A | Discharge status: Home Health | Payer: Medicare HMO | Attending: Orthopedic Surgery | Admitting: Orthopedic Surgery

## 2019-11-04 ENCOUNTER — Inpatient Hospital Stay: Payer: Medicare HMO | Admitting: Anesthesiology

## 2019-11-04 ENCOUNTER — Encounter
Admission: RE | Disposition: A | Discharge status: Home Health | Payer: Self-pay | Source: Home / Self Care | Attending: Orthopedic Surgery

## 2019-11-04 DIAGNOSIS — K219 Gastro-esophageal reflux disease without esophagitis: Secondary | ICD-10-CM | POA: Diagnosis present

## 2019-11-04 DIAGNOSIS — N182 Chronic kidney disease, stage 2 (mild): Secondary | ICD-10-CM | POA: Diagnosis present

## 2019-11-04 DIAGNOSIS — Z7951 Long term (current) use of inhaled steroids: Secondary | ICD-10-CM

## 2019-11-04 DIAGNOSIS — Z885 Allergy status to narcotic agent status: Secondary | ICD-10-CM | POA: Diagnosis not present

## 2019-11-04 DIAGNOSIS — Z79899 Other long term (current) drug therapy: Secondary | ICD-10-CM

## 2019-11-04 DIAGNOSIS — M5136 Other intervertebral disc degeneration, lumbar region: Secondary | ICD-10-CM | POA: Diagnosis present

## 2019-11-04 DIAGNOSIS — Z471 Aftercare following joint replacement surgery: Secondary | ICD-10-CM | POA: Diagnosis not present

## 2019-11-04 DIAGNOSIS — I129 Hypertensive chronic kidney disease with stage 1 through stage 4 chronic kidney disease, or unspecified chronic kidney disease: Secondary | ICD-10-CM | POA: Diagnosis present

## 2019-11-04 DIAGNOSIS — Z8249 Family history of ischemic heart disease and other diseases of the circulatory system: Secondary | ICD-10-CM | POA: Diagnosis not present

## 2019-11-04 DIAGNOSIS — G8929 Other chronic pain: Secondary | ICD-10-CM | POA: Diagnosis present

## 2019-11-04 DIAGNOSIS — Z96659 Presence of unspecified artificial knee joint: Secondary | ICD-10-CM

## 2019-11-04 DIAGNOSIS — M48061 Spinal stenosis, lumbar region without neurogenic claudication: Secondary | ICD-10-CM | POA: Diagnosis present

## 2019-11-04 DIAGNOSIS — E785 Hyperlipidemia, unspecified: Secondary | ICD-10-CM | POA: Diagnosis present

## 2019-11-04 DIAGNOSIS — M1711 Unilateral primary osteoarthritis, right knee: Principal | ICD-10-CM | POA: Diagnosis present

## 2019-11-04 DIAGNOSIS — Z96651 Presence of right artificial knee joint: Secondary | ICD-10-CM | POA: Diagnosis not present

## 2019-11-04 DIAGNOSIS — Z96611 Presence of right artificial shoulder joint: Secondary | ICD-10-CM | POA: Diagnosis present

## 2019-11-04 DIAGNOSIS — M8580 Other specified disorders of bone density and structure, unspecified site: Secondary | ICD-10-CM | POA: Diagnosis present

## 2019-11-04 HISTORY — PX: KNEE ARTHROPLASTY: SHX992

## 2019-11-04 LAB — ABO/RH: ABO/RH(D): A POS

## 2019-11-04 SURGERY — ARTHROPLASTY, KNEE, TOTAL, USING IMAGELESS COMPUTER-ASSISTED NAVIGATION
Anesthesia: Spinal | Site: Knee | Laterality: Right

## 2019-11-04 MED ORDER — CARBAMIDE PEROXIDE 6.5 % OT SOLN
5.0000 [drp] | Freq: Two times a day (BID) | OTIC | Status: DC | PRN
  Filled 2019-11-04: qty 15

## 2019-11-04 MED ORDER — METOCLOPRAMIDE HCL 10 MG PO TABS: 5.0000 mg | ORAL_TABLET | Freq: Three times a day (TID) | ORAL | Status: DC | PRN

## 2019-11-04 MED ORDER — METOPROLOL SUCCINATE ER 50 MG PO TB24
100.0000 mg | ORAL_TABLET | Freq: Every day | ORAL | Status: DC
  Administered 2019-11-05 – 2019-11-06 (×2): 100 mg via ORAL
  Filled 2019-11-04 (×2): qty 2

## 2019-11-04 MED ORDER — SIMVASTATIN 20 MG PO TABS
10.0000 mg | ORAL_TABLET | Freq: Every day | ORAL | Status: DC
  Administered 2019-11-04 – 2019-11-05 (×2): 10 mg via ORAL
  Filled 2019-11-04 (×2): qty 1

## 2019-11-04 MED ORDER — SEVOFLURANE IN SOLN
RESPIRATORY_TRACT | Status: AC
  Filled 2019-11-04: qty 250

## 2019-11-04 MED ORDER — ONDANSETRON HCL 4 MG/2ML IJ SOLN: 4.0000 mg | Freq: Once | INTRAMUSCULAR | Status: DC | PRN

## 2019-11-04 MED ORDER — GABAPENTIN 300 MG PO CAPS
ORAL_CAPSULE | ORAL | Status: AC
  Administered 2019-11-04: 300 mg via ORAL
  Filled 2019-11-04: qty 1

## 2019-11-04 MED ORDER — SENNOSIDES-DOCUSATE SODIUM 8.6-50 MG PO TABS
1.0000 | ORAL_TABLET | Freq: Two times a day (BID) | ORAL | Status: DC
  Administered 2019-11-04 – 2019-11-06 (×4): 1 via ORAL
  Filled 2019-11-04 (×4): qty 1

## 2019-11-04 MED ORDER — GABAPENTIN 300 MG PO CAPS
300.0000 mg | ORAL_CAPSULE | Freq: Once | ORAL | Status: AC
  Administered 2019-11-04: 10:00:00 300 mg via ORAL

## 2019-11-04 MED ORDER — CEFAZOLIN SODIUM-DEXTROSE 2-4 GM/100ML-% IV SOLN
INTRAVENOUS | Status: AC
  Filled 2019-11-04: qty 100

## 2019-11-04 MED ORDER — HYDROCHLOROTHIAZIDE 12.5 MG PO CAPS
12.5000 mg | ORAL_CAPSULE | Freq: Every day | ORAL | Status: DC
  Administered 2019-11-05 – 2019-11-06 (×2): 12.5 mg via ORAL
  Filled 2019-11-04 (×3): qty 1

## 2019-11-04 MED ORDER — PROPOFOL 500 MG/50ML IV EMUL
INTRAVENOUS | Status: DC | PRN
  Administered 2019-11-04: 50 ug/kg/min via INTRAVENOUS

## 2019-11-04 MED ORDER — PANTOPRAZOLE SODIUM 40 MG PO TBEC
40.0000 mg | DELAYED_RELEASE_TABLET | Freq: Two times a day (BID) | ORAL | Status: DC
  Administered 2019-11-04 – 2019-11-06 (×4): 40 mg via ORAL
  Filled 2019-11-04 (×4): qty 1

## 2019-11-04 MED ORDER — CELECOXIB 200 MG PO CAPS
ORAL_CAPSULE | ORAL | Status: AC
  Administered 2019-11-04: 400 mg via ORAL
  Filled 2019-11-04: qty 2

## 2019-11-04 MED ORDER — ADULT MULTIVITAMIN W/MINERALS CH
1.0000 | ORAL_TABLET | Freq: Every day | ORAL | Status: DC
  Administered 2019-11-05 – 2019-11-06 (×2): 1 via ORAL
  Filled 2019-11-04 (×2): qty 1

## 2019-11-04 MED ORDER — SODIUM CHLORIDE 0.9 % IV SOLN
INTRAVENOUS | Status: DC
  Administered 2019-11-04 – 2019-11-05 (×3): via INTRAVENOUS

## 2019-11-04 MED ORDER — NEOMYCIN-POLYMYXIN B GU 40-200000 IR SOLN
Status: DC | PRN
  Administered 2019-11-04: 16 mL

## 2019-11-04 MED ORDER — ONDANSETRON HCL 4 MG PO TABS: 4.0000 mg | ORAL_TABLET | Freq: Four times a day (QID) | ORAL | Status: DC | PRN

## 2019-11-04 MED ORDER — MENTHOL 3 MG MT LOZG
1.0000 | LOZENGE | OROMUCOSAL | Status: DC | PRN
  Filled 2019-11-04: qty 9

## 2019-11-04 MED ORDER — CEFAZOLIN SODIUM-DEXTROSE 2-4 GM/100ML-% IV SOLN
2.0000 g | INTRAVENOUS | Status: AC
  Administered 2019-11-04: 2 g via INTRAVENOUS

## 2019-11-04 MED ORDER — GABAPENTIN 300 MG PO CAPS
300.0000 mg | ORAL_CAPSULE | Freq: Every day | ORAL | Status: DC
  Administered 2019-11-04 – 2019-11-05 (×2): 300 mg via ORAL
  Filled 2019-11-04 (×2): qty 1

## 2019-11-04 MED ORDER — TRANEXAMIC ACID-NACL 1000-0.7 MG/100ML-% IV SOLN
INTRAVENOUS | Status: DC | PRN
  Administered 2019-11-04: 1000 mg via INTRAVENOUS

## 2019-11-04 MED ORDER — DEXAMETHASONE SODIUM PHOSPHATE 10 MG/ML IJ SOLN
INTRAMUSCULAR | Status: AC
  Administered 2019-11-04: 8 mg via INTRAVENOUS
  Filled 2019-11-04: qty 1

## 2019-11-04 MED ORDER — TRANEXAMIC ACID-NACL 1000-0.7 MG/100ML-% IV SOLN
INTRAVENOUS | Status: AC
  Filled 2019-11-04: qty 100

## 2019-11-04 MED ORDER — VITAMIN D 25 MCG (1000 UNIT) PO TABS
1000.0000 [IU] | ORAL_TABLET | Freq: Every day | ORAL | Status: DC
  Administered 2019-11-05 – 2019-11-06 (×2): 1000 [IU] via ORAL
  Filled 2019-11-04 (×2): qty 1

## 2019-11-04 MED ORDER — ACETAMINOPHEN 10 MG/ML IV SOLN
INTRAVENOUS | Status: AC
  Filled 2019-11-04: qty 100

## 2019-11-04 MED ORDER — TRANEXAMIC ACID-NACL 1000-0.7 MG/100ML-% IV SOLN
INTRAVENOUS | Status: AC
  Administered 2019-11-04: 1000 mg via INTRAVENOUS
  Filled 2019-11-04: qty 100

## 2019-11-04 MED ORDER — FENTANYL CITRATE (PF) 100 MCG/2ML IJ SOLN: 25.0000 ug | INTRAMUSCULAR | Status: DC | PRN

## 2019-11-04 MED ORDER — OXYCODONE HCL 5 MG PO TABS
5.0000 mg | ORAL_TABLET | ORAL | Status: DC | PRN
  Administered 2019-11-05 (×3): 5 mg via ORAL
  Filled 2019-11-04 (×3): qty 1

## 2019-11-04 MED ORDER — ENOXAPARIN SODIUM 30 MG/0.3ML ~~LOC~~ SOLN
30.0000 mg | Freq: Two times a day (BID) | SUBCUTANEOUS | Status: DC
  Administered 2019-11-05 – 2019-11-06 (×3): 30 mg via SUBCUTANEOUS
  Filled 2019-11-04 (×3): qty 0.3

## 2019-11-04 MED ORDER — SODIUM CHLORIDE 0.9 % IV SOLN
INTRAVENOUS | Status: DC | PRN
  Administered 2019-11-04: 60 mL

## 2019-11-04 MED ORDER — ACETAMINOPHEN 10 MG/ML IV SOLN
1000.0000 mg | Freq: Four times a day (QID) | INTRAVENOUS | Status: AC
  Administered 2019-11-04 – 2019-11-05 (×4): 1000 mg via INTRAVENOUS
  Filled 2019-11-04 (×5): qty 100

## 2019-11-04 MED ORDER — DEXAMETHASONE SODIUM PHOSPHATE 10 MG/ML IJ SOLN
8.0000 mg | Freq: Once | INTRAMUSCULAR | Status: AC
  Administered 2019-11-04: 10:00:00 8 mg via INTRAVENOUS

## 2019-11-04 MED ORDER — PROPOFOL 500 MG/50ML IV EMUL
INTRAVENOUS | Status: AC
  Filled 2019-11-04: qty 50

## 2019-11-04 MED ORDER — FAMOTIDINE 20 MG PO TABS
ORAL_TABLET | ORAL | Status: AC
  Filled 2019-11-04: qty 1

## 2019-11-04 MED ORDER — FAMOTIDINE 20 MG PO TABS
20.0000 mg | ORAL_TABLET | Freq: Once | ORAL | Status: AC
  Administered 2019-11-04: 20 mg via ORAL

## 2019-11-04 MED ORDER — ACETAMINOPHEN 10 MG/ML IV SOLN
INTRAVENOUS | Status: DC | PRN
  Administered 2019-11-04: 1000 mg via INTRAVENOUS

## 2019-11-04 MED ORDER — OXYCODONE HCL 5 MG PO TABS
10.0000 mg | ORAL_TABLET | ORAL | Status: DC | PRN
  Administered 2019-11-06: 10 mg via ORAL
  Filled 2019-11-04: qty 2

## 2019-11-04 MED ORDER — CELECOXIB 200 MG PO CAPS
400.0000 mg | ORAL_CAPSULE | Freq: Once | ORAL | Status: AC
  Administered 2019-11-04: 10:00:00 400 mg via ORAL

## 2019-11-04 MED ORDER — FLUTICASONE PROPIONATE 50 MCG/ACT NA SUSP
1.0000 | Freq: Every day | NASAL | Status: DC
  Filled 2019-11-04: qty 16

## 2019-11-04 MED ORDER — FENTANYL CITRATE (PF) 100 MCG/2ML IJ SOLN
INTRAMUSCULAR | Status: DC | PRN
  Administered 2019-11-04 (×2): 50 ug via INTRAVENOUS

## 2019-11-04 MED ORDER — DIPHENHYDRAMINE HCL 12.5 MG/5ML PO ELIX: 12.5000 mg | ORAL_SOLUTION | ORAL | Status: DC | PRN

## 2019-11-04 MED ORDER — LACTATED RINGERS IV SOLN
INTRAVENOUS | Status: DC
  Administered 2019-11-04: 10:00:00 via INTRAVENOUS

## 2019-11-04 MED ORDER — MIDAZOLAM HCL 2 MG/2ML IJ SOLN
INTRAMUSCULAR | Status: AC
  Filled 2019-11-04: qty 2

## 2019-11-04 MED ORDER — MAGNESIUM HYDROXIDE 400 MG/5ML PO SUSP
30.0000 mL | Freq: Every day | ORAL | Status: DC
  Administered 2019-11-05 – 2019-11-06 (×2): 30 mL via ORAL
  Filled 2019-11-04 (×2): qty 30

## 2019-11-04 MED ORDER — BUPIVACAINE HCL (PF) 0.5 % IJ SOLN
INTRAMUSCULAR | Status: DC | PRN
  Administered 2019-11-04: 3 mL

## 2019-11-04 MED ORDER — BISACODYL 10 MG RE SUPP: 10.0000 mg | Freq: Every day | RECTAL | Status: DC | PRN

## 2019-11-04 MED ORDER — FERROUS SULFATE 325 (65 FE) MG PO TABS
325.0000 mg | ORAL_TABLET | Freq: Two times a day (BID) | ORAL | Status: DC
  Administered 2019-11-05 – 2019-11-06 (×3): 325 mg via ORAL
  Filled 2019-11-04 (×3): qty 1

## 2019-11-04 MED ORDER — TRANEXAMIC ACID-NACL 1000-0.7 MG/100ML-% IV SOLN
1000.0000 mg | Freq: Once | INTRAVENOUS | Status: AC
  Administered 2019-11-04: 16:00:00 1000 mg via INTRAVENOUS

## 2019-11-04 MED ORDER — ACETAMINOPHEN 325 MG PO TABS: 325.0000 mg | ORAL_TABLET | Freq: Four times a day (QID) | ORAL | Status: DC | PRN

## 2019-11-04 MED ORDER — BUPIVACAINE HCL (PF) 0.25 % IJ SOLN
INTRAMUSCULAR | Status: DC | PRN
  Administered 2019-11-04: 60 mL

## 2019-11-04 MED ORDER — CEFAZOLIN SODIUM-DEXTROSE 2-4 GM/100ML-% IV SOLN
2.0000 g | Freq: Four times a day (QID) | INTRAVENOUS | Status: AC
  Administered 2019-11-04 – 2019-11-05 (×4): 2 g via INTRAVENOUS
  Filled 2019-11-04 (×4): qty 100

## 2019-11-04 MED ORDER — PHENOL 1.4 % MT LIQD
1.0000 | OROMUCOSAL | Status: DC | PRN
  Filled 2019-11-04: qty 177

## 2019-11-04 MED ORDER — MAGNESIUM OXIDE 400 (241.3 MG) MG PO TABS
400.0000 mg | ORAL_TABLET | Freq: Every day | ORAL | Status: DC
  Administered 2019-11-05 – 2019-11-06 (×2): 400 mg via ORAL
  Filled 2019-11-04 (×2): qty 1

## 2019-11-04 MED ORDER — FLEET ENEMA 7-19 GM/118ML RE ENEM: 1.0000 | ENEMA | Freq: Once | RECTAL | Status: DC | PRN

## 2019-11-04 MED ORDER — OXYMETAZOLINE HCL 0.05 % NA SOLN
1.0000 | Freq: Two times a day (BID) | NASAL | Status: DC | PRN
  Filled 2019-11-04: qty 15

## 2019-11-04 MED ORDER — ONDANSETRON HCL 4 MG/2ML IJ SOLN: 4.0000 mg | Freq: Four times a day (QID) | INTRAMUSCULAR | Status: DC | PRN

## 2019-11-04 MED ORDER — TRAMADOL HCL 50 MG PO TABS
50.0000 mg | ORAL_TABLET | ORAL | Status: DC | PRN
  Administered 2019-11-05: 50 mg via ORAL
  Administered 2019-11-05 (×2): 100 mg via ORAL
  Administered 2019-11-06: 50 mg via ORAL
  Administered 2019-11-06: 100 mg via ORAL
  Filled 2019-11-04: qty 2
  Filled 2019-11-04: qty 1
  Filled 2019-11-04 (×2): qty 2
  Filled 2019-11-04: qty 1

## 2019-11-04 MED ORDER — METOCLOPRAMIDE HCL 5 MG/ML IJ SOLN: 5.0000 mg | Freq: Three times a day (TID) | INTRAMUSCULAR | Status: DC | PRN

## 2019-11-04 MED ORDER — TIZANIDINE HCL 4 MG PO TABS
4.0000 mg | ORAL_TABLET | Freq: Three times a day (TID) | ORAL | Status: DC | PRN
  Filled 2019-11-04: qty 1

## 2019-11-04 MED ORDER — HYDROMORPHONE HCL 1 MG/ML IJ SOLN: 0.5000 mg | INTRAMUSCULAR | Status: DC | PRN

## 2019-11-04 MED ORDER — METOCLOPRAMIDE HCL 10 MG PO TABS
10.0000 mg | ORAL_TABLET | Freq: Three times a day (TID) | ORAL | Status: DC
  Administered 2019-11-04 – 2019-11-06 (×6): 10 mg via ORAL
  Filled 2019-11-04 (×6): qty 1

## 2019-11-04 MED ORDER — MIDAZOLAM HCL 5 MG/5ML IJ SOLN
INTRAMUSCULAR | Status: DC | PRN
  Administered 2019-11-04: 2 mg via INTRAVENOUS

## 2019-11-04 MED ORDER — CHLORHEXIDINE GLUCONATE 4 % EX LIQD: 60.0000 mL | Freq: Once | CUTANEOUS | Status: DC

## 2019-11-04 MED ORDER — LOSARTAN POTASSIUM 50 MG PO TABS
100.0000 mg | ORAL_TABLET | Freq: Every day | ORAL | Status: DC
  Filled 2019-11-04: qty 2

## 2019-11-04 MED ORDER — CELECOXIB 200 MG PO CAPS
200.0000 mg | ORAL_CAPSULE | Freq: Two times a day (BID) | ORAL | Status: DC
  Administered 2019-11-04 – 2019-11-06 (×4): 200 mg via ORAL
  Filled 2019-11-04 (×4): qty 1

## 2019-11-04 MED ORDER — ALUM & MAG HYDROXIDE-SIMETH 200-200-20 MG/5ML PO SUSP: 30.0000 mL | ORAL | Status: DC | PRN

## 2019-11-04 MED ORDER — FENTANYL CITRATE (PF) 100 MCG/2ML IJ SOLN
INTRAMUSCULAR | Status: AC
  Filled 2019-11-04: qty 2

## 2019-11-04 SURGICAL SUPPLY — 79 items
ATTUNE PS FEM RT SZ 7 CEM KNEE (Femur) ×1 IMPLANT
ATTUNE PSRP INSR SZ7 5 KNEE (Insert) ×1 IMPLANT
BASE TIBIA ATTUNE KNEE SYS SZ6 (Knees) IMPLANT
BATTERY INSTRU NAVIGATION (MISCELLANEOUS) ×8 IMPLANT
BLADE SAW 70X12.5 (BLADE) ×2 IMPLANT
BLADE SAW 90X13X1.19 OSCILLAT (BLADE) ×2 IMPLANT
BLADE SAW 90X25X1.19 OSCILLAT (BLADE) ×2 IMPLANT
CANISTER SUCT 3000ML PPV (MISCELLANEOUS) ×2 IMPLANT
CEMENT HV SMART SET (Cement) ×4 IMPLANT
COOLER ICEMAN CLASSIC (MISCELLANEOUS) ×2 IMPLANT
COVER WAND RF STERILE (DRAPES) ×2 IMPLANT
CUFF TOURN SGL QUICK 24 (TOURNIQUET CUFF) ×1
CUFF TRNQT CYL 24X4X16.5-23 (TOURNIQUET CUFF) IMPLANT
DRAPE 3/4 80X56 (DRAPES) ×2 IMPLANT
DRSG DERMACEA 8X12 NADH (GAUZE/BANDAGES/DRESSINGS) ×3 IMPLANT
DRSG OPSITE POSTOP 4X12 (GAUZE/BANDAGES/DRESSINGS) ×1 IMPLANT
DRSG OPSITE POSTOP 4X14 (GAUZE/BANDAGES/DRESSINGS) ×2 IMPLANT
DRSG TEGADERM 4X4.75 (GAUZE/BANDAGES/DRESSINGS) ×2 IMPLANT
DURAPREP 26ML APPLICATOR (WOUND CARE) ×4 IMPLANT
ELECT REM PT RETURN 9FT ADLT (ELECTROSURGICAL) ×2
ELECTRODE REM PT RTRN 9FT ADLT (ELECTROSURGICAL) ×1 IMPLANT
EX-PIN ORTHOLOCK NAV 4X150 (PIN) ×4 IMPLANT
GLOVE BIO SURGEON STRL SZ7.5 (GLOVE) ×4 IMPLANT
GLOVE BIOGEL M STRL SZ7.5 (GLOVE) ×4 IMPLANT
GLOVE BIOGEL PI IND STRL 7.5 (GLOVE) ×1 IMPLANT
GLOVE BIOGEL PI INDICATOR 7.5 (GLOVE) ×1
GLOVE INDICATOR 8.0 STRL GRN (GLOVE) ×2 IMPLANT
GOWN STRL REUS W/ TWL LRG LVL3 (GOWN DISPOSABLE) ×2 IMPLANT
GOWN STRL REUS W/ TWL XL LVL3 (GOWN DISPOSABLE) ×1 IMPLANT
GOWN STRL REUS W/TWL LRG LVL3 (GOWN DISPOSABLE) ×2
GOWN STRL REUS W/TWL XL LVL3 (GOWN DISPOSABLE) ×1
HEMOVAC 400CC 10FR (MISCELLANEOUS) ×2 IMPLANT
HOLDER FOLEY CATH W/STRAP (MISCELLANEOUS) ×2 IMPLANT
HOOD PEEL AWAY FLYTE STAYCOOL (MISCELLANEOUS) ×4 IMPLANT
KIT TURNOVER KIT A (KITS) ×2 IMPLANT
KNIFE SCULPS 14X20 (INSTRUMENTS) ×2 IMPLANT
LABEL OR SOLS (LABEL) ×2 IMPLANT
MANIFOLD NEPTUNE II (INSTRUMENTS) ×2 IMPLANT
MAT ABSORB  FLUID 56X50 GRAY (MISCELLANEOUS) ×1
MAT ABSORB FLUID 56X50 GRAY (MISCELLANEOUS) IMPLANT
NDL SAFETY ECLIPSE 18X1.5 (NEEDLE) ×1 IMPLANT
NDL SPNL 20GX3.5 QUINCKE YW (NEEDLE) ×2 IMPLANT
NEEDLE HYPO 18GX1.5 SHARP (NEEDLE) ×1
NEEDLE SPNL 20GX3.5 QUINCKE YW (NEEDLE) ×4 IMPLANT
NS IRRIG 500ML POUR BTL (IV SOLUTION) ×2 IMPLANT
PACK TOTAL KNEE (MISCELLANEOUS) ×2 IMPLANT
PAD ABD DERMACEA PRESS 5X9 (GAUZE/BANDAGES/DRESSINGS) ×1 IMPLANT
PAD CAST CTTN 4X4 STRL (SOFTGOODS) IMPLANT
PAD COLD SHLDR UNI WRAP-ON (PAD) ×2
PAD COLD UNI WRAP-ON (PAD) IMPLANT
PAD WRAPON POLAR KNEE (MISCELLANEOUS) ×1 IMPLANT
PADDING CAST COTTON 4X4 STRL (SOFTGOODS) ×1
PATELLA MEDIAL ATTUN 35MM KNEE (Knees) ×1 IMPLANT
PENCIL SMOKE ULTRAEVAC 22 CON (MISCELLANEOUS) ×2 IMPLANT
PIN DRILL QUICK PACK ×2 IMPLANT
PIN FIXATION 1/8DIA X 3INL (PIN) ×6 IMPLANT
PULSAVAC PLUS IRRIG FAN TIP (DISPOSABLE) ×2
SOL .9 NS 3000ML IRR  AL (IV SOLUTION) ×1
SOL .9 NS 3000ML IRR UROMATIC (IV SOLUTION) ×1 IMPLANT
SOL PREP PVP 2OZ (MISCELLANEOUS) ×2
SOLUTION PREP PVP 2OZ (MISCELLANEOUS) ×1 IMPLANT
SPONGE DRAIN TRACH 4X4 STRL 2S (GAUZE/BANDAGES/DRESSINGS) ×2 IMPLANT
STAPLER SKIN PROX 35W (STAPLE) ×2 IMPLANT
STOCKINETTE BIAS CUT 6 980064 (GAUZE/BANDAGES/DRESSINGS) ×1 IMPLANT
STOCKINETTE IMPERV 14X48 (MISCELLANEOUS) IMPLANT
STRAP TIBIA SHORT (MISCELLANEOUS) ×2 IMPLANT
SUCTION FRAZIER HANDLE 10FR (MISCELLANEOUS) ×1
SUCTION TUBE FRAZIER 10FR DISP (MISCELLANEOUS) ×1 IMPLANT
SUT VIC AB 0 CT1 36 (SUTURE) ×3 IMPLANT
SUT VIC AB 1 CT1 36 (SUTURE) ×4 IMPLANT
SUT VIC AB 2-0 CT2 27 (SUTURE) ×2 IMPLANT
SYR 20ML LL LF (SYRINGE) ×2 IMPLANT
SYR 30ML LL (SYRINGE) ×4 IMPLANT
TIBIA ATTUNE KNEE SYS BASE SZ6 (Knees) ×2 IMPLANT
TIP FAN IRRIG PULSAVAC PLUS (DISPOSABLE) ×1 IMPLANT
TOWEL OR 17X26 4PK STRL BLUE (TOWEL DISPOSABLE) ×2 IMPLANT
TOWER CARTRIDGE SMART MIX (DISPOSABLE) ×2 IMPLANT
TRAY FOLEY MTR SLVR 16FR STAT (SET/KITS/TRAYS/PACK) ×2 IMPLANT
WRAPON POLAR PAD KNEE (MISCELLANEOUS)

## 2019-11-04 NOTE — Anesthesia Procedure Notes (Signed)
Spinal  Patient location during procedure: OR Start time: 11/04/2019 11:50 AM End time: 11/04/2019 11:59 AM Staffing Anesthesiologist: Kephart, William K, MD Resident/CRNA: Fletcher, Patricia, CRNA Performed: resident/CRNA and anesthesiologist  Preanesthetic Checklist Completed: patient identified, site marked, surgical consent, pre-op evaluation, timeout performed, IV checked, risks and benefits discussed and monitors and equipment checked Spinal Block Patient position: sitting Prep: Betadine and DuraPrep Patient monitoring: heart rate, cardiac monitor, continuous pulse ox and blood pressure Approach: midline Location: L3-4 Injection technique: single-shot Needle Needle type: Sprotte  Needle gauge: 24 G Needle length: 9 cm Assessment Sensory level: T4 Additional Notes Negative paresthesia. Negative blood return. Positive free-flowing CSF. Expiration date of kit checked and confirmed. Patient tolerated procedure well, without complications.       

## 2019-11-04 NOTE — Anesthesia Preprocedure Evaluation (Signed)
Anesthesia Evaluation  Patient identified by MRN, date of birth, ID band Patient awake    Reviewed: reviewed documented beta blocker date and time   History of Anesthesia Complications (+) PONV and history of anesthetic complications  Airway Mallampati: III       Dental   Pulmonary neg sleep apnea, neg COPD, Not current smoker,           Cardiovascular hypertension, Pt. on medications and Pt. on home beta blockers (-) Past MI and (-) CHF (-) dysrhythmias (-) Valvular Problems/Murmurs     Neuro/Psych neg Seizures    GI/Hepatic Neg liver ROS, GERD (no problems in several years)  ,  Endo/Other  neg diabetes  Renal/GU Renal InsufficiencyRenal disease     Musculoskeletal   Abdominal   Peds  Hematology   Anesthesia Other Findings   Reproductive/Obstetrics                             Anesthesia Physical Anesthesia Plan  ASA: II  Anesthesia Plan: Spinal   Post-op Pain Management:    Induction:   PONV Risk Score and Plan:   Airway Management Planned:   Additional Equipment:   Intra-op Plan:   Post-operative Plan:   Informed Consent: I have reviewed the patients History and Physical, chart, labs and discussed the procedure including the risks, benefits and alternatives for the proposed anesthesia with the patient or authorized representative who has indicated his/her understanding and acceptance.       Plan Discussed with:   Anesthesia Plan Comments:         Anesthesia Quick Evaluation

## 2019-11-04 NOTE — Anesthesia Post-op Follow-up Note (Signed)
Anesthesia QCDR form completed.        

## 2019-11-04 NOTE — Op Note (Signed)
OPERATIVE NOTE  DATE OF SURGERY:  11/04/2019  PATIENT NAME:  Rachel James   DOB: 1946-09-22  MRN: [id number]  PRE-OPERATIVE DIAGNOSIS: Degenerative arthrosis of the right knee, primary  POST-OPERATIVE DIAGNOSIS:  Same  PROCEDURE:  Right total knee arthroplasty using computer-assisted navigation  SURGEON:  Marciano Sequin. M.D.  ASSISTANT: Cassell Smiles, PA-C (present and scrubbed throughout the case, critical for assistance with exposure, retraction, instrumentation, and closure)  ANESTHESIA: spinal  ESTIMATED BLOOD LOSS: 50 mL  FLUIDS REPLACED: 1000 mL of crystalloid  TOURNIQUET TIME: 81 minutes  DRAINS: 2 medium Hemovac drains  SOFT TISSUE RELEASES: Anterior cruciate ligament, posterior cruciate ligament, deep medial collateral ligament, patellofemoral ligament  IMPLANTS UTILIZED: DePuy Attune size 7 posterior stabilized femoral component (cemented), size 6 rotating platform tibial component (cemented), 35 mm medialized dome patella (cemented), and a 5 mm stabilized rotating platform polyethylene insert.  INDICATIONS FOR SURGERY: Rachel James is a 73 y.o. year old female with a long history of progressive knee pain. X-rays demonstrated severe degenerative changes in tricompartmental fashion. The patient had not seen any significant improvement despite conservative nonsurgical intervention. After discussion of the risks and benefits of surgical intervention, the patient expressed understanding of the risks benefits and agree with plans for total knee arthroplasty.   The risks, benefits, and alternatives were discussed at length including but not limited to the risks of infection, bleeding, nerve injury, stiffness, blood clots, the need for revision surgery, cardiopulmonary complications, among others, and they were willing to proceed.  PROCEDURE IN DETAIL: The patient was brought into the operating room and, after adequate spinal anesthesia was achieved, a tourniquet was  placed on the patient's upper thigh. The patient's knee and leg were cleaned and prepped with alcohol and DuraPrep and draped in the usual sterile fashion. A "timeout" was performed as per usual protocol. The lower extremity was exsanguinated using an Esmarch, and the tourniquet was inflated to 300 mmHg. An anterior longitudinal incision was made followed by a standard mid vastus approach. The deep fibers of the medial collateral ligament were elevated in a subperiosteal fashion off of the medial flare of the tibia so as to maintain a continuous soft tissue sleeve. The patella was subluxed laterally and the patellofemoral ligament was incised. Inspection of the knee demonstrated severe degenerative changes with full-thickness loss of articular cartilage. Osteophytes were debrided using a rongeur. Anterior and posterior cruciate ligaments were excised. Two 4.0 mm Schanz pins were inserted in the femur and into the tibia for attachment of the array of trackers used for computer-assisted navigation. Hip center was identified using a circumduction technique. Distal landmarks were mapped using the computer. The distal femur and proximal tibia were mapped using the computer. The distal femoral cutting guide was positioned using computer-assisted navigation so as to achieve a 5 distal valgus cut. The femur was sized and it was felt that a size 7 femoral component was appropriate. A size 7 femoral cutting guide was positioned and the anterior cut was performed and verified using the computer. This was followed by completion of the posterior and chamfer cuts. Femoral cutting guide for the central box was then positioned in the center box cut was performed.  Attention was then directed to the proximal tibia. Medial and lateral menisci were excised. The extramedullary tibial cutting guide was positioned using computer-assisted navigation so as to achieve a 0 varus-valgus alignment and 3 posterior slope. The cut was  performed and verified using the computer. The proximal tibia was  sized and it was felt that a size 6 tibial tray was appropriate. Tibial and femoral trials were inserted followed by insertion of a 5 mm polyethylene insert. This allowed for excellent mediolateral soft tissue balancing both in flexion and in full extension. Finally, the patella was cut and prepared so as to accommodate a 35 mm medialized dome patella. A patella trial was placed and the knee was placed through a range of motion with excellent patellar tracking appreciated. The femoral trial was removed after debridement of posterior osteophytes. The central post-hole for the tibial component was reamed followed by insertion of a keel punch. Tibial trials were then removed. Cut surfaces of bone were irrigated with copious amounts of normal saline with antibiotic solution using pulsatile lavage and then suctioned dry. Polymethylmethacrylate cement was prepared in the usual fashion using a vacuum mixer. Cement was applied to the cut surface of the proximal tibia as well as along the undersurface of a size 6 rotating platform tibial component. Tibial component was positioned and impacted into place. Excess cement was removed using [employer] fast streamer. Cement was then applied to the cut surfaces of the femur as well as along the posterior flanges of the size 7 femoral component. The femoral component was positioned and impacted into place. Excess cement was removed using [employer] fast streamer. A 5 mm polyethylene trial was inserted and the knee was brought into full extension with steady axial compression applied. Finally, cement was applied to the backside of a 35 mm medialized dome patella and the patellar component was positioned and patellar clamp applied. Excess cement was removed using [employer] fast streamer. After adequate curing of the cement, the tourniquet was deflated after a total tourniquet time of 81 minutes. Hemostasis was achieved using electrocautery. The  knee was irrigated with copious amounts of normal saline with antibiotic solution using pulsatile lavage and then suctioned dry. 20 mL of 1.3% Exparel and 60 mL of 0.25% Marcaine in 40 mL of normal saline was injected along the posterior capsule, medial and lateral gutters, and along the arthrotomy site. A 5 mm stabilized rotating platform polyethylene insert was inserted and the knee was placed through a range of motion with excellent mediolateral soft tissue balancing appreciated and excellent patellar tracking noted. 2 medium drains were placed in the wound bed and brought out through separate stab incisions. The medial parapatellar portion of the incision was reapproximated using interrupted sutures of #1 Vicryl. Subcutaneous tissue was approximated in layers using first #0 Vicryl followed #2-0 Vicryl. The skin was approximated with skin staples. A sterile dressing was applied.  The patient tolerated the procedure well and was transported to the recovery room in stable condition.    Inas Avena P. Holley Bouche., M.D.

## 2019-11-04 NOTE — Transfer of Care (Signed)
Immediate Anesthesia Transfer of Care Note  Patient: Rachel James  Procedure(s) Performed: COMPUTER ASSISTED TOTAL KNEE ARTHROPLASTY (Right Knee)  Patient Location: PACU  Anesthesia Type:Spinal  Level of Consciousness: awake and alert   Airway & Oxygen Therapy: Patient Spontanous Breathing and Patient connected to face mask oxygen  Post-op Assessment: Report given to RN and Post -op Vital signs reviewed and stable  Post vital signs: Reviewed and stable  Last Vitals:  Vitals Value Taken Time  BP 106/91 11/04/19 1526  Temp 36.4 C 11/04/19 1526  Pulse 67 11/04/19 1528  Resp 20 11/04/19 1528  SpO2 100 % 11/04/19 1528  Vitals shown include unvalidated device data.  Last Pain:  Vitals:   11/04/19 1008  TempSrc: Temporal  PainSc: 2          Complications: No apparent anesthesia complications

## 2019-11-04 NOTE — Progress Notes (Signed)
D: Pt alert and oriented. Pt denies experiencing any pain at this time. Skin assessment preformed w/Emily G RN. Pt has Iceman, foot pumps, TED hose, a foley cather, and bone foam.   A: Scheduled medications administered to pt, per MD orders. Support and encouragement provided. Frequent verbal contact made. Routine safety checks conducted q15 minutes.   R: No adverse drug reactions noted. Pt complaint with medications and treatment plan. Pt interacts well with staff on the unit. Pt is stable at this time, will continue to monitor and provide care for as ordered.

## 2019-11-04 NOTE — H&P (Signed)
The patient has been re-examined, and the chart reviewed, and there have been no interval changes to the documented history and physical.    The risks, benefits, and alternatives have been discussed at length. The patient expressed understanding of the risks benefits and agreed with plans for surgical intervention.  James P. Hooten, Jr. M.D.    

## 2019-11-05 ENCOUNTER — Encounter: Payer: Self-pay | Admitting: Orthopedic Surgery

## 2019-11-05 MED ORDER — CHLORHEXIDINE GLUCONATE CLOTH 2 % EX PADS
6.0000 | MEDICATED_PAD | Freq: Every day | CUTANEOUS | Status: DC
  Administered 2019-11-05 – 2019-11-06 (×2): 6 via TOPICAL

## 2019-11-05 NOTE — TOC Progression Note (Signed)
Transition of Care [facility] - Progression Note    Patient Details  Name: Rachel James MRN: [id number] Date of Birth: 05-14-1946  [facility] of [facility]) CM/SW Contact  Su Hilt, RN Phone Number: 11/05/2019, 11:31 AM  Clinical Narrative:     Requested the price of Lovenox       Expected Discharge Plan and Services                                                 Social Determinants of Health (SDOH) Interventions    Readmission Risk Interventions No flowsheet data found.

## 2019-11-05 NOTE — Evaluation (Signed)
Physical Therapy Evaluation Patient Details Name: Rachel James MRN: [id number] DOB: [birth year] Today's Date: 11/05/2019   History of Present Illness  Pt admitted for R TKR.  Clinical Impression  Pt is a pleasant 73 year old female who was admitted for R TKR. Pt performs bed mobility, transfers, and ambulation with cga and RW. Pt demonstrates ability to perform 10 SLRs with independence, therefore does not require KI for mobility. Pt demonstrates deficits with strength/mobility/ROM. Pt is very motivated to perform therapy. Would benefit from skilled PT to address above deficits and promote optimal return to PLOF. Recommend transition to [place] upon discharge from acute hospitalization.     Follow Up Recommendations Home health PT    Equipment Recommendations  (may need RW, niece is checking on her equipment)    Recommendations for Other Services       Precautions / Restrictions Precautions Precautions: Fall;Knee Precaution Booklet Issued: No Restrictions Weight Bearing Restrictions: Yes RLE Weight Bearing: Weight bearing as tolerated      Mobility  Bed Mobility Overal bed mobility: Needs Assistance Bed Mobility: Supine to Sit     Supine to sit: Min guard     General bed mobility comments: safe technique with ability to demonstrate ease of movement. Once seated, does report slight dizziness, however improves with rest break  Transfers Overall transfer level: Needs assistance Equipment used: Rolling walker (2 wheeled) Transfers: Sit to/from Stand Sit to Stand: Min guard         General transfer comment: safe technique with upright posture  Ambulation/Gait Ambulation/Gait assistance: Min guard Gait Distance (Feet): 110 Feet Assistive device: Rolling walker (2 wheeled) Gait Pattern/deviations: Step-to pattern     General Gait Details: ambulated in hallway demonstrating slow step to gait pattern. Choppy use of RW. Does report dizziness at end of ambulation,  however no overt LOB  Stairs            Wheelchair Mobility    Modified Rankin (Stroke Patients Only)       Balance Overall balance assessment: Needs assistance Sitting-balance support: Feet supported Sitting balance-Leahy Scale: Good     Standing balance support: Bilateral upper extremity supported Standing balance-Leahy Scale: Good                               Pertinent Vitals/Pain Pain Assessment: 0-10 Pain Score: 4  Pain Location: R knee Pain Descriptors / Indicators: Operative site guarding Pain Intervention(s): Limited activity within patient's tolerance;Repositioned;Premedicated before session;Ice applied    Home Living Family/patient expects to be discharged to:: Private residence Living Arrangements: Alone Available Help at Discharge: (will have niece to stay with her post dc) Type of Home: House Home Access: Stairs to enter Entrance Stairs-Rails: None Entrance Stairs-Number of Steps: 2-3 Home Layout: One level Home Equipment: Bedside commode;Shower seat      Prior Function Level of Independence: Independent         Comments: active and indep prior to admission. No falls     Hand Dominance        Extremity/Trunk Assessment   Upper Extremity Assessment Upper Extremity Assessment: Overall WFL for tasks assessed    Lower Extremity Assessment Lower Extremity Assessment: Generalized weakness(R LE grossly 3/5; L LE grossly 5/5)       Communication   Communication: No difficulties  Cognition Arousal/Alertness: Awake/alert Behavior During Therapy: WFL for tasks assessed/performed Overall Cognitive Status: Within Functional Limits for tasks assessed  General Comments      Exercises Total Joint Exercises Goniometric ROM: R knee AAROM: 0-73 degrees Other Exercises Other Exercises: supine ther-ex performed with supervision including R LE AP, quad sets, SLRs, and hip  abd/add. Safe technique with minimal pain reported   Assessment/Plan    PT Assessment Patient needs continued PT services  PT Problem List Decreased strength;Decreased range of motion;Decreased balance;Decreased mobility;Decreased knowledge of use of DME;Pain       PT Treatment Interventions DME instruction;Gait training;Stair training;Therapeutic exercise    PT Goals (Current goals can be found in the Care Plan section)  Acute Rehab PT Goals Patient Stated Goal: to go home PT Goal Formulation: With patient Time For Goal Achievement: 11/19/19 Potential to Achieve Goals: Good    Frequency BID   Barriers to discharge        Co-evaluation               AM-PAC PT "6 Clicks" Mobility  Outcome Measure Help needed turning from your back to your side while in a flat bed without using bedrails?: A Little Help needed moving from lying on your back to sitting on the side of a flat bed without using bedrails?: A Little Help needed moving to and from a bed to a chair (including a wheelchair)?: A Little Help needed standing up from a chair using your arms (e.g., wheelchair or bedside chair)?: A Little Help needed to walk in hospital room?: A Little Help needed climbing 3-5 steps with a railing? : A Little 6 Click Score: 18    End of Session Equipment Utilized During Treatment: Gait belt Activity Tolerance: Patient tolerated treatment well Patient left: in chair;with chair alarm set;with SCD's reapplied Nurse Communication: Mobility status PT Visit Diagnosis: Muscle weakness (generalized) (M62.81);Difficulty in walking, not elsewhere classified (R26.2);Pain Pain - Right/Left: Right Pain - part of body: Knee    Time: [id number] PT Time Calculation (min) (ACUTE ONLY): 37 min   Charges:   PT Evaluation $PT Eval Low Complexity: 1 Low PT Treatments $Gait Training: 8-22 mins $Therapeutic Exercise: 8-22 mins        Greggory Stallion, PT,  DPT [phone]   Rachel James 11/05/2019, 10:32 AM

## 2019-11-05 NOTE — TOC Progression Note (Signed)
Transition of Care [facility]) - Progression Note    Patient Details  Name: Rachel James MRN: [id number] Date of Birth: [birth year]  Transition of Care [facility]) CM/SW Contact  Su Hilt, RN Phone Number: 11/05/2019, 3:17 PM  Clinical Narrative:    Marthann Schiller with [facility] and requested them to accept the patient, He will get back to me and let me know if they can accept her   Expected Discharge Plan: [place] Barriers to Discharge: Continued Medical Work up  Expected Discharge Plan and Services Expected Discharge Plan: [place]   Discharge Planning Services: CM Consult   Living arrangements for the past 2 months: [place]                 DME Arranged: N/A         HH Arranged: PT           Social Determinants of Health (SDOH) Interventions    Readmission Risk Interventions No flowsheet data found.

## 2019-11-05 NOTE — Progress Notes (Signed)
ORTHOPAEDICS PROGRESS NOTE  PATIENT NAME: Rachel James DOB: Sep 12, 1946  MRN: [id number]  POD # 1: Right total knee arthroplasty  Subjective: The patient rested well last night.  Pain has been under good control. She denies any nausea or vomiting.  Objective: Vital signs in last 24 hours: Temp:  [97.6 F (36.4 C)-98.3 F (36.8 C)] 97.7 F (36.5 C) (11/17 0435) Pulse Rate:  [57-75] 69 (11/17 0435) Resp:  [15-18] 18 (11/17 0014) BP: (106-163)/(52-93) 138/87 (11/17 0435) SpO2:  [96 %-100 %] 98 % (11/17 0435) Weight:  [62 kg] 62 kg (11/16 2148)  Intake/Output from previous day: 11/16 0701 - 11/17 0700 In: 2522.6 [P.O.:290; I.V.:1732.7; IV Piggyback:499.8] Out: 2775 [Urine:2525; Drains:200; Blood:50]  No results for input(s): WBC, HGB, HCT, PLT, K, CL, CO2, BUN, CREATININE, GLUCOSE, CALCIUM, LABPT, INR in the last 72 hours.  EXAM General: Well-developed well-nourished female seen in no apparent discomfort. Lungs: clear to auscultation Cardiac: normal rate and regular rhythm Abdomen: Soft, nontender, nondistended.  Bowel sounds are present. Right lower extremity: Dressing is dry and intact.  Polar Care and Hemovac are in place and functioning.  The patient is able to perform an independent straight leg raise.  Homans test is negative. Neurologic: Awake, alert, and oriented.  Sensory and motor function are intact.  Assessment: Right total knee arthroplasty  Secondary diagnoses: Hypertension Hyperlipidemia Degenerative disc disease lumbar spine Chronic kidney disease, stage II Osteopenia  Plan: Today's goals were reviewed with the patient.  Begin physical therapy and Occupational Therapy as per total knee arthroplasty rehab protocol. Plan is to go Home after hospital stay. DVT Prophylaxis - Lovenox, Foot Pumps and TED hose  James P. Holley Bouche M.D.

## 2019-11-05 NOTE — TOC Initial Note (Signed)
Transition of Care [facility]) - Initial/Assessment Note    Patient Details  Name: Rachel James MRN: [id number] Date of Birth: 05/25/1946  Transition of Care [facility]) CM/SW Contact:    Su Hilt, RN Phone Number: 11/05/2019, 2:59 PM  Clinical Narrative:                 Met with the patient to discuss DC plan and needs She lives alone and her niece will be staying with her for as long as needed She had DME at home including Shower chair BSC and RW She does not need additional DME She does want [place] services and I will need to find a [place] agency to find one to accept her insurance She stated that she could be set up with a meal service from her insurance company after DC form inpatient I explained I can call her insurance but I am not familiar with that service She was provided with the Lovenox price and I also provided her with a Good RX coupon to use for her Lovenox as the price with her insurance is over $180   Expected Discharge Plan: [place] Barriers to Discharge: Continued Medical Work up   Patient Goals and CMS Choice Patient states their goals for this hospitalization and ongoing recovery are:: go home CMS Medicare.gov Compare Post Acute Care list provided to:: Patient Choice offered to / list presented to : Patient  Expected Discharge Plan and Services Expected Discharge Plan: [place]   Discharge Planning Services: CM Consult   Living arrangements for the past 2 months: [place]                 DME Arranged: N/A         HH Arranged: PT          Prior Living Arrangements/Services Living arrangements for the past 2 months: [place] Lives with:: Self Patient language and need for interpreter reviewed:: Yes Do you feel safe going back to the place where you live?: Yes        Care giver support system in place?: Yes (comment) Current home services: DME(RW, BSC, Shower) Criminal  Activity/Legal Involvement Pertinent to Current Situation/Hospitalization: No - Comment as needed  Activities of Daily Living Home Assistive Devices/Equipment: None ADL Screening (condition at time of admission) Patient's cognitive ability adequate to safely complete daily activities?: Yes Is the patient deaf or have difficulty hearing?: No Does the patient have difficulty seeing, even when wearing glasses/contacts?: No Does the patient have difficulty concentrating, remembering, or making decisions?: No Patient able to express need for assistance with ADLs?: Yes Does the patient have difficulty dressing or bathing?: No Independently performs ADLs?: Yes (appropriate for developmental age) Does the patient have difficulty walking or climbing stairs?: No Weakness of Legs: None Weakness of Arms/Hands: None  Permission Sought/Granted   Permission granted to share information with : Yes, Verbal Permission Granted              Emotional Assessment Appearance:: Appears stated age Attitude/Demeanor/Rapport: Engaged Affect (typically observed): Accepting, Appropriate Orientation: : Oriented to Self, Oriented to Place, Oriented to  Time, Oriented to Situation Alcohol / Substance Use: Not Applicable Psych Involvement: Outpatient Provider  Admission diagnosis:  PRIMARY OSTEOARTHRITIS OF RIGHT KNEE. Patient Active Problem List   Diagnosis Date Noted  . Total knee replacement status 11/04/2019  . Cervical radiculitis 08/12/2019  . Dysfunction of Eustachian tube, left 08/12/2019  . Chronic  jaw pain 08/12/2019  . Primary osteoarthritis of right knee 03/17/2019  . Scoliosis (and kyphoscoliosis), idiopathic 03/17/2019  . Status post total shoulder arthroplasty 08/09/2018  . Hyponatremia 03/31/2018  . Chronic right shoulder pain 09/26/2017  . CKD (chronic kidney disease) stage 2, GFR 60-89 ml/min 07/11/2017  . Osteopenia 06/22/2017  . 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 Shoulder pain, bilateral 09/08/2016  . Need for prophylactic vaccination with Streptococcus pneumoniae (Pneumococcus) and Influenza vaccines 08/16/2016  . Urinary frequency 08/16/2016  . Cutaneous skin tags 04/18/2016  . Accidental fall 12/09/2015  . Hyperlipidemia LDL goal <100 09/17/2015  . Degenerative arthritis of lumbar spine with cord compression 07/16/2015  . Lumbar and sacral osteoarthritis 07/16/2015  . Bilateral change in hearing 05/27/2015  . Spinal stenosis, multilevel 05/27/2015  . Chronic radicular low back pain 05/27/2015  . Allergic rhinitis 01/06/2014  . Osteoarthrosis involving multiple sites 01/06/2014  . Arthropathia 01/06/2014  . Hypertension goal BP (blood pressure) < 140/90 01/06/2014  . Arthritis of right knee 01/06/2014   PCP:  Hubbard Hartshorn, FNP Pharmacy:   [id number] [id number]: [phone]: [phone]    Social Determinants of Health (SDOH) Interventions    Readmission Risk Interventions No flowsheet data found.

## 2019-11-05 NOTE — Evaluation (Signed)
Occupational Therapy Evaluation Patient Details Name: Rachel James MRN: [id number] DOB: 1946-07-17 Today's Date: 11/05/2019    History of Present Illness Pt admitted for R TKR.   Clinical Impression   Pt seen for OT evaluation this date, POD#1 from above surgery. Pt was independent in all ADL prior to surgery and is eager to return to PLOF with less pain and improved safety and independence. Pt lives alone but plans to have niece stay with her a couple days and will have PRN help from family and church/friends to bring food. Pt currently requires minimal assist for LB dressing and bathing while in seated position due to pain and limited AROM of R knee. Pt instructed in polar care mgt, falls prevention strategies, home/routines modifications, DME/AE for LB bathing and dressing tasks, and compression stocking mgt. Handout provided. Pt verbalized understanding of all instruction given. Denies additional needs at this time. Do not currently anticipate any additional skilled OT needs at this time. Will sign off; please re-consult if additional needs arise.      Follow Up Recommendations  No OT follow up    Equipment Recommendations  None recommended by OT    Recommendations for Other Services       Precautions / Restrictions Precautions Precautions: Fall;Knee Precaution Booklet Issued: Yes (comment) Restrictions Weight Bearing Restrictions: Yes RLE Weight Bearing: Weight bearing as tolerated      Mobility Bed Mobility Overal bed mobility: Needs Assistance Bed Mobility: Sit to Supine       Sit to supine: Min guard   General bed mobility comments: safe technique with only light assist required for moving R LE onto bed  Transfers Overall transfer level: Needs assistance Equipment used: Rolling walker (2 wheeled) Transfers: Sit to/from Stand Sit to Stand: Min guard         General transfer comment: safe technique with upright posture    Balance Overall balance  assessment: Needs assistance Sitting-balance support: Feet supported Sitting balance-Leahy Scale: Good     Standing balance support: Bilateral upper extremity supported Standing balance-Leahy Scale: Good                             ADL either performed or assessed with clinical judgement   ADL Overall ADL's : Needs assistance/impaired                                       General ADL Comments: Min A For LB ADL, CGA for functional mobility and transfers w/ RW     Vision Baseline Vision/History: Wears glasses Wears Glasses: Reading only Patient Visual Report: No change from baseline       Perception     Praxis      Pertinent Vitals/Pain Pain Assessment: No/denies pain Pain Score: 5  Pain Location: R knee Pain Descriptors / Indicators: Operative site guarding Pain Intervention(s): Limited activity within patient's tolerance;Ice applied;Premedicated before session     Hand Dominance Right   Extremity/Trunk Assessment Upper Extremity Assessment Upper Extremity Assessment: Overall WFL for tasks assessed   Lower Extremity Assessment Lower Extremity Assessment: RLE deficits/detail RLE Deficits / Details: expected post-op strength/ROM deficits       Communication Communication Communication: No difficulties   Cognition Arousal/Alertness: Awake/alert Behavior During Therapy: WFL for tasks assessed/performed Overall Cognitive Status: Within Functional Limits for tasks assessed  General Comments       Exercises Other Exercises Other Exercises: Pt instructed in compression stocking mgt, falls prevention, polar care mgt, AE/DME, and home/routines modifications with handout provided to support recall and carryover   Shoulder Instructions      Home Living Family/patient expects to be discharged to:: Private residence Living Arrangements: Alone Available Help at Discharge: Family;Other  (Comment)(niece planning to stay a couple days) Type of Home: House Home Access: Stairs to enter [employer] of Steps: 2-3 Entrance Stairs-Rails: None Home Layout: One level     Bathroom Shower/Tub: Teacher, early years/pre: Standard     Home Equipment: Bedside commode;Tub bench;Adaptive equipment Adaptive Equipment: Reacher        Prior Functioning/Environment Level of Independence: Independent        Comments: active and indep prior to admission. No falls        OT Problem List: Decreased strength;Decreased range of motion      OT Treatment/Interventions:      OT Goals(Current goals can be found in the care plan section) Acute Rehab OT Goals Patient Stated Goal: to go home OT Goal Formulation: All assessment and education complete, DC therapy  OT Frequency:     Barriers to D/C:            Co-evaluation              AM-PAC OT "6 Clicks" Daily Activity     Outcome Measure Help from another person eating meals?: None Help from another person taking care of personal grooming?: None Help from another person toileting, which includes using toliet, bedpan, or urinal?: A Little Help from another person bathing (including washing, rinsing, drying)?: A Little Help from another person to put on and taking off regular upper body clothing?: None Help from another person to put on and taking off regular lower body clothing?: A Little 6 Click Score: 21   End of Session    Activity Tolerance: Patient tolerated treatment well Patient left: in bed;with call bell/phone within reach;with bed alarm set;with SCD's reapplied;Other (comment)(polar care and blue bone foam in place)  OT Visit Diagnosis: Other abnormalities of gait and mobility (R26.89)                Time: [id number] OT Time Calculation (min): 20 min Charges:  OT General Charges $OT Visit: 1 Visit OT Evaluation $OT Eval Low Complexity: 1 Low OT Treatments $Self Care/Home Management  : 8-22 mins  Jeni Salles, MPH, MS, OTR/L ascom [phone] 11/05/19, 2:27 PM

## 2019-11-05 NOTE — TOC Progression Note (Signed)
Transition of Care [facility]) - Progression Note    Patient Details  Name: Rachel James MRN: [id number] Date of Birth: 1946-08-13  Transition of Care [facility]) CM/SW Contact  Su Hilt, RN Phone Number: 11/05/2019, 4:06 PM  Clinical Narrative:    [facility] is unable to accept the patient, [name] with [facility] has accepted the patient    Expected Discharge Plan: [place] Barriers to Discharge: Continued Medical Work up  Expected Discharge Plan and Services Expected Discharge Plan: [place]   Discharge Planning Services: CM Consult   Living arrangements for the past 2 months: [place]                 DME Arranged: N/A         HH Arranged: PT           Social Determinants of Health (SDOH) Interventions    Readmission Risk Interventions No flowsheet data found.

## 2019-11-05 NOTE — TOC Benefit Eligibility Note (Signed)
Transition of Care [facility]) Benefit Eligibility Note    Patient Details  Name: Rachel James MRN: [id number] Date of Birth: 1946-12-17   Medication/Dose: Enoxaparin 40mg  once daily for 14 days  Covered?: Yes  Prescription Coverage Preferred Pharmacy: [facility]  Spoke with Person/Company/Phone Number:: [name] with [employer] at [phone]  Co-Pay: $185.37 estimated copay  Prior Approval: No  Deductible: ($250 deductible, rep unable to disclose accumlation amount.)   [name] Phone Number: [phone] or [phone] 11/05/2019, 1:36 PM

## 2019-11-05 NOTE — Progress Notes (Signed)
Physical Therapy Treatment Patient Details Name: Rachel James MRN: [id number] DOB: [birth year] Today's Date: 11/05/2019    History of Present Illness Pt admitted for R TKR.    PT Comments    Pt is making good progress towards goals with ability to navigate RN station this date, improving in reciprocal gait pattern and demonstrating upright posture. Gave and reviewed written HEP. Pt very motivated to participate. Will continue to progress as able.   Follow Up Recommendations  Home health PT     Equipment Recommendations  (has correct RW)    Recommendations for Other Services       Precautions / Restrictions Precautions Precautions: Fall;Knee Precaution Booklet Issued: Yes (comment) Restrictions Weight Bearing Restrictions: Yes RLE Weight Bearing: Weight bearing as tolerated    Mobility  Bed Mobility Overal bed mobility: Needs Assistance Bed Mobility: Sit to Supine     Supine to sit: Min guard Sit to supine: Min guard   General bed mobility comments: safe technique with only light assist required for moving R LE onto bed  Transfers Overall transfer level: Needs assistance Equipment used: Rolling walker (2 wheeled) Transfers: Sit to/from Stand Sit to Stand: Min guard         General transfer comment: safe technique with upright posture  Ambulation/Gait Ambulation/Gait assistance: Min guard Gait Distance (Feet): 200 Feet Assistive device: Rolling walker (2 wheeled) Gait Pattern/deviations: Step-through pattern     General Gait Details: ambulated around RN station. Progressed to reciprocal gait pattern and fluid gait sequencing. Good WBing noted throughout surgical leg. Does report increased pain with exertion   Stairs             Wheelchair Mobility    Modified Rankin (Stroke Patients Only)       Balance Overall balance assessment: Needs assistance Sitting-balance support: Feet supported Sitting balance-Leahy Scale: Good     Standing  balance support: Bilateral upper extremity supported Standing balance-Leahy Scale: Good                              Cognition Arousal/Alertness: Awake/alert Behavior During Therapy: WFL for tasks assessed/performed Overall Cognitive Status: Within Functional Limits for tasks assessed                                        Exercises Total Joint Exercises Goniometric ROM: R knee AAROM: 0-73 degrees Other Exercises Other Exercises: supine ther-ex performed with cga including R LE AP, quad sets, SLRs, heel slides, SAQ, and hip abd/add. Safe technique with minimal pain reported. 10 reps. Reviewed written HEP    General Comments        Pertinent Vitals/Pain Pain Assessment: 0-10 Pain Score: 5  Pain Location: R knee Pain Descriptors / Indicators: Operative site guarding Pain Intervention(s): Limited activity within patient's tolerance;Ice applied;Premedicated before session    Home Living Family/patient expects to be discharged to:: Private residence Living Arrangements: Alone Available Help at Discharge: (will have niece to stay with her post dc) Type of Home: House Home Access: Stairs to enter Entrance Stairs-Rails: None Home Layout: One level Home Equipment: Bedside commode;Shower seat      Prior Function Level of Independence: Independent      Comments: active and indep prior to admission. No falls   PT Goals (current goals can now be found in the care plan section) Acute Rehab PT Goals  Patient Stated Goal: to go home PT Goal Formulation: With patient Time For Goal Achievement: 11/19/19 Potential to Achieve Goals: Good Additional Goals Additional Goal #1: Pt will be able to perform bed mobility/transfers with supervision and LRAD in order to improve functional independence Progress towards PT goals: Progressing toward goals    Frequency    BID      PT Plan Current plan remains appropriate    Co-evaluation               AM-PAC PT "6 Clicks" Mobility   Outcome Measure  Help needed turning from your back to your side while in a flat bed without using bedrails?: A Little Help needed moving from lying on your back to sitting on the side of a flat bed without using bedrails?: A Little Help needed moving to and from a bed to a chair (including a wheelchair)?: A Little Help needed standing up from a chair using your arms (e.g., wheelchair or bedside chair)?: A Little Help needed to walk in hospital room?: A Little Help needed climbing 3-5 steps with a railing? : A Little 6 Click Score: 18    End of Session Equipment Utilized During Treatment: Gait belt Activity Tolerance: Patient tolerated treatment well Patient left: in bed;with bed alarm set;with SCD's reapplied Nurse Communication: Mobility status PT Visit Diagnosis: Muscle weakness (generalized) (M62.81);Difficulty in walking, not elsewhere classified (R26.2);Pain Pain - Right/Left: Right Pain - part of body: Knee     Time: [id number] PT Time Calculation (min) (ACUTE ONLY): 28 min  Charges:  $Gait Training: 8-22 mins $Therapeutic Exercise: 8-22 mins                     Greggory Stallion, PT, DPT [phone]    Rachel James 11/05/2019, 1:48 PM

## 2019-11-05 NOTE — Anesthesia Postprocedure Evaluation (Signed)
Anesthesia Post Note  Patient: Rachel James  Procedure(s) Performed: COMPUTER ASSISTED TOTAL KNEE ARTHROPLASTY (Right Knee)  Patient location during evaluation: Nursing Unit Anesthesia Type: Spinal Level of consciousness: oriented and awake and alert Pain management: pain level controlled Vital Signs Assessment: post-procedure vital signs reviewed and stable Respiratory status: spontaneous breathing and respiratory function stable Cardiovascular status: blood pressure returned to baseline and stable Postop Assessment: no headache, no backache, no apparent nausea or vomiting and patient able to bend at knees Anesthetic complications: no     Last Vitals:  Vitals:   11/05/19 0014 11/05/19 0435  BP: (!) 113/52 138/87  Pulse: 71 69  Resp: 18   Temp: 36.7 C 36.5 C  SpO2: 96% 98%    Last Pain:  Vitals:   11/05/19 0622  TempSrc:   PainSc: 4                  Caryl Asp

## 2019-11-06 MED ORDER — ENOXAPARIN SODIUM 40 MG/0.4ML ~~LOC~~ SOLN: 40.0000 mg | SUBCUTANEOUS | 0 refills | Status: DC

## 2019-11-06 MED ORDER — CELECOXIB 200 MG PO CAPS: 200.0000 mg | ORAL_CAPSULE | Freq: Two times a day (BID) | ORAL | 0 refills | Status: DC

## 2019-11-06 MED ORDER — TRAMADOL HCL 50 MG PO TABS: 50.0000 mg | ORAL_TABLET | ORAL | 0 refills | Status: DC | PRN

## 2019-11-06 MED ORDER — OXYCODONE HCL 5 MG PO TABS: 5.0000 mg | ORAL_TABLET | ORAL | 0 refills | Status: DC | PRN

## 2019-11-06 NOTE — Progress Notes (Signed)
Discharge instructions given. Pt verbalizes understanding. Pt awaiting niece to arrive.

## 2019-11-06 NOTE — TOC Transition Note (Signed)
Transition of Care [facility]) - CM/SW Discharge Note   Patient Details  Name: Rachel James MRN: [id number] Date of Birth: [birth year]  [facility] CM/SW Contact:  Su Hilt, RN Phone Number: 11/06/2019, 9:34 AM   Clinical Narrative:    Patient is discharging home today with [facility] for [facility] services, she has all the DME at home and does not need more, she was provided with a Good RX card for the Lovenox No additional needs   Final next level of care: Home w Home Health Services Barriers to Discharge: Barriers Resolved   Patient Goals and CMS Choice Patient states their goals for this hospitalization and ongoing recovery are:: go home CMS Medicare.gov Compare Post Acute Care list provided to:: Patient Choice offered to / list presented to : Patient  Discharge Placement                       Discharge Plan and Services   Discharge Planning Services: CM Consult            DME Arranged: N/A         HH Arranged: PT          Social Determinants of Health (SDOH) Interventions     Readmission Risk Interventions No flowsheet data found.

## 2019-11-06 NOTE — Progress Notes (Signed)
Physical Therapy Treatment Patient Details Name: Rachel James MRN: [id number] DOB: 11/06/1946 Today's Date: 11/06/2019    History of Present Illness Pt admitted for R TKR.    PT Comments    Pt is making good progress towards goals and is ready to dc from the hospital this date. Pt able to ambulate around RN station with RW and performed stair training this date. Good endurance with ROM/ther-ex. Educated on written HEP. Pt continues to be motivated to participate.   Follow Up Recommendations  Home health PT     Equipment Recommendations  None recommended by PT    Recommendations for Other Services       Precautions / Restrictions Precautions Precautions: Fall;Knee Precaution Booklet Issued: Yes (comment) Restrictions Weight Bearing Restrictions: Yes RLE Weight Bearing: Weight bearing as tolerated    Mobility  Bed Mobility               General bed mobility comments: not performed as pt received in recliner  Transfers Overall transfer level: Needs assistance Equipment used: Rolling walker (2 wheeled) Transfers: Sit to/from Stand Sit to Stand: Supervision         General transfer comment: safe technique with upright posture  Ambulation/Gait Ambulation/Gait assistance: Supervision Gait Distance (Feet): 250 Feet Assistive device: Rolling walker (2 wheeled) Gait Pattern/deviations: Step-through pattern     General Gait Details: ambulated around RN station with improved reciprocal gait pattern. Still demonstrates antalgic gait pattern. Upright posture noted   Stairs Stairs: Yes Stairs assistance: Min guard Stair Management: No rails;With walker Number of Stairs: 2 General stair comments: Pt navigated stairs twice using RW with cga. Safe technique with therapist providing demonstration prior to performance   Wheelchair Mobility    Modified Rankin (Stroke Patients Only)       Balance Overall balance assessment: Needs assistance Sitting-balance  support: Feet supported Sitting balance-Leahy Scale: Good     Standing balance support: Bilateral upper extremity supported Standing balance-Leahy Scale: Good                              Cognition Arousal/Alertness: Awake/alert Behavior During Therapy: WFL for tasks assessed/performed Overall Cognitive Status: Within Functional Limits for tasks assessed                                        Exercises Total Joint Exercises Goniometric ROM: R knee AAROM: 0-98 degrees Other Exercises Other Exercises: seated ther-ex performed with cga including R LE AP, quad sets, SLRs, SAQ, LAQ, knee flexion stretches, and hip abd/add. Safe technique with minimal pain reported. 12 reps. Reviewed written HEP Other Exercises: ambulated to bathroom with supervision, left in bathroom with instructions to pull cord when ready    General Comments        Pertinent Vitals/Pain Pain Assessment: 0-10 Pain Score: 4  Pain Location: R knee Pain Descriptors / Indicators: Operative site guarding Pain Intervention(s): Limited activity within patient's tolerance;Ice applied    Home Living                      Prior Function            PT Goals (current goals can now be found in the care plan section) Acute Rehab PT Goals Patient Stated Goal: to go home PT Goal Formulation: With patient Time For Goal Achievement: 11/19/19  Potential to Achieve Goals: Good Progress towards PT goals: Progressing toward goals    Frequency    BID      PT Plan Current plan remains appropriate    Co-evaluation              AM-PAC PT "6 Clicks" Mobility   Outcome Measure  Help needed turning from your back to your side while in a flat bed without using bedrails?: A Little Help needed moving from lying on your back to sitting on the side of a flat bed without using bedrails?: A Little Help needed moving to and from a bed to a chair (including a wheelchair)?: A  Little Help needed standing up from a chair using your arms (e.g., wheelchair or bedside chair)?: A Little Help needed to walk in hospital room?: A Little Help needed climbing 3-5 steps with a railing? : A Little 6 Click Score: 18    End of Session Equipment Utilized During Treatment: Gait belt Activity Tolerance: Patient tolerated treatment well Patient left: (left in bathroom) Nurse Communication: Mobility status PT Visit Diagnosis: Muscle weakness (generalized) (M62.81);Difficulty in walking, not elsewhere classified (R26.2);Pain Pain - Right/Left: Right Pain - part of body: Knee     Time: [id number] PT Time Calculation (min) (ACUTE ONLY): 30 min  Charges:  $Gait Training: 8-22 mins $Therapeutic Exercise: 8-22 mins                     Greggory Stallion, PT, DPT [phone]    Rachel James 11/06/2019, 10:48 AM

## 2019-11-06 NOTE — Progress Notes (Signed)
Patient being discharged from unit today. All personal belongings with patient.  Patient offers no s/sx of distress.  Two dressings sent home with patient as requested by MD.   All patient  Discharge information reviewed with patient inclusive of medications and follow up appts.  Ice-man pack home with patient.

## 2019-11-06 NOTE — Progress Notes (Signed)
  Subjective: 2 Days Post-Op Procedure(s) (LRB): COMPUTER ASSISTED TOTAL KNEE ARTHROPLASTY (Right) Patient reports pain as well-controlled.   Patient is well, and has had no acute complaints or problems Plan is to go Home after hospital stay. Negative for chest pain and shortness of breath Fever: no Gastrointestinal: negative for nausea and vomiting.  Patient has not had a bowel movement.  Objective: Vital signs in last 24 hours: Temp:  [97.6 F (36.4 C)-98.5 F (36.9 C)] 98 F (36.7 C) (11/18 0423) Pulse Rate:  [58-65] 58 (11/18 0423) Resp:  [18] 18 (11/17 1519) BP: (110-137)/(65-80) 110/71 (11/18 0423) SpO2:  [94 %-100 %] 99 % (11/18 0423)  Intake/Output from previous day:  Intake/Output Summary (Last 24 hours) at 11/06/2019 0744 Last data filed at 11/06/2019 0604 Gross per 24 hour  Intake 1871.44 ml  Output 180 ml  Net 1691.44 ml    Intake/Output this shift: No intake/output data recorded.  Labs: No results for input(s): HGB in the last 72 hours. No results for input(s): WBC, RBC, HCT, PLT in the last 72 hours. No results for input(s): NA, K, CL, CO2, BUN, CREATININE, GLUCOSE, CALCIUM in the last 72 hours. No results for input(s): LABPT, INR in the last 72 hours.   EXAM General - Patient is Alert, Appropriate and Oriented Extremity - Neurovascular intact Dorsiflexion/Plantar flexion intact Compartment soft Dressing/Incision -Postoperative dressing remains in place., Polar Care in place and working. , Hemovac in place.  Following dressing removal, moderate sanguinous drainage noted. Motor Function - intact, moving foot and toes well on exam.  Able to perform straight leg raise. Cardiovascular- Regular rate and rhythm, no murmurs/rubs/gallops Respiratory- Lungs clear to auscultation bilaterally Gastrointestinal- soft, nontender and active bowel sounds   Assessment/Plan: 2 Days Post-Op Procedure(s) (LRB): COMPUTER ASSISTED TOTAL KNEE ARTHROPLASTY (Right) Active  Problems:   Total knee replacement status  Estimated body mass index is 24.21 kg/m as calculated from the following:   Height as of this encounter: 5\' 3"  (1.6 m).   Weight as of this encounter: 62 kg. Advance diet Up with therapy Discharge home with home health  Postoperative dressing and Hemovac removed.  Fresh honeycomb dressing applied.  DVT Prophylaxis - Lovenox, Ted hose and foot pumps Weight-Bearing as tolerated to right leg  Cassell Smiles, PA-C [facility] 11/06/2019, 7:44 AM

## 2019-11-06 NOTE — Discharge Summary (Signed)
Physician Discharge Summary  Patient ID: Rachel James MRN: [id number] DOB/AGE: [birth year] 73 y.o.  Admit date: 11/04/2019 Discharge date: 11/06/2019  Admission Diagnoses:  PRIMARY OSTEOARTHRITIS OF RIGHT KNEE.  Surgeries:Procedure(s):  Right total knee arthroplasty using computer-assisted navigation  SURGEON:  Rachel James. M.D.  ASSISTANT: Rachel Smiles, PA-C (present and scrubbed throughout the case, critical for assistance with exposure, retraction, instrumentation, and closure)  ANESTHESIA: spinal  ESTIMATED BLOOD LOSS: 50 mL  FLUIDS REPLACED: 1000 mL of crystalloid  TOURNIQUET TIME: 81 minutes  DRAINS: 2 medium Hemovac drains  SOFT TISSUE RELEASES: Anterior cruciate ligament, posterior cruciate ligament, deep medial collateral ligament, patellofemoral ligament  IMPLANTS UTILIZED: DePuy Attune size 7 posterior stabilized femoral component (cemented), size 6 rotating platform tibial component (cemented), 35 mm medialized dome patella (cemented), and a 5 mm stabilized rotating platform polyethylene insert.  Discharge Diagnoses: Patient Active Problem List   Diagnosis Date Noted  . Total knee replacement status 11/04/2019  . Cervical radiculitis 08/12/2019  . Dysfunction of Eustachian tube, left 08/12/2019  . Chronic jaw pain 08/12/2019  . Primary osteoarthritis of right knee 03/17/2019  . Scoliosis (and kyphoscoliosis), idiopathic 03/17/2019  . Status post total shoulder arthroplasty 08/09/2018  . Hyponatremia 03/31/2018  . Chronic right shoulder pain 09/26/2017  . CKD (chronic kidney disease) stage 2, GFR 60-89 ml/min 07/11/2017  . Osteopenia 06/22/2017  . 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 Shoulder pain, bilateral 09/08/2016  . Need for prophylactic vaccination with Streptococcus pneumoniae (Pneumococcus) and Influenza vaccines 08/16/2016  . Urinary frequency 08/16/2016  . Cutaneous skin tags 04/18/2016  . Accidental fall  12/09/2015  . Hyperlipidemia LDL goal <100 09/17/2015  . Degenerative arthritis of lumbar spine with cord compression 07/16/2015  . Lumbar and sacral osteoarthritis 07/16/2015  . Bilateral change in hearing 05/27/2015  . Spinal stenosis, multilevel 05/27/2015  . Chronic radicular low back pain 05/27/2015  . Allergic rhinitis 01/06/2014  . Osteoarthrosis involving multiple sites 01/06/2014  . Arthropathia 01/06/2014  . Hypertension goal BP (blood pressure) < 140/90 01/06/2014  . Arthritis of right knee 01/06/2014    Past Medical History:  Diagnosis Date  . Allergy   . Arthritis    knees, lower back  . Cervical radiculitis   . Chronic radicular low back pain   . CKD (chronic kidney disease) stage 2, GFR 60-89 ml/min 07/11/2017  . Degenerative disc disease, lumbar   . Hyperlipidemia LDL goal <100   . Hypertension   . Osteopenia 06/22/2017   June 2016 DEXA  . PONV (postoperative nausea and vomiting)    in past.  none recently     Transfusion:    Consultants (if any):   Discharged Condition: Improved  Hospital Course: Rachel James is an 73 y.o. female who was admitted 11/04/2019 with a diagnosis of right knee osteoarthritis and went to the operating room on 11/04/2019 and underwent right total knee arthroplasty. The patient received perioperative antibiotics for prophylaxis (see below). The patient tolerated the procedure well and was transported to PACU in stable condition. After meeting PACU criteria, the patient was subsequently transferred to the Orthopaedics/Rehabilitation unit.   The patient received DVT prophylaxis in the form of early mobilization, Lovenox, Foot Pumps and TED hose. A sacral pad had been placed and heels were elevated off of the bed with rolled towels in order to protect skin integrity. Foley catheter was discontinued on postoperative day #1. Wound drains were discontinued on postoperative day #2. The surgical incision was  healing well without signs of  infection.  Physical therapy was initiated postoperatively for transfers, gait training, and strengthening. Occupational therapy was initiated for activities of daily living and evaluation for assisted devices. Rehabilitation goals were reviewed in detail with the patient. The patient made steady progress with physical therapy and physical therapy recommended discharge to Home.   The patient achieved his preliminary goals of this hospitalization and was felt to be medically and orthopaedically appropriate for discharge.  She was given perioperative antibiotics:  Anti-infectives (From admission, onward)   Start     Dose/Rate Route Frequency Ordered Stop   11/04/19 1800  ceFAZolin (ANCEF) IVPB 2g/100 mL premix     2 g 200 mL/hr over 30 Minutes Intravenous Every 6 hours 11/04/19 1656 11/05/19 1315   11/04/19 1000  ceFAZolin (ANCEF) IVPB 2g/100 mL premix     2 g 200 mL/hr over 30 Minutes Intravenous On call to O.R. 11/04/19 [id number] 11/04/19 1208    .  Recent vital signs:  Vitals:   11/05/19 2108 11/06/19 0423  BP: 114/65 110/71  Pulse: 64 (!) 58  Resp:    Temp: 97.6 F (36.4 C) 98 F (36.7 C)  SpO2: 94% 99%    Recent laboratory studies:  No results for input(s): WBC, HGB, HCT, PLT, K, CL, CO2, BUN, CREATININE, GLUCOSE, CALCIUM, LABPT, INR in the last 72 hours.  Diagnostic Studies: Dg Bone Density  Result Date: 10/28/2019 EXAM: DUAL X-RAY ABSORPTIOMETRY (DXA) FOR BONE MINERAL DENSITY IMPRESSION: Technologist:VLM Your patient Rachel James a BMD test on 10/28/2019 using the [place] (analysis version: 14.10) manufactured by [employer]. The following summarizes the results of our evaluation. PATIENT BIOGRAPHICAL: Name: Rachel James Patient ID: [id number] Birth Date: 01-25-1946 Height:     62.0 in. Gender: Female Exam Date: 10/28/2019 Weight:     139.0 lbs. Indications: Caucasian, Height Loss, Kidney Disease, Osteoarthritis, Osteopenia Fractures: Treatments:  Calcium, Multi-Vitamin ASSESSMENT: The BMD measured at Femur Total Right is 0.757 g/cm2 with a T-score of -2.0. This patient is considered OSTEOPENIC according to [facility]) criteria. Lumbar spine was not utilized due to advanced degenerative changes. The scan quality is good. Site Region Measured Measured WHO Young Adult BMD Date       Age      Classification T-score DualFemur Total Right 10/28/2019 71.6 Osteopenia -2.0 0.757 g/cm2 DualFemur Total Right 10/26/2017 69.6 Osteopenia -1.8 0.777 g/cm2 DualFemur Total Right 07/22/2015 67.3 Osteopenia -1.5 0.817 g/cm2 DualFemur Total Right 06/17/2015 67.3 Osteopenia -1.7 0.794 g/cm2 DualFemur Total Mean 10/28/2019 71.6 Osteopenia -1.9 0.773 g/cm2 DualFemur Total Mean 10/26/2017 69.6 Osteopenia -1.7 0.788 g/cm2 DualFemur Total Mean 07/22/2015 67.3 Osteopenia -1.3 0.848 g/cm2 DualFemur Total Mean 06/17/2015 67.3 Osteopenia -1.6 0.805 g/cm2 Left Forearm Radius 33% 10/28/2019 71.6 Osteopenia -1.9 0.707 g/cm2 Left Forearm Radius 33% 10/26/2017 69.6 Osteopenia -1.8 0.717 g/cm2 Left Forearm Radius 33% 07/22/2015 67.3 Normal -1.0 0.793 g/cm2 Left Forearm Radius 33% 06/17/2015 67.3 Osteopenia -1.6 0.733 g/cm2 World Health Organization [facility]) criteria for post-menopausal, Caucasian Women: Normal:       T-score at or above -1 SD Osteopenia:   T-score between -1 and -2.5 SD Osteoporosis: T-score at or below -2.5 SD RECOMMENDATIONS: 1. All patients should optimize calcium and vitamin D intake. 2. Consider FDA-approved medical therapies in postmenopausal women and men aged 46 years and older, based on the following: a. A hip or vertebral(clinical or morphometric) fracture b. T-score < -2.5 at the femoral neck or spine after appropriate evaluation to exclude  secondary causes c. Low bone mass (T-score between -1.0 and -2.5 at the femoral neck or spine) and a 10-year probability of a hip fracture > 3% or a 10-year probability of a major osteoporosis-related fracture >  20% based on the [place]-adapted WHO algorithm d. Clinician judgment and/or patient preferences may indicate treatment for people with 10-year fracture probabilities above or below these levels FOLLOW-UP: People with diagnosed cases of osteoporosis or at high risk for fracture should have regular bone mineral density tests. For patients eligible for Medicare, routine testing is allowed once every 2 years. The testing frequency can be increased to one year for patients who have rapidly progressing disease, those who are receiving or discontinuing medical therapy to restore bone mass, or have additional risk factors. I have reviewed this report, and agree with the above findings. Mark A. Thornton Papas, M.D. [facility] Dear Dr. Uvaldo Rising, Your patient SHALEEN LONGDEN James a FRAX assessment on 10/28/2019 using the [place] (analysis version: 14.10) manufactured by [employer]. The following summarizes the results of our evaluation. PATIENT BIOGRAPHICAL: Name: Vasilia, Harvill Patient ID: [id number] Birth Date: 1946-03-16 Height:    62.0 in. Gender:     Female    [age]       Weight:    139.0 lbs. Ethnicity:  White                            Exam Date: 10/28/2019 FRAX* RESULTS:  (version: 3.5) 10-year Probability of Fracture1 Major Osteoporotic Fracture2 Hip Fracture 11.4% 2.2% Population: [place] (Caucasian) Risk Factors: None Based on DualFemur (Right) Neck BMD 1 -The 10-year probability of fracture may be lower than reported if the patient has received treatment. 2 -Major Osteoporotic Fracture: Clinical Spine, Forearm, Hip or Shoulder *FRAX is a Materials engineer of the [employer] for Metabolic Bone Disease, a [place] (WHO) [facility]. ASSESSMENT: The probability of a major osteoporotic fracture is 11.4% within the next ten years. The probability of a hip fracture is 2.2% within the next ten years. I have reviewed this report and agree with the  above findings. Mark A. Thornton Papas, M.D. [facility] Electronically Signed   By: Lavonia Dana M.D.   On: 10/28/2019 11:24   Dg Knee Right Port  Result Date: 11/04/2019 CLINICAL DATA:  Right knee replacement EXAM: PORTABLE RIGHT KNEE - 1-2 VIEW COMPARISON:  None. FINDINGS: Post right total knee arthroplasty. Drain is present. Alignment is anatomic. IMPRESSION: Standard postoperative appearance of right total knee arthroplasty. Electronically Signed   By: Macy Mis M.D.   On: 11/04/2019 15:47    Discharge Medications:   Allergies as of 11/06/2019      Reactions   Codeine Itching, Other (See Comments)   nightmare   Oxycodone Itching      Medication List    STOP taking these medications   aspirin 81 MG tablet   naproxen sodium 220 MG tablet Commonly known as: ALEVE     TAKE these medications   acetaminophen 500 MG tablet Commonly known as: TYLENOL Take 1,000 mg by mouth 2 (two) times daily.   amoxicillin 500 MG capsule Commonly known as: AMOXIL Take 500 mg by mouth once. Prior to dental appt   carbamide peroxide 6.5 % OTIC solution Commonly known as: DEBROX 5 drops 2 (two) times daily as needed.   celecoxib 200 MG capsule Commonly known as:  CELEBREX Take 1 capsule (200 mg total) by mouth 2 (two) times daily.   cholecalciferol 1000 units tablet Commonly known as: VITAMIN D Take 1 tablet (1,000 Units total) by mouth daily.   docusate sodium 100 MG capsule Commonly known as: COLACE Take 100 mg by mouth daily.   enoxaparin 40 MG/0.4ML injection Commonly known as: LOVENOX Inject 0.4 mLs (40 mg total) into the skin daily for 14 days.   fluticasone 50 MCG/ACT nasal spray Commonly known as: FLONASE Place 1 spray into both nostrils daily.   hydrochlorothiazide 12.5 MG capsule Commonly known as: MICROZIDE Take 1 capsule (12.5 mg total) by mouth daily.   losartan 100 MG tablet Commonly known as: COZAAR Take 1 tablet (100 mg total) by mouth daily. What  changed: when to take this   Lysine 500 MG Tabs Take by mouth 2 (two) times daily as needed.   magnesium oxide 400 MG tablet Commonly known as: MAG-OX Take 400 mg by mouth daily.   Magnesium Oxide 500 MG Tabs Take 1 tablet by mouth daily.   metoprolol succinate 100 MG 24 hr tablet Commonly known as: TOPROL-XL Take 1 tablet (100 mg total) by mouth daily. Take with or immediately following a meal.   MULTIVITAMIN PO Take 1 tablet by mouth daily.   oxyCODONE 5 MG immediate release tablet Commonly known as: Oxy IR/ROXICODONE Take 1 tablet (5 mg total) by mouth every 4 (four) hours as needed for moderate pain (pain score 4-6).   oxymetazoline 0.05 % nasal spray Commonly known as: AFRIN Place 1 spray into both nostrils 2 (two) times daily as needed for congestion.   ranitidine 150 MG capsule Commonly known as: ZANTAC Take 150 mg by mouth 2 (two) times daily as needed for heartburn.   simvastatin 10 MG tablet Commonly known as: ZOCOR Take 1 tablet (10 mg total) by mouth daily. What changed: when to take this   tiZANidine 4 MG tablet Commonly known as: Zanaflex Take 1 tablet (4 mg total) by mouth every 8 (eight) hours as needed for muscle spasms.   traMADol 50 MG tablet Commonly known as: ULTRAM Take 1 tablet (50 mg total) by mouth every 4 (four) hours as needed for moderate pain.            Durable Medical Equipment  (From admission, onward)         Start     Ordered   11/04/19 1657  DME Walker rolling  Once    Question:  Patient needs a walker to treat with the following condition  Answer:  Total knee replacement status   11/04/19 1656   11/04/19 1657  DME Bedside commode  Once    Question:  Patient needs a bedside commode to treat with the following condition  Answer:  Total knee replacement status   11/04/19 1656          Disposition: Home with home health     Follow-up Information    [place] Heir On 11/19/2019.   Specialty: Orthopedic  Surgery Why: at 1;45pm Contact information: [place] [address] [phone]        Dereck Leep, MD On 12/17/2019.   Specialty: Orthopedic Surgery Why: at 10:45am Contact information: [place] [address] [place], PA-C 11/06/2019, 7:50 AM

## 2019-11-08 ENCOUNTER — Telehealth: Payer: Self-pay | Admitting: Emergency Medicine

## 2019-11-08 NOTE — Telephone Encounter (Signed)
Please call Dr. Clydell Hakim office.  I do not typically order or manage home care after orthopedic procedures.  This is usually from the orthopedist.  If she needs hospital follow up appt with me, please schedule with me ASAP.

## 2019-11-08 NOTE — Telephone Encounter (Signed)
Would like referral 

## 2019-11-08 NOTE — Telephone Encounter (Signed)
Copied from [place] [phone]. Topic: General - Other >> Nov 07, 2019  2:03 PM Greggory Keen D wrote: Reason for CRM: pt was discharged from [facility] yesterday and they told her she would need in home nursing care.  They told her that her primary care would have to place an order.  They gave her [facility] and [facility] were two places that she could use.  Call Back [phone]

## 2019-11-09 DIAGNOSIS — M48061 Spinal stenosis, lumbar region without neurogenic claudication: Secondary | ICD-10-CM | POA: Diagnosis not present

## 2019-11-09 DIAGNOSIS — Z471 Aftercare following joint replacement surgery: Secondary | ICD-10-CM | POA: Diagnosis not present

## 2019-11-09 DIAGNOSIS — M169 Osteoarthritis of hip, unspecified: Secondary | ICD-10-CM | POA: Diagnosis not present

## 2019-11-09 DIAGNOSIS — N182 Chronic kidney disease, stage 2 (mild): Secondary | ICD-10-CM | POA: Diagnosis not present

## 2019-11-09 DIAGNOSIS — I129 Hypertensive chronic kidney disease with stage 1 through stage 4 chronic kidney disease, or unspecified chronic kidney disease: Secondary | ICD-10-CM | POA: Diagnosis not present

## 2019-11-09 DIAGNOSIS — M5412 Radiculopathy, cervical region: Secondary | ICD-10-CM | POA: Diagnosis not present

## 2019-11-09 DIAGNOSIS — G9520 Unspecified cord compression: Secondary | ICD-10-CM | POA: Diagnosis not present

## 2019-11-09 DIAGNOSIS — G8929 Other chronic pain: Secondary | ICD-10-CM | POA: Diagnosis not present

## 2019-11-09 DIAGNOSIS — M5136 Other intervertebral disc degeneration, lumbar region: Secondary | ICD-10-CM | POA: Diagnosis not present

## 2019-11-09 DIAGNOSIS — M47817 Spondylosis without myelopathy or radiculopathy, lumbosacral region: Secondary | ICD-10-CM | POA: Diagnosis not present

## 2019-11-11 ENCOUNTER — Other Ambulatory Visit: Payer: Self-pay | Admitting: *Deleted

## 2019-11-11 DIAGNOSIS — M5412 Radiculopathy, cervical region: Secondary | ICD-10-CM | POA: Diagnosis not present

## 2019-11-11 DIAGNOSIS — M48061 Spinal stenosis, lumbar region without neurogenic claudication: Secondary | ICD-10-CM | POA: Diagnosis not present

## 2019-11-11 DIAGNOSIS — I129 Hypertensive chronic kidney disease with stage 1 through stage 4 chronic kidney disease, or unspecified chronic kidney disease: Secondary | ICD-10-CM | POA: Diagnosis not present

## 2019-11-11 DIAGNOSIS — M47817 Spondylosis without myelopathy or radiculopathy, lumbosacral region: Secondary | ICD-10-CM | POA: Diagnosis not present

## 2019-11-11 DIAGNOSIS — Z471 Aftercare following joint replacement surgery: Secondary | ICD-10-CM | POA: Diagnosis not present

## 2019-11-11 DIAGNOSIS — G9520 Unspecified cord compression: Secondary | ICD-10-CM | POA: Diagnosis not present

## 2019-11-11 DIAGNOSIS — M5136 Other intervertebral disc degeneration, lumbar region: Secondary | ICD-10-CM | POA: Diagnosis not present

## 2019-11-11 DIAGNOSIS — G8929 Other chronic pain: Secondary | ICD-10-CM | POA: Diagnosis not present

## 2019-11-11 DIAGNOSIS — M169 Osteoarthritis of hip, unspecified: Secondary | ICD-10-CM | POA: Diagnosis not present

## 2019-11-11 DIAGNOSIS — N182 Chronic kidney disease, stage 2 (mild): Secondary | ICD-10-CM | POA: Diagnosis not present

## 2019-11-11 NOTE — Telephone Encounter (Signed)
Patient notified. Orthopedic already set up in home care

## 2019-11-11 NOTE — Patient Outreach (Signed)
[place] [facility]) Care Management  11/11/2019  CHENEY PATMAN Apr 28, 1946 [id number]   Subjective: Telephone call to patient's home  number, no answer, left HIPAA compliant voicemail message, and requested call back.   Objective: Per KPN (Knowledge Performance Now, point of care tool) and chart review, patient hospitalized 11/04/2019 - 11/06/2019 for PRIMARY OSTEOARTHRITIS OF RIGHT KNEE, status post Righttotal knee arthroplasty using computer-assisted navigation on 11/04/2019 .  Patient also has a history of hypertension, Cervical radiculitis, Dysfunction of Eustachian tube, left, Chronic jaw pain, Scoliosis (and kyphoscoliosis), idiopathic, Status post total shoulder arthroplasty (08/09/2018), Chronic right shoulder pain, CKD (chronic kidney disease) stage 2,  Osteopenia, Hyperlipidemia, Neoplasm of uncertain behavior of skin of nose, Spinal stenosis, multilevel, and Bilateral change in hearing.      Assessment: Received Medicare EMMI General Discharge Red Flag Alert follow up referral on 11/11/2019.   Red Flag Alert Trigger, Day #1, patient answered yes to the following question:  Other questions/problems?     [facility] EMMI follow up pending patient contact.     Plan: RNCM will send unsuccessful outreach letter, [facility] pamphlet, handout: Know Before You Go, will call patient for 2nd telephone outreach attempt within 4 business days, [facility] EMMI follow up, and proceed with case closure, within 10 business days if no return call.      Alvilda Mckenna H. Annia Friendly, BSN, [place] Management [facility] Telephonic CM Phone: [phone] Fax: [phone]

## 2019-11-13 ENCOUNTER — Other Ambulatory Visit: Payer: Self-pay | Admitting: *Deleted

## 2019-11-13 DIAGNOSIS — M169 Osteoarthritis of hip, unspecified: Secondary | ICD-10-CM | POA: Diagnosis not present

## 2019-11-13 DIAGNOSIS — Z471 Aftercare following joint replacement surgery: Secondary | ICD-10-CM | POA: Diagnosis not present

## 2019-11-13 DIAGNOSIS — G9520 Unspecified cord compression: Secondary | ICD-10-CM | POA: Diagnosis not present

## 2019-11-13 DIAGNOSIS — M5136 Other intervertebral disc degeneration, lumbar region: Secondary | ICD-10-CM | POA: Diagnosis not present

## 2019-11-13 DIAGNOSIS — G8929 Other chronic pain: Secondary | ICD-10-CM | POA: Diagnosis not present

## 2019-11-13 DIAGNOSIS — M48061 Spinal stenosis, lumbar region without neurogenic claudication: Secondary | ICD-10-CM | POA: Diagnosis not present

## 2019-11-13 DIAGNOSIS — M5412 Radiculopathy, cervical region: Secondary | ICD-10-CM | POA: Diagnosis not present

## 2019-11-13 DIAGNOSIS — M47817 Spondylosis without myelopathy or radiculopathy, lumbosacral region: Secondary | ICD-10-CM | POA: Diagnosis not present

## 2019-11-13 DIAGNOSIS — I129 Hypertensive chronic kidney disease with stage 1 through stage 4 chronic kidney disease, or unspecified chronic kidney disease: Secondary | ICD-10-CM | POA: Diagnosis not present

## 2019-11-13 DIAGNOSIS — N182 Chronic kidney disease, stage 2 (mild): Secondary | ICD-10-CM | POA: Diagnosis not present

## 2019-11-13 NOTE — Patient Outreach (Signed)
[facility]) Care Management  11/13/2019  BREZLYN TROUPE October 07, 1946 [id number]   Subjective: Received voicemail from Bradly Chris, states she is returning call, and requested call back. Telephone call to patient's home number, no answer, left HIPAA compliant voicemail message, and requested call back.    Objective: Per KPN (Knowledge Performance Now, point of care tool) and chart review, patient hospitalized 11/04/2019 - 11/06/2019 for PRIMARY OSTEOARTHRITIS OF RIGHT KNEE, status post Righttotal knee arthroplasty using computer-assisted navigation on 11/04/2019 .  Patient also has a history of hypertension, Cervical radiculitis, Dysfunction of Eustachian tube, left, Chronic jaw pain, Scoliosis (and kyphoscoliosis), idiopathic, Status post total shoulder arthroplasty (08/09/2018), Chronic right shoulder pain, CKD (chronic kidney disease) stage 2,  Osteopenia, Hyperlipidemia, Neoplasm of uncertain behavior of skin of nose, Spinal stenosis, multilevel, and Bilateral change in hearing.      Assessment: Received Medicare EMMI General Discharge Red Flag Alert follow up referral on 11/11/2019.   Red Flag Alert Trigger, Day #1, patient answered yes to the following question:  Other questions/problems?     [facility] EMMI follow up pending patient contact.     Plan: RNCM has  sent unsuccessful outreach letter, [facility] pamphlet, handout: Know Before You Go, will call patient for 3rd telephone outreach attempt within 4 business days, [facility] EMMI follow up, and proceed with case closure, within 10 business days if no return call.     Daeron Carreno H. Annia Friendly, BSN, [place] Management [facility] Telephonic CM Phone: [phone] Fax: [phone]

## 2019-11-15 DIAGNOSIS — M5412 Radiculopathy, cervical region: Secondary | ICD-10-CM | POA: Diagnosis not present

## 2019-11-15 DIAGNOSIS — M169 Osteoarthritis of hip, unspecified: Secondary | ICD-10-CM | POA: Diagnosis not present

## 2019-11-15 DIAGNOSIS — I129 Hypertensive chronic kidney disease with stage 1 through stage 4 chronic kidney disease, or unspecified chronic kidney disease: Secondary | ICD-10-CM | POA: Diagnosis not present

## 2019-11-15 DIAGNOSIS — N182 Chronic kidney disease, stage 2 (mild): Secondary | ICD-10-CM | POA: Diagnosis not present

## 2019-11-15 DIAGNOSIS — G8929 Other chronic pain: Secondary | ICD-10-CM | POA: Diagnosis not present

## 2019-11-15 DIAGNOSIS — M5136 Other intervertebral disc degeneration, lumbar region: Secondary | ICD-10-CM | POA: Diagnosis not present

## 2019-11-15 DIAGNOSIS — Z471 Aftercare following joint replacement surgery: Secondary | ICD-10-CM | POA: Diagnosis not present

## 2019-11-15 DIAGNOSIS — M48061 Spinal stenosis, lumbar region without neurogenic claudication: Secondary | ICD-10-CM | POA: Diagnosis not present

## 2019-11-15 DIAGNOSIS — M47817 Spondylosis without myelopathy or radiculopathy, lumbosacral region: Secondary | ICD-10-CM | POA: Diagnosis not present

## 2019-11-15 DIAGNOSIS — G9520 Unspecified cord compression: Secondary | ICD-10-CM | POA: Diagnosis not present

## 2019-11-18 ENCOUNTER — Other Ambulatory Visit: Payer: Self-pay | Admitting: *Deleted

## 2019-11-18 DIAGNOSIS — G8929 Other chronic pain: Secondary | ICD-10-CM | POA: Diagnosis not present

## 2019-11-18 DIAGNOSIS — I129 Hypertensive chronic kidney disease with stage 1 through stage 4 chronic kidney disease, or unspecified chronic kidney disease: Secondary | ICD-10-CM | POA: Diagnosis not present

## 2019-11-18 DIAGNOSIS — M169 Osteoarthritis of hip, unspecified: Secondary | ICD-10-CM | POA: Diagnosis not present

## 2019-11-18 DIAGNOSIS — M47817 Spondylosis without myelopathy or radiculopathy, lumbosacral region: Secondary | ICD-10-CM | POA: Diagnosis not present

## 2019-11-18 DIAGNOSIS — M5412 Radiculopathy, cervical region: Secondary | ICD-10-CM | POA: Diagnosis not present

## 2019-11-18 DIAGNOSIS — Z471 Aftercare following joint replacement surgery: Secondary | ICD-10-CM | POA: Diagnosis not present

## 2019-11-18 DIAGNOSIS — G9520 Unspecified cord compression: Secondary | ICD-10-CM | POA: Diagnosis not present

## 2019-11-18 DIAGNOSIS — M5136 Other intervertebral disc degeneration, lumbar region: Secondary | ICD-10-CM | POA: Diagnosis not present

## 2019-11-18 DIAGNOSIS — M48061 Spinal stenosis, lumbar region without neurogenic claudication: Secondary | ICD-10-CM | POA: Diagnosis not present

## 2019-11-18 DIAGNOSIS — N182 Chronic kidney disease, stage 2 (mild): Secondary | ICD-10-CM | POA: Diagnosis not present

## 2019-11-18 NOTE — Patient Outreach (Addendum)
[place] [facility]) Care Management  11/18/2019  Rachel James [birth year] [id number]   Subjective:  Received voicemail message from Bradly Chris states she is returning call and requested call back.   Telephone call to patient's home number, spoke with patient, and HIPAA verified.  Discussed [facility] Care Management [place] Medicare EMMI General Discharge Red Flag Alert  follow up, patient voiced understanding, and is in agreement to brief follow up.  Patient states she remembers receiving EMMI automated calls, has no questions or problems at this time. Patient states she is doing okay, in the middle of putting a cake in the oven, has in home therapies, and therapies are going well.   States in home therapies are due to end on 11/29/2019 and she will be transitioning to outpatient therapies, will need transportation assistance, has some friends that can assist periodically, in agreement to urgent referral to [place] Social Worker for transportation assistance to outpatient therapy appointments.   States she will choose outpatient therapy facility location based on the available transportation resources.   Patient states she is able to manage self care and has assistance as needed.  States she has a follow up appointment with surgeons office on 11/19/2019 and 12/17/2019.  Discussed importance of hospital follow up with primary MD, patient voices understanding, and states she will follow up as appropriate.  States she has been with primary providers office since hospital discharge, they are aware of the surgery, and no follow up appointment needed at this time.  Patient voices understanding of medical diagnosis, reason for hospitalization, and treatment plan. States she is accessing her [place] Medicare benefits as needed via member services number on back of card. Discussed Advanced Directives, advised of [place] Management Social Worker  Advanced Directives document completion benefit,  patient voices understanding, declined referral at this time, and will access benefit if needed in the future. Patient states she does not have any education material, EMMI follow up, care coordination, care management, disease monitoring, or pharmacy needs at this time.  States she is very appreciative of the follow up, is in agreement to receive [place] Management services, and EMMI follow up calls as needed.    Objective:Per KPN (Knowledge Performance Now, point of care tool) and chart review,patient hospitalized 11/04/2019 - 11/06/2019 forPRIMARY OSTEOARTHRITIS OF RIGHT KNEE, status postRighttotal knee arthroplasty using computer-assisted navigationon 11/04/2019.Patient also has a history of hypertension,Cervical radiculitis,Dysfunction of Eustachian tube, left,Chronic jaw pain,Scoliosis (and kyphoscoliosis), idiopathic,Status post total shoulder arthroplasty(08/09/2018),Chronic right shoulder pain,CKD (chronic kidney disease) stage 2, Osteopenia,Hyperlipidemia,Neoplasm of uncertain behavior of skin of nose,Spinal stenosis, multilevel, andBilateral change in hearing.     Assessment: Received Medicare EMMI General Discharge Red Flag Alert follow up referral on 11/11/2019. Red Flag Alert Trigger, Day #1, patient answered yes to the following question: Other questions/problems?EMMI follow up completed and will refer patient to [place] Management Social Worker for transportation assistance to outpatient therapy appointments.       Plan:RNCM  will refer patient to [place] Management Social Worker for transportation assistance to outpatient therapy appointments.       Cambre Matson H. Annia Friendly, BSN, [place] Management [facility] Telephonic CM Phone: [phone] Fax: [phone]

## 2019-11-19 ENCOUNTER — Other Ambulatory Visit: Payer: Self-pay

## 2019-11-19 DIAGNOSIS — Z471 Aftercare following joint replacement surgery: Secondary | ICD-10-CM | POA: Diagnosis not present

## 2019-11-19 NOTE — Patient Outreach (Signed)
[place] [facility]) Care Management  11/19/2019  Rachel James Jun 14, 1946 [id number]    Social Work referral received from [facility], Sonda Rumble to outreach patient regarding transportation assistance to outpatient therapy appointments. Successful outreach to patient today.  Patient currently receiving in home therapy which is due to end on 11/29/2019.  She will transition to outpatient therapy at that time and will need transportation assistance. Informed patient about [employer] services.  Provided her with contact information as she will need to speak to someone to get added into their system.  Informed patient that cost of transportation is $5 per ride, however, they will often work with patient's if unable to afford this cost. Closing social work case but did encourage her to call if additional needs arise.   Ronn Melena, BSW Social Worker [phone]

## 2019-11-20 DIAGNOSIS — N182 Chronic kidney disease, stage 2 (mild): Secondary | ICD-10-CM | POA: Diagnosis not present

## 2019-11-20 DIAGNOSIS — I129 Hypertensive chronic kidney disease with stage 1 through stage 4 chronic kidney disease, or unspecified chronic kidney disease: Secondary | ICD-10-CM | POA: Diagnosis not present

## 2019-11-20 DIAGNOSIS — M5412 Radiculopathy, cervical region: Secondary | ICD-10-CM | POA: Diagnosis not present

## 2019-11-20 DIAGNOSIS — G8929 Other chronic pain: Secondary | ICD-10-CM | POA: Diagnosis not present

## 2019-11-20 DIAGNOSIS — M47817 Spondylosis without myelopathy or radiculopathy, lumbosacral region: Secondary | ICD-10-CM | POA: Diagnosis not present

## 2019-11-20 DIAGNOSIS — Z471 Aftercare following joint replacement surgery: Secondary | ICD-10-CM | POA: Diagnosis not present

## 2019-11-20 DIAGNOSIS — M169 Osteoarthritis of hip, unspecified: Secondary | ICD-10-CM | POA: Diagnosis not present

## 2019-11-20 DIAGNOSIS — M48061 Spinal stenosis, lumbar region without neurogenic claudication: Secondary | ICD-10-CM | POA: Diagnosis not present

## 2019-11-20 DIAGNOSIS — M5136 Other intervertebral disc degeneration, lumbar region: Secondary | ICD-10-CM | POA: Diagnosis not present

## 2019-11-20 DIAGNOSIS — G9520 Unspecified cord compression: Secondary | ICD-10-CM | POA: Diagnosis not present

## 2019-11-21 DIAGNOSIS — D2272 Melanocytic nevi of left lower limb, including hip: Secondary | ICD-10-CM | POA: Diagnosis not present

## 2019-11-21 DIAGNOSIS — D225 Melanocytic nevi of trunk: Secondary | ICD-10-CM | POA: Diagnosis not present

## 2019-11-21 DIAGNOSIS — L720 Epidermal cyst: Secondary | ICD-10-CM | POA: Diagnosis not present

## 2019-11-21 DIAGNOSIS — D2271 Melanocytic nevi of right lower limb, including hip: Secondary | ICD-10-CM | POA: Diagnosis not present

## 2019-11-21 DIAGNOSIS — L249 Irritant contact dermatitis, unspecified cause: Secondary | ICD-10-CM | POA: Diagnosis not present

## 2019-11-21 DIAGNOSIS — D2262 Melanocytic nevi of left upper limb, including shoulder: Secondary | ICD-10-CM | POA: Diagnosis not present

## 2019-11-21 DIAGNOSIS — D2261 Melanocytic nevi of right upper limb, including shoulder: Secondary | ICD-10-CM | POA: Diagnosis not present

## 2019-11-21 DIAGNOSIS — L821 Other seborrheic keratosis: Secondary | ICD-10-CM | POA: Diagnosis not present

## 2019-11-22 DIAGNOSIS — M5412 Radiculopathy, cervical region: Secondary | ICD-10-CM | POA: Diagnosis not present

## 2019-11-22 DIAGNOSIS — N182 Chronic kidney disease, stage 2 (mild): Secondary | ICD-10-CM | POA: Diagnosis not present

## 2019-11-22 DIAGNOSIS — M169 Osteoarthritis of hip, unspecified: Secondary | ICD-10-CM | POA: Diagnosis not present

## 2019-11-22 DIAGNOSIS — M47817 Spondylosis without myelopathy or radiculopathy, lumbosacral region: Secondary | ICD-10-CM | POA: Diagnosis not present

## 2019-11-22 DIAGNOSIS — I129 Hypertensive chronic kidney disease with stage 1 through stage 4 chronic kidney disease, or unspecified chronic kidney disease: Secondary | ICD-10-CM | POA: Diagnosis not present

## 2019-11-22 DIAGNOSIS — G9520 Unspecified cord compression: Secondary | ICD-10-CM | POA: Diagnosis not present

## 2019-11-22 DIAGNOSIS — M5136 Other intervertebral disc degeneration, lumbar region: Secondary | ICD-10-CM | POA: Diagnosis not present

## 2019-11-22 DIAGNOSIS — Z471 Aftercare following joint replacement surgery: Secondary | ICD-10-CM | POA: Diagnosis not present

## 2019-11-22 DIAGNOSIS — M48061 Spinal stenosis, lumbar region without neurogenic claudication: Secondary | ICD-10-CM | POA: Diagnosis not present

## 2019-11-22 DIAGNOSIS — G8929 Other chronic pain: Secondary | ICD-10-CM | POA: Diagnosis not present

## 2019-11-25 DIAGNOSIS — G8929 Other chronic pain: Secondary | ICD-10-CM | POA: Diagnosis not present

## 2019-11-25 DIAGNOSIS — M169 Osteoarthritis of hip, unspecified: Secondary | ICD-10-CM | POA: Diagnosis not present

## 2019-11-25 DIAGNOSIS — N182 Chronic kidney disease, stage 2 (mild): Secondary | ICD-10-CM | POA: Diagnosis not present

## 2019-11-25 DIAGNOSIS — I129 Hypertensive chronic kidney disease with stage 1 through stage 4 chronic kidney disease, or unspecified chronic kidney disease: Secondary | ICD-10-CM | POA: Diagnosis not present

## 2019-11-25 DIAGNOSIS — M5136 Other intervertebral disc degeneration, lumbar region: Secondary | ICD-10-CM | POA: Diagnosis not present

## 2019-11-25 DIAGNOSIS — M48061 Spinal stenosis, lumbar region without neurogenic claudication: Secondary | ICD-10-CM | POA: Diagnosis not present

## 2019-11-25 DIAGNOSIS — G9520 Unspecified cord compression: Secondary | ICD-10-CM | POA: Diagnosis not present

## 2019-11-25 DIAGNOSIS — Z471 Aftercare following joint replacement surgery: Secondary | ICD-10-CM | POA: Diagnosis not present

## 2019-11-25 DIAGNOSIS — M5412 Radiculopathy, cervical region: Secondary | ICD-10-CM | POA: Diagnosis not present

## 2019-11-25 DIAGNOSIS — M47817 Spondylosis without myelopathy or radiculopathy, lumbosacral region: Secondary | ICD-10-CM | POA: Diagnosis not present

## 2019-11-28 DIAGNOSIS — M25661 Stiffness of right knee, not elsewhere classified: Secondary | ICD-10-CM | POA: Diagnosis not present

## 2019-11-28 DIAGNOSIS — M6281 Muscle weakness (generalized): Secondary | ICD-10-CM | POA: Diagnosis not present

## 2019-11-28 DIAGNOSIS — M25561 Pain in right knee: Secondary | ICD-10-CM | POA: Diagnosis not present

## 2019-11-28 DIAGNOSIS — Z96651 Presence of right artificial knee joint: Secondary | ICD-10-CM | POA: Diagnosis not present

## 2019-12-02 ENCOUNTER — Ambulatory Visit: Payer: Medicare HMO | Admitting: Urology

## 2019-12-02 DIAGNOSIS — M25561 Pain in right knee: Secondary | ICD-10-CM | POA: Diagnosis not present

## 2019-12-02 DIAGNOSIS — M25661 Stiffness of right knee, not elsewhere classified: Secondary | ICD-10-CM | POA: Diagnosis not present

## 2019-12-02 DIAGNOSIS — M6281 Muscle weakness (generalized): Secondary | ICD-10-CM | POA: Diagnosis not present

## 2019-12-02 DIAGNOSIS — Z96651 Presence of right artificial knee joint: Secondary | ICD-10-CM | POA: Diagnosis not present

## 2019-12-04 DIAGNOSIS — M25561 Pain in right knee: Secondary | ICD-10-CM | POA: Diagnosis not present

## 2019-12-04 DIAGNOSIS — M25661 Stiffness of right knee, not elsewhere classified: Secondary | ICD-10-CM | POA: Diagnosis not present

## 2019-12-04 DIAGNOSIS — M6281 Muscle weakness (generalized): Secondary | ICD-10-CM | POA: Diagnosis not present

## 2019-12-04 DIAGNOSIS — Z96651 Presence of right artificial knee joint: Secondary | ICD-10-CM | POA: Diagnosis not present

## 2019-12-05 DIAGNOSIS — R32 Unspecified urinary incontinence: Secondary | ICD-10-CM | POA: Diagnosis not present

## 2019-12-05 DIAGNOSIS — Z791 Long term (current) use of non-steroidal anti-inflammatories (NSAID): Secondary | ICD-10-CM | POA: Diagnosis not present

## 2019-12-05 DIAGNOSIS — G4700 Insomnia, unspecified: Secondary | ICD-10-CM | POA: Diagnosis not present

## 2019-12-05 DIAGNOSIS — J309 Allergic rhinitis, unspecified: Secondary | ICD-10-CM | POA: Diagnosis not present

## 2019-12-05 DIAGNOSIS — M1990 Unspecified osteoarthritis, unspecified site: Secondary | ICD-10-CM | POA: Diagnosis not present

## 2019-12-05 DIAGNOSIS — Z79899 Other long term (current) drug therapy: Secondary | ICD-10-CM | POA: Diagnosis not present

## 2019-12-05 DIAGNOSIS — G8929 Other chronic pain: Secondary | ICD-10-CM | POA: Diagnosis not present

## 2019-12-05 DIAGNOSIS — I10 Essential (primary) hypertension: Secondary | ICD-10-CM | POA: Diagnosis not present

## 2019-12-05 DIAGNOSIS — Z7982 Long term (current) use of aspirin: Secondary | ICD-10-CM | POA: Diagnosis not present

## 2019-12-05 DIAGNOSIS — E785 Hyperlipidemia, unspecified: Secondary | ICD-10-CM | POA: Diagnosis not present

## 2019-12-06 DIAGNOSIS — M6281 Muscle weakness (generalized): Secondary | ICD-10-CM | POA: Diagnosis not present

## 2019-12-06 DIAGNOSIS — M25561 Pain in right knee: Secondary | ICD-10-CM | POA: Diagnosis not present

## 2019-12-06 DIAGNOSIS — M25661 Stiffness of right knee, not elsewhere classified: Secondary | ICD-10-CM | POA: Diagnosis not present

## 2019-12-06 DIAGNOSIS — Z96651 Presence of right artificial knee joint: Secondary | ICD-10-CM | POA: Diagnosis not present

## 2019-12-10 DIAGNOSIS — M6281 Muscle weakness (generalized): Secondary | ICD-10-CM | POA: Diagnosis not present

## 2019-12-10 DIAGNOSIS — M25661 Stiffness of right knee, not elsewhere classified: Secondary | ICD-10-CM | POA: Diagnosis not present

## 2019-12-10 DIAGNOSIS — Z96651 Presence of right artificial knee joint: Secondary | ICD-10-CM | POA: Diagnosis not present

## 2019-12-10 DIAGNOSIS — M25561 Pain in right knee: Secondary | ICD-10-CM | POA: Diagnosis not present

## 2019-12-16 DIAGNOSIS — M6281 Muscle weakness (generalized): Secondary | ICD-10-CM | POA: Diagnosis not present

## 2019-12-16 DIAGNOSIS — Z96651 Presence of right artificial knee joint: Secondary | ICD-10-CM | POA: Diagnosis not present

## 2019-12-16 DIAGNOSIS — M25561 Pain in right knee: Secondary | ICD-10-CM | POA: Diagnosis not present

## 2019-12-16 DIAGNOSIS — M25661 Stiffness of right knee, not elsewhere classified: Secondary | ICD-10-CM | POA: Diagnosis not present

## 2019-12-17 DIAGNOSIS — M1711 Unilateral primary osteoarthritis, right knee: Secondary | ICD-10-CM | POA: Diagnosis not present

## 2019-12-18 DIAGNOSIS — Z96651 Presence of right artificial knee joint: Secondary | ICD-10-CM | POA: Diagnosis not present

## 2019-12-18 DIAGNOSIS — M25561 Pain in right knee: Secondary | ICD-10-CM | POA: Diagnosis not present

## 2019-12-18 DIAGNOSIS — M6281 Muscle weakness (generalized): Secondary | ICD-10-CM | POA: Diagnosis not present

## 2019-12-18 DIAGNOSIS — M25661 Stiffness of right knee, not elsewhere classified: Secondary | ICD-10-CM | POA: Diagnosis not present

## 2019-12-23 ENCOUNTER — Encounter: Payer: Self-pay | Admitting: Family Medicine

## 2019-12-23 DIAGNOSIS — M4716 Other spondylosis with myelopathy, lumbar region: Secondary | ICD-10-CM

## 2019-12-23 DIAGNOSIS — M159 Polyosteoarthritis, unspecified: Secondary | ICD-10-CM

## 2019-12-24 ENCOUNTER — Encounter: Payer: Self-pay | Admitting: Family Medicine

## 2019-12-26 NOTE — Telephone Encounter (Signed)
Pt called asking if Rachel James could send in a rx for patients shoulder pain.  She was taking it for her knee from Dr. Marry Guan but would like a rx for her left shoulder.  [facility]  CB#  [phone]

## 2019-12-27 MED ORDER — NAPROXEN SODIUM 220 MG PO CAPS: 220.0000 mg | ORAL_CAPSULE | Freq: Two times a day (BID) | ORAL | 1 refills | Status: DC | PRN

## 2020-01-15 ENCOUNTER — Other Ambulatory Visit: Payer: Self-pay

## 2020-01-15 ENCOUNTER — Ambulatory Visit (INDEPENDENT_AMBULATORY_CARE_PROVIDER_SITE_OTHER): Payer: Medicare HMO | Admitting: Internal Medicine

## 2020-01-15 ENCOUNTER — Encounter: Payer: Self-pay | Admitting: Family Medicine

## 2020-01-15 ENCOUNTER — Encounter: Payer: Self-pay | Admitting: Internal Medicine

## 2020-01-15 VITALS — Ht 63.0 in | Wt 138.0 lb

## 2020-01-15 DIAGNOSIS — G8929 Other chronic pain: Secondary | ICD-10-CM

## 2020-01-15 DIAGNOSIS — M5412 Radiculopathy, cervical region: Secondary | ICD-10-CM | POA: Diagnosis not present

## 2020-01-15 DIAGNOSIS — M542 Cervicalgia: Secondary | ICD-10-CM | POA: Diagnosis not present

## 2020-01-15 DIAGNOSIS — N182 Chronic kidney disease, stage 2 (mild): Secondary | ICD-10-CM | POA: Diagnosis not present

## 2020-01-15 MED ORDER — PREDNISONE 10 MG PO TABS: ORAL_TABLET | ORAL | 0 refills | Status: DC

## 2020-01-15 NOTE — Progress Notes (Signed)
Name: Rachel James   MRN: [id number]    DOB: 18-Oct-1946   Date:01/15/2020       Progress Note  Subjective  Chief Complaint  Chief Complaint  Patient presents with  . Neck Pain    onset about a week ago    I connected with  [name] on 01/15/20 at  1:20 PM EST by telephone and verified that I am speaking with the correct person using two identifiers.  I discussed the limitations, risks, security and privacy concerns of performing an evaluation and management service by telephone and the availability of in person appointments. Staff also discussed with the patient that there may be a patient responsible charge related to this service. Patient Location: [place] residence with her sister Provider Location: [facility] Additional Individuals present: none  HPI -  Last office visit was with Raelyn Ensign 08/12/2019, note reviewed.  Patient is a 74 year old female who is currently in [place], who messaged Raquel Sarna this morning noting concerns with recurring neck pain.  She has a known history of hypertension, hyperlipidemia, GERD, CKD with her last kidney function normal, osteoarthritis with a total knee replacement in November 2020 and rehabbing after currently, and cervical radiculitis with a history of chronic intermittent neck pains in her past.  The neck pains have been present for many years, and she noted they seem to worsen when she is stressed, although not any recent increase stress she could identify. She brought her own pillow to [facility] with her as well. Staying at her sisters.  She has a history of a cervical spine fusion many years ago.  She occasionally gets some tingling in her hands bilaterally. In the past she has used NSAIDs, a muscle relaxer, local measures including some heat and stretching, and exercises to help.  Physical therapy was entertained at her last visit although cost was noted as a concern at that time and that was not pursued.  She noted a brief steroid course that was  given previously when had a similar flare was very helpful. She noted the neck pain has increased again this past week. No prior one time trauma noted. Was rated a 4/10 on the pain scale.  The pain is felt more left-sided, and occasionally she feels more of a tightness into her left scapular region.  No pain radiates down the arms, she has chronic tingling of her fingers, has had since her fusion in 2012, and that has not increased.  She denies any weakness or dropping things.  Has tried a cream and stretching without much help.  She takes 4 Tylenol a day presently for arthritic issues, and also takes generic naproxen twice a day presently, 220 mg.  It was recommended previously not to take higher doses of the NSAIDs with her chronic kidney disease history.  She also has tried to wean herself from the tizanidine product, as she was having more vivid dreams at night, and thought that may be contributing.  That has improved by lessening use of the tizanidine.  She has had no stomach concerns taking the medicines, and does have a history of reflux, although that has been controlled recently.  No h/o DM Never smoker  Patient Active Problem List   Diagnosis Date Noted  . Total knee replacement status 11/04/2019  . Cervical radiculitis 08/12/2019  . Dysfunction of Eustachian tube, left 08/12/2019  . Chronic jaw pain 08/12/2019  . Primary osteoarthritis of right knee 03/17/2019  . Scoliosis (and kyphoscoliosis), idiopathic 03/17/2019  .  Status post total shoulder arthroplasty 08/09/2018  . Hyponatremia 03/31/2018  . Chronic right shoulder pain 09/26/2017  . CKD (chronic kidney disease) stage 2, GFR 60-89 ml/min 07/11/2017  . Osteopenia 06/22/2017  . 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 Shoulder pain, bilateral 09/08/2016  . Need for prophylactic vaccination with Streptococcus pneumoniae (Pneumococcus) and Influenza vaccines 08/16/2016  . Urinary frequency 08/16/2016  .  Cutaneous skin tags 04/18/2016  . Accidental fall 12/09/2015  . Hyperlipidemia LDL goal <100 09/17/2015  . Degenerative arthritis of lumbar spine with cord compression 07/16/2015  . Lumbar and sacral osteoarthritis 07/16/2015  . Bilateral change in hearing 05/27/2015  . Spinal stenosis, multilevel 05/27/2015  . Chronic radicular low back pain 05/27/2015  . Allergic rhinitis 01/06/2014  . Osteoarthrosis involving multiple sites 01/06/2014  . Arthropathia 01/06/2014  . Hypertension goal BP (blood pressure) < 140/90 01/06/2014  . Arthritis of right knee 01/06/2014    Past Surgical History:  Procedure Laterality Date  .  THUMB SURGERY Left   . APPENDECTOMY    . BREAST BIOPSY Right 1992   neg  . BREAST LUMPECTOMY Right   . BUNIONECTOMY Bilateral   . CERVICAL DISCECTOMY  09/18/2012   C4-C5 AND C6-C7 ACDF   . JOINT REPLACEMENT  2008, 2019   thumb, shoulder  . KNEE ARTHROPLASTY Right 11/04/2019   Procedure: COMPUTER ASSISTED TOTAL KNEE ARTHROPLASTY;  Surgeon: Dereck Leep, MD;  Location: [facility];  Service: Orthopedics;  Laterality: Right;  . SHOULDER ARTHROSCOPY WITH ROTATOR CUFF REPAIR AND OPEN BICEPS TENODESIS Right 12/20/2017   Procedure: SHOULDER ARTHROSCOPY WITH DEBRIDEMENT, DECOMPRESSION AND  BICEPS TENODESIS;  Surgeon: Corky Mull, MD;  Location: [place];  Service: Orthopedics;  Laterality: Right;  . SPINE SURGERY  2013   cervical  . TOTAL SHOULDER ARTHROPLASTY Right 08/09/2018   Procedure: RIGHT TOTAL SHOULDER ARTHROPLASTY;  Surgeon: Justice Britain, MD;  Location: [place];  Service: Orthopedics;  Laterality: Right;  . TUBAL LIGATION Bilateral     Family History  Problem Relation Age of Onset  . Heart disease Mother   . Heart attack Mother   . Arthritis Mother   . Heart disease Father   . Heart disease Sister   . Arthritis Sister   . Heart disease Brother   . Heart failure Brother   . Memory loss Brother   . Arthritis Maternal Grandmother   . Diabetes  Paternal Grandmother   . Pulmonary embolism Sister   . Breast cancer Neg Hx     Social History   Socioeconomic History  . Marital status: Divorced    Spouse name: Not on file  . Number of children: 3  . Years of education: Not on file  . Highest education level: Associate degree: occupational, Hotel manager, or vocational program  Occupational History  . Occupation: retired  Tobacco Use  . Smoking status: Never Smoker  . Smokeless tobacco: Never Used  . Tobacco comment: smoking cessation materials not required  Substance and Sexual Activity  . Alcohol use: Not Currently    Comment: alcohol raises BP  . Drug use: No  . Sexual activity: Not Currently  Other Topics Concern  . Not on file  Social History Narrative  . Not on file   Social Determinants of Health   Financial Resource Strain:   . Difficulty of Paying Living Expenses: Not on file  Food Insecurity:   . Worried About Charity fundraiser in the Last Year: Not on file  .  Ran Out of Food in the Last Year: Not on file  Transportation Needs:   . Lack of Transportation (Medical): Not on file  . Lack of Transportation (Non-Medical): Not on file  Physical Activity:   . Days of Exercise per Week: Not on file  . Minutes of Exercise per Session: Not on file  Stress:   . Feeling of Stress : Not on file  Social Connections:   . Frequency of Communication with Friends and Family: Not on file  . Frequency of Social Gatherings with Friends and Family: Not on file  . Attends Religious Services: Not on file  . Active Member of Clubs or Organizations: Not on file  . Attends Archivist Meetings: Not on file  . Marital Status: Not on file  Intimate Partner Violence:   . Fear of Current or Ex-Partner: Not on file  . Emotionally Abused: Not on file  . Physically Abused: Not on file  . Sexually Abused: Not on file     Current Outpatient Medications:  .  acetaminophen (TYLENOL) 500 MG tablet, Take 1,000 mg by mouth 2  (two) times daily., Disp: , Rfl:  .  carbamide peroxide (DEBROX) 6.5 % OTIC solution, 5 drops 2 (two) times daily as needed., Disp: , Rfl:  .  cholecalciferol (VITAMIN D) 1000 units tablet, Take 1 tablet (1,000 Units total) by mouth daily., Disp: , Rfl:  .  docusate sodium (COLACE) 100 MG capsule, Take 100 mg by mouth daily., Disp: , Rfl:  .  fluticasone (FLONASE) 50 MCG/ACT nasal spray, Place 1 spray into both nostrils daily., Disp: , Rfl:  .  hydrochlorothiazide (MICROZIDE) 12.5 MG capsule, Take 1 capsule (12.5 mg total) by mouth daily., Disp: 90 capsule, Rfl: 1 .  losartan (COZAAR) 100 MG tablet, Take 1 tablet (100 mg total) by mouth daily. (Patient taking differently: Take 100 mg by mouth at bedtime. ), Disp: 90 tablet, Rfl: 1 .  Lysine 500 MG TABS, Take by mouth 2 (two) times daily as needed., Disp: , Rfl:  .  Magnesium Oxide 500 MG TABS, Take 1 tablet by mouth daily., Disp: , Rfl:  .  metoprolol succinate (TOPROL-XL) 100 MG 24 hr tablet, Take 1 tablet (100 mg total) by mouth daily. Take with or immediately following a meal., Disp: 90 tablet, Rfl: 1 .  Multiple Vitamins-Minerals (MULTIVITAMIN PO), Take 1 tablet by mouth daily., Disp: , Rfl:  .  Naproxen Sodium 220 MG CAPS, Take 1 capsule (220 mg total) by mouth 2 (two) times daily as needed (Pain)., Disp: 180 capsule, Rfl: 1 .  oxymetazoline (AFRIN) 0.05 % nasal spray, Place 1 spray into both nostrils 2 (two) times daily as needed for congestion., Disp: , Rfl:  .  ranitidine (ZANTAC) 150 MG capsule, Take 150 mg by mouth 2 (two) times daily as needed for heartburn., Disp: , Rfl:  .  simvastatin (ZOCOR) 10 MG tablet, Take 1 tablet (10 mg total) by mouth daily. (Patient taking differently: Take 10 mg by mouth daily at 6 PM. ), Disp: 90 tablet, Rfl: 1 .  tiZANidine (ZANAFLEX) 4 MG tablet, Take 1 tablet (4 mg total) by mouth every 8 (eight) hours as needed for muscle spasms., Disp: 90 tablet, Rfl: 0 .  amoxicillin (AMOXIL) 500 MG capsule, Take 500 mg  by mouth once. Prior to dental appt, Disp: , Rfl:  .  predniSONE (DELTASONE) 10 MG tablet, Take 4 tablets daily x two days, then 2 tablets daily x two days, then 1 tablet  daily x two days, Disp: 14 tablet, Rfl: 0  Allergies  Allergen Reactions  . Codeine Itching and Other (See Comments)    nightmare  . Oxycodone Itching    With staff assistance, above reviewed with the patient today.  ROS: As per HPI, otherwise no specific complaints on a limited and focused system review   Objective  Virtual encounter, vitals not obtained.  Body mass index is 24.45 kg/m.  Physical Exam   Appears in NAD via conversation, very pleasant Neuro/psych: Speaking in complete sentences. Speech is normal, Patient has a normal mood and affect, very approp with conversation, judgment and thought content normal.    PHQ2/9: Depression screen [facility] 2/9 01/15/2020 10/01/2019 08/12/2019 06/04/2019 10/26/2018  Decreased Interest 0 0 0 0 0  Down, Depressed, Hopeless 0 0 0 0 0  PHQ - 2 Score 0 0 0 0 0  Altered sleeping 1 - 0 0 -  Tired, decreased energy 0 - 0 0 -  Change in appetite 0 - 0 0 -  Feeling bad or failure about yourself  0 - 0 0 -  Trouble concentrating 0 - 0 0 -  Moving slowly or fidgety/restless 0 - 0 0 -  Suicidal thoughts 0 - 0 0 -  PHQ-9 Score 1 - 0 0 -  Difficult doing work/chores Not difficult at all - Not difficult at all Not difficult at all -   PHQ-2/9 Result reviewed - negative  Fall Risk: Fall Risk  01/15/2020 10/01/2019 08/12/2019 06/04/2019 04/10/2019  Falls in the past year? 0 0 0 0 0  Number falls in past yr: 0 0 0 0 0  Injury with Fall? 0 0 0 0 0  Risk for fall due to : - - - - -  Risk for fall due to: Comment - - - - -  Follow up - Falls prevention discussed Falls evaluation completed Falls evaluation completed -     Assessment & Plan  1. Neck pain, acute on chronic  She noted a steroid burst was quite helpful for her previously, and do feel it is reasonable.  Especially  noting her chronic kidney disease history and trying to avoid higher doses of NSAIDs. Prednisone-40 mg x  2 days, then 20 mg x 2 days, then 10 mg x 2 days, then stop.  This was sent to her pharmacy in [place] to fill. Can continue her Tylenol products, and the low-dose of generic naproxen, emphasize taking with food as she is doing. Also noted she can resume the tizanidine product if more muscle spasm type concerns arise, but would not do routinely at this time with her history. Also emphasized local measures with contrast therapy approach, with warm compresses, range of motion exercises as she is doing, and if is more painful, can always and with some cold to help with inflammation.    2. Cervical radiculitis, s/p cervical fusion   No seemingly worsening radicular signs with this recent flare  3. H/o CKD - last labs ok  Lab Results  Component Value Date   CREATININE 0.83 10/23/2019   BUN 21 10/23/2019   NA 140 10/23/2019   K 4.3 10/23/2019   CL 103 10/23/2019   CO2 29 10/23/2019    4. H/o GERD  Controlled in recent past.   She also noted she cannot get the Covid vaccine while in [place] as she has to show proof of residence, but does plan to get the vaccine when she returns to this area and March.  Did encourage her to get the vaccination as she plans.  I discussed the assessment and treatment plan with the patient. The patient was provided an opportunity to ask questions and all were answered. The patient agreed with the plan and demonstrated an understanding of the instructions.  The patient was advised to call back or seek an in-person evaluation if the symptoms worsen or if the condition fails to improve as anticipated.  I provided 23 minutes of non-face-to-face time during this encounter that included discussing at length patient's sx/history, pertinent pmhx, medications, treatment and follow up plan. This time also included the necessary documentation, orders, and chart  review.  Towanda Malkin, MD

## 2020-01-20 DIAGNOSIS — R69 Illness, unspecified: Secondary | ICD-10-CM | POA: Diagnosis not present

## 2020-01-22 ENCOUNTER — Encounter: Payer: Self-pay | Admitting: Internal Medicine

## 2020-01-23 MED ORDER — PREDNISONE 10 MG PO TABS: ORAL_TABLET | ORAL | 0 refills | Status: DC

## 2020-01-23 NOTE — Telephone Encounter (Signed)
Patient contacted office and noted neck pain was better with the short course of prednisone, but concerned coming back again as weaned to off. Was given a 6 day course with rapid wean. She requested a second round of prednisone to help and felt was reasonable.  Prescribed another course with 30mg  X 3days, then 20mg  X 3 days, then 10 mg X 3 days for a total of 9 days to hopefully help break the cycle and have further improvement.  Asked Tiffany to communicate this to her and prescription sent to her pharmacy in [place] as did with the last prescription.

## 2020-01-26 ENCOUNTER — Encounter: Payer: Self-pay | Admitting: Family Medicine

## 2020-01-26 DIAGNOSIS — M5412 Radiculopathy, cervical region: Secondary | ICD-10-CM

## 2020-01-26 DIAGNOSIS — I10 Essential (primary) hypertension: Secondary | ICD-10-CM

## 2020-01-27 MED ORDER — METOPROLOL SUCCINATE ER 100 MG PO TB24: 100.0000 mg | ORAL_TABLET | Freq: Every day | ORAL | 0 refills | Status: DC

## 2020-01-27 MED ORDER — TIZANIDINE HCL 4 MG PO TABS: 4.0000 mg | ORAL_TABLET | Freq: Three times a day (TID) | ORAL | 0 refills | Status: DC | PRN

## 2020-01-27 MED ORDER — HYDROCHLOROTHIAZIDE 12.5 MG PO CAPS: 12.5000 mg | ORAL_CAPSULE | Freq: Every day | ORAL | 0 refills | Status: DC

## 2020-02-19 ENCOUNTER — Other Ambulatory Visit: Payer: Self-pay | Admitting: Family Medicine

## 2020-02-19 DIAGNOSIS — M5412 Radiculopathy, cervical region: Secondary | ICD-10-CM

## 2020-02-19 NOTE — Telephone Encounter (Signed)
Requested medication (s) are due for refill today -yes  Requested medication (s) are on the active medication list -yes  Future visit scheduled -yes  Last refill: 01/27/20  Notes to clinic: Patient is requesting non delegated Rx refill.  Requested Prescriptions  Pending Prescriptions Disp Refills   tiZANidine (ZANAFLEX) 4 MG tablet [Pharmacy Med Name: TIZANIDINE HCL 4 MG TABLET] 90 tablet 0    Sig: Take 1 tablet (4 mg total) by mouth every 8 (eight) hours as needed for muscle spasms.      Not Delegated - Cardiovascular:  Alpha-2 Agonists - tizanidine Failed - 02/19/2020  2:30 PM      Failed - This refill cannot be delegated      Passed - Valid encounter within last 6 months    Recent Outpatient Visits           1 month ago Neck pain, acute   [facility] [place] Con[place] D, MD   6 months ago Chronic jaw pain   [place], FNP   8 months ago Chronic jaw pain   [facility] [place], Raquel Sarna E, [place]   10 months ago CKD (chronic kidney disease) stage 2, GFR 60-89 ml/min   [facility] [place], Satira Anis, MD   1 year ago Hypertension goal BP (blood pressure) < 140/90   [place], Satira Anis, MD       Future Appointments             In 7 months [facility], [facility]                 Requested Prescriptions  Pending Prescriptions Disp Refills   tiZANidine (ZANAFLEX) 4 MG tablet [Pharmacy Med Name: TIZANIDINE HCL 4 MG TABLET] 90 tablet 0    Sig: Take 1 tablet (4 mg total) by mouth every 8 (eight) hours as needed for muscle spasms.      Not Delegated - Cardiovascular:  Alpha-2 Agonists - tizanidine Failed - 02/19/2020  2:30 PM      Failed - This refill cannot be delegated      Passed - Valid encounter within last 6 months    Recent Outpatient Visits           1 month ago Neck pain, acute   [facility] [place] Malkin, MD   6 months ago Chronic jaw pain   [place], FNP   8 months ago Chronic jaw pain   [place], [name]   10 months ago CKD (chronic kidney disease) stage 2, GFR 60-89 ml/min   [place], Satira Anis, MD   1 year ago Hypertension goal BP (blood pressure) < 140/90   [place], Satira Anis, MD       Future Appointments             In 7 months [facility], [facility]

## 2020-02-21 ENCOUNTER — Encounter: Payer: Self-pay | Admitting: Internal Medicine

## 2020-02-26 ENCOUNTER — Other Ambulatory Visit: Payer: Self-pay

## 2020-02-26 ENCOUNTER — Encounter: Payer: Self-pay | Admitting: Internal Medicine

## 2020-02-26 DIAGNOSIS — I10 Essential (primary) hypertension: Secondary | ICD-10-CM

## 2020-02-26 MED ORDER — LOSARTAN POTASSIUM 100 MG PO TABS: 100.0000 mg | ORAL_TABLET | Freq: Every day | ORAL | 1 refills | Status: DC

## 2020-02-27 DIAGNOSIS — M25552 Pain in left hip: Secondary | ICD-10-CM | POA: Diagnosis not present

## 2020-02-27 DIAGNOSIS — M1612 Unilateral primary osteoarthritis, left hip: Secondary | ICD-10-CM | POA: Diagnosis not present

## 2020-02-27 NOTE — Progress Notes (Signed)
Name: Rachel James   MRN: [id number]    DOB: 1946/11/17   Date:02/28/2020       Progress Note  Subjective  Chief Complaint  No chief complaint on file.   I connected with  [name]  on 02/28/20 at 11:00 AM EST by a video enabled telemedicine application and verified that I am speaking with the correct person using two identifiers.  I discussed the limitations of evaluation and management by telemedicine and the availability of in person appointments. The patient expressed understanding and agreed to proceed. Staff also discussed with the patient that there may be a patient responsible charge related to this service. Patient Location: Home in [place] Provider Location: [facility] Additional Individuals present: none Briefly saw patient, unable to have audio connection, with the connection unstable, and converted to a phone connection HPI  Patient is a 74 year old female who I talked to by phone late January, 2021 while she was in [place], and she had Merrilee Jansky that morning noting concerns with recurring neck pain.  She had a history of cervical radiculitis, status post cervical fusion with those symptoms not seemingly worsened with that flare.  She was prescribed a weaning dose of prednisone, and did improve, although symptoms started to recur after weaning, and a second course of a weaning dose of steroid was prescribed.  She noted she was significantly improved after that second course when she contacted [facility] about prescription refills.  She contacted the office again with more prescription refill request, and a follow-up phone visit was arranged with me, as her PCP Raquel Sarna is out of the office.  She was going to run out of the losartan before this visit, and that was refilled so she would not miss doses and she did pick that up..  She states she needs her statin refilled presently. She is currently still in [place], and plans to be back in April and will schedule a follow-up visit when  back.  She has a known history of hypertension, hyperlipidemia, GERD, CKD with her last kidney function normal, osteoarthritis with a total knee replacement in November 2020 and rehabbing after currently, and cervical radiculitis with a history of chronic intermittent neck pains in her past.  Neck has remained better, gets stiff at night.  Apply some heat to help. Not as painful like prior. Stopped the tizandine (having dreams of concern).   Walking at park daily, about 8000 steps a day, now hip acting up and getting a cortisone injection with provider there after x-ray yesterday. Made appt with Dr. Marry Guan in April when gets back to area to discuss possible hip replacement. Discussed activities in water to help and she likes to do water aerobics.   HTN: Checked BP this am, 140/89, first time checked in awhile.  She noted when she checked her blood pressure a couple weeks ago after an exercise regimen, it was very good. -does take medications as prescribed - current regimen includes Losartan 100mg , metoprolol and HCTZ   - taking medications as instructed, no medication side effects noted, no chest pain, palps, no dyspnea on exertion, occas swelling of ankle on side had TKR about 4 months ago,  - diet discussed - not doing great. Eating a balanced diet, but desserts killing her, just had a birthday this week. Lives with sister who is underweight and trying to get food around for her has been challenging  BP Readings from Last 3 Encounters:  02/28/20 140/89  11/06/19 (!) 125/58  10/23/19 (!) 156/78   CKD: Last creatine good Lab Results  Component Value Date   CREATININE 0.83 10/23/2019    HLD: Taking simvastatin; no chest pain, shortness of breath or myalgias.  She has been tolerating without issue.  Last Lipids are at goal today. On ASA 81mg  daily  Lab Results  Component Value Date   CHOL 189 04/17/2019   HDL 87 04/17/2019   LDLCALC 85 04/17/2019   TRIG 76 04/17/2019   CHOLHDL 2.2  04/17/2019      Patient Active Problem List   Diagnosis Date Noted  . Neck pain, chronic 01/15/2020  . Total knee replacement status 11/04/2019  . Cervical radiculitis 08/12/2019  . Dysfunction of Eustachian tube, left 08/12/2019  . Chronic jaw pain 08/12/2019  . Primary osteoarthritis of right knee 03/17/2019  . Scoliosis (and kyphoscoliosis), idiopathic 03/17/2019  . Status post total shoulder arthroplasty 08/09/2018  . Hyponatremia 03/31/2018  . Chronic right shoulder pain 09/26/2017  . CKD (chronic kidney disease) stage 2, GFR 60-89 ml/min 07/11/2017  . Osteopenia 06/22/2017  . 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 Shoulder pain, bilateral 09/08/2016  . Need for prophylactic vaccination with Streptococcus pneumoniae (Pneumococcus) and Influenza vaccines 08/16/2016  . Urinary frequency 08/16/2016  . Cutaneous skin tags 04/18/2016  . Accidental fall 12/09/2015  . Hyperlipidemia LDL goal <100 09/17/2015  . Degenerative arthritis of lumbar spine with cord compression 07/16/2015  . Lumbar and sacral osteoarthritis 07/16/2015  . Bilateral change in hearing 05/27/2015  . Spinal stenosis, multilevel 05/27/2015  . Chronic radicular low back pain 05/27/2015  . Allergic rhinitis 01/06/2014  . Osteoarthrosis involving multiple sites 01/06/2014  . Arthropathia 01/06/2014  . Hypertension goal BP (blood pressure) < 140/90 01/06/2014  . Arthritis of right knee 01/06/2014    Past Surgical History:  Procedure Laterality Date  .  THUMB SURGERY Left   . APPENDECTOMY    . BREAST BIOPSY Right 1992   neg  . BREAST LUMPECTOMY Right   . BUNIONECTOMY Bilateral   . CERVICAL DISCECTOMY  09/18/2012   C4-C5 AND C6-C7 ACDF   . JOINT REPLACEMENT  2008, 2019   thumb, shoulder  . KNEE ARTHROPLASTY Right 11/04/2019   Procedure: COMPUTER ASSISTED TOTAL KNEE ARTHROPLASTY;  Surgeon: Dereck Leep, MD;  Location: [facility];  Service: Orthopedics;  Laterality: Right;  .  SHOULDER ARTHROSCOPY WITH ROTATOR CUFF REPAIR AND OPEN BICEPS TENODESIS Right 12/20/2017   Procedure: SHOULDER ARTHROSCOPY WITH DEBRIDEMENT, DECOMPRESSION AND  BICEPS TENODESIS;  Surgeon: Corky Mull, MD;  Location: [place];  Service: Orthopedics;  Laterality: Right;  . SPINE SURGERY  2013   cervical  . TOTAL SHOULDER ARTHROPLASTY Right 08/09/2018   Procedure: RIGHT TOTAL SHOULDER ARTHROPLASTY;  Surgeon: Justice Britain, MD;  Location: [place];  Service: Orthopedics;  Laterality: Right;  . TUBAL LIGATION Bilateral     Family History  Problem Relation Age of Onset  . Heart disease Mother   . Heart attack Mother   . Arthritis Mother   . Heart disease Father   . Heart disease Sister   . Arthritis Sister   . Heart disease Brother   . Heart failure Brother   . Memory loss Brother   . Arthritis Maternal Grandmother   . Diabetes Paternal Grandmother   . Pulmonary embolism Sister   . Breast cancer Neg Hx     Social History   Tobacco Use  . Smoking status: Never Smoker  . Smokeless tobacco: Never Used  .  Tobacco comment: smoking cessation materials not required  Substance Use Topics  . Alcohol use: Not Currently    Comment: alcohol raises BP     Current Outpatient Medications:  .  acetaminophen (TYLENOL) 500 MG tablet, Take 1,000 mg by mouth 2 (two) times daily., Disp: , Rfl:  .  carbamide peroxide (DEBROX) 6.5 % OTIC solution, 5 drops 2 (two) times daily as needed., Disp: , Rfl:  .  cholecalciferol (VITAMIN D) 1000 units tablet, Take 1 tablet (1,000 Units total) by mouth daily., Disp: , Rfl:  .  docusate sodium (COLACE) 100 MG capsule, Take 100 mg by mouth daily., Disp: , Rfl:  .  fluticasone (FLONASE) 50 MCG/ACT nasal spray, Place 1 spray into both nostrils daily., Disp: , Rfl:  .  hydrochlorothiazide (MICROZIDE) 12.5 MG capsule, Take 1 capsule (12.5 mg total) by mouth daily., Disp: 90 capsule, Rfl: 0 .  losartan (COZAAR) 100 MG tablet, Take 1 tablet (100 mg total) by  mouth daily., Disp: 90 tablet, Rfl: 1 .  Lysine 500 MG TABS, Take by mouth 2 (two) times daily as needed., Disp: , Rfl:  .  Magnesium Oxide 500 MG TABS, Take 1 tablet by mouth daily., Disp: , Rfl:  .  metoprolol succinate (TOPROL-XL) 100 MG 24 hr tablet, Take 1 tablet (100 mg total) by mouth daily. Take with or immediately following a meal., Disp: 90 tablet, Rfl: 0 .  Multiple Vitamins-Minerals (MULTIVITAMIN PO), Take 1 tablet by mouth daily., Disp: , Rfl:  .  Naproxen Sodium 220 MG CAPS, Take 1 capsule (220 mg total) by mouth 2 (two) times daily as needed (Pain)., Disp: 180 capsule, Rfl: 1 .  oxymetazoline (AFRIN) 0.05 % nasal spray, Place 1 spray into both nostrils 2 (two) times daily as needed for congestion., Disp: , Rfl:  .  predniSONE (DELTASONE) 10 MG tablet, Take 4 tablets daily x two days, then 2 tablets daily x two days, then 1 tablet daily x two days, Disp: 14 tablet, Rfl: 0 .  ranitidine (ZANTAC) 150 MG capsule, Take 150 mg by mouth 2 (two) times daily as needed for heartburn., Disp: , Rfl:  .  simvastatin (ZOCOR) 10 MG tablet, Take 1 tablet (10 mg total) by mouth daily. (Patient taking differently: Take 10 mg by mouth daily at 6 PM. ), Disp: 90 tablet, Rfl: 1 .  tiZANidine (ZANAFLEX) 4 MG tablet, TAKE 1 TABLET (4 MG TOTAL) BY MOUTH EVERY 8 (EIGHT) HOURS AS NEEDED FOR MUSCLE SPASMS., Disp: 90 tablet, Rfl: 0 .  amoxicillin (AMOXIL) 500 MG capsule, Take 500 mg by mouth once. Prior to dental appt, Disp: , Rfl:  .  predniSONE (DELTASONE) 10 MG tablet, Take three tabs daily X 3 days, then two tabs daily X 3 days, then one tab daily X 3 days, Disp: 18 tablet, Rfl: 0  Allergies  Allergen Reactions  . Codeine Itching and Other (See Comments)    nightmare  . Oxycodone Itching    With staff assistance, above reviewed with the patient today.   ROS: As per HPI, otherwise no specific complaints on a limited and focused system review   Objective  Virtual encounter, vitals not  obtained.  Body mass index is 24.45 kg/m.  Physical Exam  Patient appears in NAD Breathing: Effort normal. No respiratory distress. Speaking in complete sentences Neurological: Pt is alert and oriented,  Speech is normal.  Psychiatric: Patient has a normal mood and affect.  Very appropriate with conversation, judgment and thought content normal.  PHQ2/9: Depression screen [facility] 2/9 02/28/2020 01/15/2020 10/01/2019 08/12/2019 06/04/2019  Decreased Interest 0 0 0 0 0  Down, Depressed, Hopeless 0 0 0 0 0  PHQ - 2 Score 0 0 0 0 0  Altered sleeping 1 1 - 0 0  Tired, decreased energy 0 0 - 0 0  Change in appetite 0 0 - 0 0  Feeling bad or failure about yourself  0 0 - 0 0  Trouble concentrating 0 0 - 0 0  Moving slowly or fidgety/restless 0 0 - 0 0  Suicidal thoughts 0 0 - 0 0  PHQ-9 Score 1 1 - 0 0  Difficult doing work/chores Not difficult at all Not difficult at all - Not difficult at all Not difficult at all   PHQ-2/9 Result reviewed   Fall Risk: Fall Risk  02/28/2020 01/15/2020 10/01/2019 08/12/2019 06/04/2019  Falls in the past year? 0 0 0 0 0  Number falls in past yr: 0 0 0 0 0  Injury with Fall? 0 0 0 0 0  Risk for fall due to : - - - - -  Risk for fall due to: Comment - - - - -  Follow up - - Falls prevention discussed Falls evaluation completed Falls evaluation completed     Assessment & Plan 1. Hyperlipidemia LDL goal <100 Refilled the statin product, and to continue taking at nighttime as she does.  We will have a follow-up lipid panel when she follows up in April when she is back in the area. - simvastatin (ZOCOR) 10 MG tablet; Take 1 tablet (10 mg total) by mouth daily.  Dispense: 90 tablet; Refill: 1  2. Hypertension goal BP (blood pressure) < 140/90 Blood pressure borderline on check today.  One prior was good per her reports a couple weeks ago.  She notes it has been well controlled on her medication regimen to date.  Refill the losartan for her a couple days ago and  will continue her current medication regimen. Asked that she check her blood pressure once or twice a week, and write them down, can put them in her phone if convenient, and when she returns for follow-up, will have these numbers to review. Did again discuss importance of dietary modifications and staying active to help with her blood pressure and cholesterol, and also for weight management.  3. Hip pain Concerned she may need a total hip replacement noted by the provider in [place], and also she noted previously by Dr. Marry Guan from orthopedics here.  She has a follow-up scheduled with Dr. Marry Guan upon return for assessment, and possible hip replacement in the future.  Is getting a cortisone injection in [place] to help presently.  4. Neck pain, chronic She notes her neck has remained better since she had the courses of prednisone, and remains off of the muscle relaxer, as she had some concerns with dreams and may be related to that Recommended the importance of remaining active with neck exercises daily, and warm compresses can be helpful as well, and if more uncomfortable later at night, can apply cold topically to minimize inflammation.  5. Status post total knee replacement, unspecified laterality Discussed activity in the water as an option to help stay active, and also lessen hip pain issues. Also can apply ice after activities to the need to help keep the inflammation down  Follow-up here in April as planned when back in the area.  Encouraged Covid vaccine when back.  She plans to get the vaccine  when she returns  I discussed the assessment and treatment plan with the patient. The patient was provided an opportunity to ask questions and all were answered. The patient agreed with the plan and demonstrated an understanding of the instructions.   I provided 20 minutes of non-face-to-face time during this encounter that included discussing at length patient's sx/history, pertinent pmhx,  medications, treatment and follow up plan. This time also included the necessary documentation, orders, and chart review.

## 2020-02-28 ENCOUNTER — Ambulatory Visit (INDEPENDENT_AMBULATORY_CARE_PROVIDER_SITE_OTHER): Payer: Medicare HMO | Admitting: Internal Medicine

## 2020-02-28 ENCOUNTER — Encounter: Payer: Self-pay | Admitting: Internal Medicine

## 2020-02-28 VITALS — BP 140/89 | HR 70 | Ht 63.0 in | Wt 138.0 lb

## 2020-02-28 DIAGNOSIS — I10 Essential (primary) hypertension: Secondary | ICD-10-CM | POA: Diagnosis not present

## 2020-02-28 DIAGNOSIS — M25559 Pain in unspecified hip: Secondary | ICD-10-CM

## 2020-02-28 DIAGNOSIS — Z96659 Presence of unspecified artificial knee joint: Secondary | ICD-10-CM

## 2020-02-28 DIAGNOSIS — M542 Cervicalgia: Secondary | ICD-10-CM | POA: Diagnosis not present

## 2020-02-28 DIAGNOSIS — G8929 Other chronic pain: Secondary | ICD-10-CM

## 2020-02-28 DIAGNOSIS — E785 Hyperlipidemia, unspecified: Secondary | ICD-10-CM

## 2020-02-28 MED ORDER — SIMVASTATIN 10 MG PO TABS: 10.0000 mg | ORAL_TABLET | Freq: Every day | ORAL | 1 refills | Status: DC

## 2020-03-02 DIAGNOSIS — M25552 Pain in left hip: Secondary | ICD-10-CM | POA: Diagnosis not present

## 2020-03-02 DIAGNOSIS — M1612 Unilateral primary osteoarthritis, left hip: Secondary | ICD-10-CM | POA: Diagnosis not present

## 2020-03-11 DIAGNOSIS — M19012 Primary osteoarthritis, left shoulder: Secondary | ICD-10-CM | POA: Diagnosis not present

## 2020-03-11 DIAGNOSIS — M25512 Pain in left shoulder: Secondary | ICD-10-CM | POA: Diagnosis not present

## 2020-03-24 ENCOUNTER — Encounter: Payer: Self-pay | Admitting: Internal Medicine

## 2020-03-25 ENCOUNTER — Encounter: Payer: Self-pay | Admitting: Internal Medicine

## 2020-04-02 DIAGNOSIS — M1711 Unilateral primary osteoarthritis, right knee: Secondary | ICD-10-CM | POA: Diagnosis not present

## 2020-04-02 DIAGNOSIS — M1612 Unilateral primary osteoarthritis, left hip: Secondary | ICD-10-CM | POA: Diagnosis not present

## 2020-04-02 DIAGNOSIS — Z96651 Presence of right artificial knee joint: Secondary | ICD-10-CM | POA: Diagnosis not present

## 2020-04-05 DIAGNOSIS — M1612 Unilateral primary osteoarthritis, left hip: Secondary | ICD-10-CM | POA: Insufficient documentation

## 2020-04-06 NOTE — Progress Notes (Signed)
Patient ID: Rachel James, female    DOB: Sep 11, 1946, 74 y.o.   MRN: [id number]  PCP: Hubbard Hartshorn, FNP  Chief Complaint  Patient presents with  . Follow-up    would like ears checked to see if wax is ok  . Hypertension  . Hyperlipidemia  . Chronic Kidney Disease    Subjective:   Rachel James is a 74 y.o. female, presents to clinic with CC of the following:  Chief Complaint  Patient presents with  . Follow-up    would like ears checked to see if wax is ok  . Hypertension  . Hyperlipidemia  . Chronic Kidney Disease    HPI:  Patient is a 74 y.o. female patient of Raelyn Ensign who follows up today after returning from residing in [place] for the winter months. She has a known history of hypertension, hyperlipidemia, CKD with her last kidney function normal, osteoarthritis with a total knee replacement in November 2020and rehabbing after currently, andcervical radiculitis witha history ofchronicintermittent neck pains in her past.  My last visit with her was 02/28/2020 by phone for prescription refills, with that visit note reviewed. A prior visit in January was noted (my first visit with her) and she was prescribed a short course of steroids for neck pain, and a second course given with for a recurrence after initial improvement. Neck has remained better, and she noted that her neck symptoms resolved after she had a cortisone shot in her left shoulder recently.   She noted prior that she stopped the tizandine due to having dreams of concern.  On follow up today, she notes that her hands and feet are often very cold, and she thought may be related to her BP medicine, metoprolol as she read that it can cause this.  She has been on metoprolol since she has been 58, and states that she has had the symptoms for just about that many years, and even the tip of her nose can get cold.  She states it was happening some in [place] in the warmer weather, and in the past years has  happened in [place] in the warmer weather as well.  She denies any pain when this occurs, and denies any color changes that she notices in her hands (like going from red to white to blue).  She denies any history of a Raynaud's phenomenon.  She denies any history of thyroid disease or diabetes. She does get some intermittent tingling feelings in her distal fingers, she says related to her cervical spine history, and that has not increased at all in the recent past.  [id number] regimen -  Losartan 100mg  daily, Metoprolol - 100mg , HCTZ-12.5mg  Not check Bps at home, although noted that she can. Not miss taking medications  no CP, SOB, palps, occas swelling of ankle on side had TKR although that has improved in the recent past. Diet - struggled with healthy diet in [place], noted frequent desserts. Lived with sister who is underweight and trying to have food around for her was challenging  BP Readings from Last 3 Encounters:  04/07/20 122/86  02/28/20 140/89  11/06/19 (!) 125/58   CKD: Last creatine good Lab Results  Component Value Date   CREATININE 0.83 10/23/2019   HLD: Taking simvastatin;  no chest pain, shortness of breath or myalgias.  Last Lipids at goal.   Lab Results  Component Value Date   CHOL 189 04/17/2019   HDL 87 04/17/2019   LDLCALC 85  04/17/2019   TRIG 76 04/17/2019   CHOLHDL 2.2 04/17/2019     OA - She recently saw orthopedics on 04/02/20 and plans are to proceed with a left total hip procedure in the near future, likely in June. She has done well recovering from her TKR in Nov 2020, and noted she had a shoulder done a year prior.  She also noted after the hip procedure, she may need to have the other shoulder done.   Patient Active Problem List   Diagnosis Date Noted  . Neck pain, chronic 01/15/2020  . Total knee replacement status 11/04/2019  . Cervical radiculitis 08/12/2019  . Dysfunction of Eustachian tube, left 08/12/2019  . Chronic jaw pain  08/12/2019  . Primary osteoarthritis of right knee 03/17/2019  . Scoliosis (and kyphoscoliosis), idiopathic 03/17/2019  . Status post total shoulder arthroplasty 08/09/2018  . Hyponatremia 03/31/2018  . Chronic right shoulder pain 09/26/2017  . CKD (chronic kidney disease) stage 2, GFR 60-89 ml/min 07/11/2017  . Osteopenia 06/22/2017  . 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 Shoulder pain, bilateral 09/08/2016  . Need for prophylactic vaccination with Streptococcus pneumoniae (Pneumococcus) and Influenza vaccines 08/16/2016  . Urinary frequency 08/16/2016  . Cutaneous skin tags 04/18/2016  . Accidental fall 12/09/2015  . Hyperlipidemia LDL goal <100 09/17/2015  . Degenerative arthritis of lumbar spine with cord compression 07/16/2015  . Lumbar and sacral osteoarthritis 07/16/2015  . Bilateral change in hearing 05/27/2015  . Spinal stenosis, multilevel 05/27/2015  . Chronic radicular low back pain 05/27/2015  . Allergic rhinitis 01/06/2014  . Osteoarthrosis involving multiple sites 01/06/2014  . Arthropathia 01/06/2014  . Hypertension goal BP (blood pressure) < 140/90 01/06/2014  . Arthritis of right knee 01/06/2014      Current Outpatient Medications:  .  acetaminophen (TYLENOL) 500 MG tablet, Take 1,000 mg by mouth 2 (two) times daily., Disp: , Rfl:  .  carbamide peroxide (DEBROX) 6.5 % OTIC solution, 5 drops 2 (two) times daily as needed., Disp: , Rfl:  .  cholecalciferol (VITAMIN D) 1000 units tablet, Take 1 tablet (1,000 Units total) by mouth daily., Disp: , Rfl:  .  docusate sodium (COLACE) 100 MG capsule, Take 100 mg by mouth daily., Disp: , Rfl:  .  fluticasone (FLONASE) 50 MCG/ACT nasal spray, Place 1 spray into both nostrils daily., Disp: , Rfl:  .  hydrochlorothiazide (MICROZIDE) 12.5 MG capsule, Take 1 capsule (12.5 mg total) by mouth daily., Disp: 90 capsule, Rfl: 0 .  losartan (COZAAR) 100 MG tablet, Take 1 tablet (100 mg total) by mouth daily.,  Disp: 90 tablet, Rfl: 1 .  Magnesium Oxide 500 MG TABS, Take 1 tablet by mouth daily., Disp: , Rfl:  .  metoprolol succinate (TOPROL-XL) 100 MG 24 hr tablet, Take 1 tablet (100 mg total) by mouth daily. Take with or immediately following a meal., Disp: 90 tablet, Rfl: 0 .  Multiple Vitamins-Minerals (MULTIVITAMIN PO), Take 1 tablet by mouth daily., Disp: , Rfl:  .  Naproxen Sodium 220 MG CAPS, Take 1 capsule (220 mg total) by mouth 2 (two) times daily as needed (Pain)., Disp: 180 capsule, Rfl: 1 .  predniSONE (DELTASONE) 10 MG tablet, Take 4 tablets daily x two days, then 2 tablets daily x two days, then 1 tablet daily x two days, Disp: 14 tablet, Rfl: 0 .  ranitidine (ZANTAC) 150 MG capsule, Take 150 mg by mouth 2 (two) times daily as needed for heartburn., Disp: , Rfl:  .  simvastatin (ZOCOR) 10 MG tablet, Take 1 tablet (10 mg total) by mouth daily., Disp: 90 tablet, Rfl: 1 .  tiZANidine (ZANAFLEX) 4 MG tablet, TAKE 1 TABLET (4 MG TOTAL) BY MOUTH EVERY 8 (EIGHT) HOURS AS NEEDED FOR MUSCLE SPASMS., Disp: 90 tablet, Rfl: 0 .  amoxicillin (AMOXIL) 500 MG capsule, Take 500 mg by mouth once. Prior to dental appt, Disp: , Rfl:  .  Lysine 500 MG TABS, Take by mouth 2 (two) times daily as needed., Disp: , Rfl:    Allergies  Allergen Reactions  . Codeine Itching and Other (See Comments)    nightmare  . Oxycodone Itching     Past Surgical History:  Procedure Laterality Date  .  THUMB SURGERY Left   . APPENDECTOMY    . BREAST BIOPSY Right 1992   neg  . BREAST LUMPECTOMY Right   . BUNIONECTOMY Bilateral   . CERVICAL DISCECTOMY  09/18/2012   C4-C5 AND C6-C7 ACDF   . JOINT REPLACEMENT  2008, 2019   thumb, shoulder  . KNEE ARTHROPLASTY Right 11/04/2019   Procedure: COMPUTER ASSISTED TOTAL KNEE ARTHROPLASTY;  Surgeon: Dereck Leep, MD;  Location: [facility];  Service: Orthopedics;  Laterality: Right;  . SHOULDER ARTHROSCOPY WITH ROTATOR CUFF REPAIR AND OPEN BICEPS TENODESIS Right 12/20/2017    Procedure: SHOULDER ARTHROSCOPY WITH DEBRIDEMENT, DECOMPRESSION AND  BICEPS TENODESIS;  Surgeon: Corky Mull, MD;  Location: [facility];  Service: Orthopedics;  Laterality: Right;  . SPINE SURGERY  2013   cervical  . TOTAL SHOULDER ARTHROPLASTY Right 08/09/2018   Procedure: RIGHT TOTAL SHOULDER ARTHROPLASTY;  Surgeon: Justice Britain, MD;  Location: [facility];  Service: Orthopedics;  Laterality: Right;  . TUBAL LIGATION Bilateral      Family History  Problem Relation Age of Onset  . Heart disease Mother   . Heart attack Mother   . Arthritis Mother   . Heart disease Father   . Heart disease Sister   . Arthritis Sister   . Heart disease Brother   . Heart failure Brother   . Memory loss Brother   . Arthritis Maternal Grandmother   . Diabetes Paternal Grandmother   . Pulmonary embolism Sister   . Breast cancer Neg Hx      Social History   Tobacco Use  . Smoking status: Never Smoker  . Smokeless tobacco: Never Used  . Tobacco comment: smoking cessation materials not required  Substance Use Topics  . Alcohol use: Not Currently    Comment: alcohol raises BP    With staff assistance, above reviewed with the patient today.  ROS: As per HPI, otherwise no specific complaints on a limited and focused system review   No results found for this or any previous visit (from the past 72 hour(s)).   PHQ2/9: Depression screen [facility] 2/9 04/07/2020 02/28/2020 01/15/2020 10/01/2019 08/12/2019  Decreased Interest 0 0 0 0 0  Down, Depressed, Hopeless 0 0 0 0 0  PHQ - 2 Score 0 0 0 0 0  Altered sleeping 0 1 1 - 0  Tired, decreased energy 0 0 0 - 0  Change in appetite 0 0 0 - 0  Feeling bad or failure about yourself  0 0 0 - 0  Trouble concentrating 0 0 0 - 0  Moving slowly or fidgety/restless 0 0 0 - 0  Suicidal thoughts 0 0 0 - 0  PHQ-9 Score 0 1 1 - 0  Difficult doing work/chores Not difficult at all Not difficult at all  Not difficult at all - Not difficult at all  Some recent data  might be hidden   PHQ-2/9 Result is neg  Fall Risk: Fall Risk  04/07/2020 02/28/2020 01/15/2020 10/01/2019 08/12/2019  Falls in the past year? 0 0 0 0 0  Number falls in past yr: 0 0 0 0 0  Injury with Fall? 0 0 0 0 0  Risk for fall due to : - - - - -  Risk for fall due to: Comment - - - - -  Follow up - - - Falls prevention discussed Falls evaluation completed      Objective:   Vitals:   04/07/20 0944  BP: 122/86  Pulse: 73  Resp: 14  Temp: 97.9 F (36.6 C)  TempSrc: Temporal  SpO2: 96%  Weight: 140 lb 12.8 oz (63.9 kg)  Height: 5\' 3"  (1.6 m)    Body mass index is 24.94 kg/m.  Physical Exam   NAD, masked HEENT - [place]/AT, sclera anicteric, PERRL, EOMI, conj - non-inj'ed, TM's and canals clear with no marked cerumen accumulation, pharynx clear Neck - supple, no adenopathy, no TM, carotids 2+ and = without bruits bilat Car - RRR without m/g/r Pulm- RR and effort normal at rest, CTA without wheeze or rales Abd - soft, NT, ND, BS+,  no masses, no hepatosplenomegaly Back - no CVA tenderness Ext - no LE edema, no active joints on her hands, pulses good in the wrist bilateral, good capillary refill bilateral hands, sensation intact to light touch over the hands and distal upper extremities, and good grip strength noted.  Hands were warm to the touch today. Neuro/psychiatric - affect was not flat, appropriate with conversation  Alert and oriented  Grossly non-focal - good strength on testing extremities, sensation intact to LT in distal extremities, DTRs 2+ and equal in the patella, no pronator drift, good finger-to-nose,  Speech and gait are normal       Assessment & Plan:    1. Hypertension goal BP (blood pressure) < 140/90 Blood pressure good on check today. Continue her current medication regimen. We will check some labs today as well. - BASIC METABOLIC PANEL WITH GFR  2. Hyperlipidemia LDL goal <100 On a low-dose statin presently and continue No myalgia concerns in  the recent past. Check labs today. - BASIC METABOLIC PANEL WITH GFR - TSH - Lipid panel  3. Primary osteoarthritis involving multiple joints Plans for an upcoming hip replacement noted, after recently having a total knee and a prior shoulder the year prior. Discussed concerns trying to wean and stop the beta-blocker prior to her planned upcoming surgery, especially noting that it seems unlikely that the beta-blocker is the cause of her cold feelings in the distal extremities that have been going on for years. She was understanding and in agreement with that today.  4. CKD (chronic kidney disease) stage 2, GFR 60-89 ml/min Continue to monitor, and recheck labs today - BASIC METABOLIC PANEL WITH GFR  5. Cold extremities Discussed at length this issue today, does not seem to be a circulatory concern, and encouraged no ischemic concerns by history or assessment.  We will check a thyroid screen today as well as some other labs again.  She has had some numbness related to her cervical spine disease, although not convinced the coldness component is related, especially as she feels it sometimes in her distal feet and nose.  Discussed how as we get older, this can occur, and often skin thickness as we age  can be a contributor.  Again, seems unlikely it is related to the metoprolol product, and felt timing wise that it is best not to try to start a wean with the eventual stoppage of the medicine prior to her planned upcoming surgery.  Also, we are heading into warmer weather months here which is helped will be helpful. Await lab test, and if okay, will continue to monitor, and recommended wearing gloves and warm slippers in cooler weather to help in the short-term. - BASIC METABOLIC PANEL WITH GFR - TSH - CBC with Differential/Platelet  6. Cervical radiculitis As noted above.  Await lab results presently, and can follow-up if symptoms more concerning over time.  Timing of more routine follow-up  pending lab results from today.       Towanda Malkin, MD 04/07/20 9:55 AM

## 2020-04-07 ENCOUNTER — Ambulatory Visit (INDEPENDENT_AMBULATORY_CARE_PROVIDER_SITE_OTHER): Payer: Medicare HMO | Admitting: Internal Medicine

## 2020-04-07 ENCOUNTER — Encounter: Payer: Self-pay | Admitting: Internal Medicine

## 2020-04-07 ENCOUNTER — Other Ambulatory Visit: Payer: Self-pay

## 2020-04-07 VITALS — BP 122/86 | HR 73 | Temp 97.9°F | Resp 14 | Ht 63.0 in | Wt 140.8 lb

## 2020-04-07 DIAGNOSIS — R6889 Other general symptoms and signs: Secondary | ICD-10-CM

## 2020-04-07 DIAGNOSIS — M8949 Other hypertrophic osteoarthropathy, multiple sites: Secondary | ICD-10-CM

## 2020-04-07 DIAGNOSIS — E785 Hyperlipidemia, unspecified: Secondary | ICD-10-CM | POA: Diagnosis not present

## 2020-04-07 DIAGNOSIS — I10 Essential (primary) hypertension: Secondary | ICD-10-CM

## 2020-04-07 DIAGNOSIS — M5412 Radiculopathy, cervical region: Secondary | ICD-10-CM

## 2020-04-07 DIAGNOSIS — R209 Unspecified disturbances of skin sensation: Secondary | ICD-10-CM | POA: Insufficient documentation

## 2020-04-07 DIAGNOSIS — N182 Chronic kidney disease, stage 2 (mild): Secondary | ICD-10-CM

## 2020-04-07 DIAGNOSIS — M159 Polyosteoarthritis, unspecified: Secondary | ICD-10-CM

## 2020-04-08 LAB — LIPID PANEL
Cholesterol: 204 mg/dL — ABNORMAL HIGH (ref <200)
HDL: 87 mg/dL (ref >=50)
LDL Cholesterol (Calc): 101 mg/dL (calc) — ABNORMAL HIGH
Non-HDL Cholesterol (Calc): 117 mg/dL (calc) (ref <130)
Total CHOL/HDL Ratio: 2.3 (calc) (ref <5.0)
Triglycerides: 74 mg/dL (ref <150)

## 2020-04-08 LAB — CBC WITH DIFFERENTIAL/PLATELET
Absolute Monocytes: 546 cells/uL (ref 200–950)
Basophils Absolute: 11 cells/uL (ref 0–200)
Basophils Relative: 0.2 %
Eosinophils Absolute: 101 cells/uL (ref 15–500)
Eosinophils Relative: 1.9 %
HCT: 37.2 % (ref 35.0–45.0)
Hemoglobin: 12.8 g/dL (ref 11.7–15.5)
Lymphs Abs: 917 cells/uL (ref 850–3900)
MCH: 31 pg (ref 27.0–33.0)
MCHC: 34.4 g/dL (ref 32.0–36.0)
MCV: 90.1 fL (ref 80.0–100.0)
MPV: 10.5 fL (ref 7.5–12.5)
Monocytes Relative: 10.3 %
Neutro Abs: 3726 cells/uL (ref 1500–7800)
Neutrophils Relative %: 70.3 %
Platelets: 198 10*3/uL (ref 140–400)
RBC: 4.13 10*6/uL (ref 3.80–5.10)
RDW: 14.1 % (ref 11.0–15.0)
Total Lymphocyte: 17.3 %
WBC: 5.3 10*3/uL (ref 3.8–10.8)

## 2020-04-08 LAB — TSH: TSH: 1.04 mIU/L (ref 0.40–4.50)

## 2020-04-08 LAB — BASIC METABOLIC PANEL WITH GFR
BUN: 15 mg/dL (ref 7–25)
CO2: 29 mmol/L (ref 20–32)
Calcium: 9.6 mg/dL (ref 8.6–10.4)
Chloride: 99 mmol/L (ref 98–110)
Creat: 0.84 mg/dL (ref 0.60–0.93)
GFR, Est African American: 80 mL/min/{1.73_m2} (ref >=60)
GFR, Est Non African American: 69 mL/min/{1.73_m2} (ref >=60)
Glucose, Bld: 81 mg/dL (ref 65–99)
Potassium: 4.2 mmol/L (ref 3.5–5.3)
Sodium: 135 mmol/L (ref 135–146)

## 2020-04-20 ENCOUNTER — Other Ambulatory Visit: Payer: Self-pay | Admitting: Family Medicine

## 2020-04-20 DIAGNOSIS — I10 Essential (primary) hypertension: Secondary | ICD-10-CM

## 2020-04-28 ENCOUNTER — Encounter: Payer: Self-pay | Admitting: Internal Medicine

## 2020-04-28 DIAGNOSIS — Z20828 Contact with and (suspected) exposure to other viral communicable diseases: Secondary | ICD-10-CM | POA: Diagnosis not present

## 2020-04-28 DIAGNOSIS — Z03818 Encounter for observation for suspected exposure to other biological agents ruled out: Secondary | ICD-10-CM | POA: Diagnosis not present

## 2020-04-29 ENCOUNTER — Encounter: Payer: Self-pay | Admitting: Internal Medicine

## 2020-04-29 ENCOUNTER — Other Ambulatory Visit: Payer: Self-pay

## 2020-04-29 ENCOUNTER — Ambulatory Visit (INDEPENDENT_AMBULATORY_CARE_PROVIDER_SITE_OTHER): Payer: Medicare HMO | Admitting: Internal Medicine

## 2020-04-29 VITALS — Ht 63.0 in | Wt 140.0 lb

## 2020-04-29 DIAGNOSIS — R059 Cough, unspecified: Secondary | ICD-10-CM

## 2020-04-29 DIAGNOSIS — R05 Cough: Secondary | ICD-10-CM

## 2020-04-29 DIAGNOSIS — J069 Acute upper respiratory infection, unspecified: Secondary | ICD-10-CM | POA: Diagnosis not present

## 2020-04-29 NOTE — Progress Notes (Signed)
Name: Rachel James   MRN: [id number]    DOB: 1946-11-25   Date:04/29/2020       Progress Note  Subjective  Chief Complaint  Chief Complaint  Patient presents with  . Cough    coughing and sneezing onset about 4 days ago  . Fatigue    Covid tested 04/28/20 it was negative  . Sore Throat    I connected with  [name] on 04/29/20 at  1:20 PM EDT by telephone and verified that I am speaking with the correct person using two identifiers. I was having technical difficulty with the video component, and called the patient and she agreed to continue by phone I discussed the limitations, risks, security and privacy concerns of performing an evaluation and management service by telephone and the availability of in person appointments. Staff also discussed with the patient that there may be a patient responsible charge related to this service. Patient Location: Home Provider Location: [facility] Additional Individuals present: none  HPI Patient is a 74 year old female patient of Raelyn Ensign who I last saw on 04/07/2020 with that note reviewed.   She contacted the office yesterday with this message:  I've been having a mild, dry cough for a couple of months that I attributed to allergies. Friday I started with a frequent dry cough that by Sunday evening has been producing a small amount of mucus.  Also have a runny nose, occasional sneezing and irritated throat. The mucus I'm coughing up has that taste of infection. Mucinex seems to help with the coughing. I have had sinus infections in the past. Do you need to see me in person to know how to treat this?  Thank you for your attention. She did go to the [facility] yesterday to get tested for Covid, and that result was negative.  I did note that it was very early in her potential infection, which can sometimes cause a false negative test  She noted that her mild dry cough has been present for the past couple months, but over the weekend, her cough seemed  to become more problematic. Was more frequent and forceful, Min production at first, more mucus in past 2-3 days mostly in morning, taste like has infection noted, but notes mostly dry cough during day No marked SOB No fever, not feeling feverish + sore throat.  +mild congestion, nose is runny, not painful with pressure on sinuses No loss of smell, loss of taste No N/V No muscle aches, feels more tired than usual, "not really fatigued" No marked loose stools/diarrhea No CP, passing out episodes Does get some allergy symptoms with change of seasons, and does have a nasal spray to take, Flonase Has tried Mucinex in the last 24 hours, a couple doses, and notes that has been helpful.  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 Had covid vaccine, last one April 27  Patient Active Problem List   Diagnosis Date Noted  . Cold extremities 04/07/2020  . Neck pain, chronic 01/15/2020  . Total knee replacement status 11/04/2019  . Cervical radiculitis 08/12/2019  . Dysfunction of Eustachian tube, left 08/12/2019  . Chronic jaw pain 08/12/2019  . Primary osteoarthritis of right knee 03/17/2019  . Scoliosis (and kyphoscoliosis), idiopathic 03/17/2019  . Status post total shoulder arthroplasty 08/09/2018  . Hyponatremia 03/31/2018  . Chronic right  shoulder pain 09/26/2017  . CKD (chronic kidney disease) stage 2, GFR 60-89 ml/min 07/11/2017  . Osteopenia 06/22/2017  . 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 Shoulder pain, bilateral 09/08/2016  . Need for prophylactic vaccination with Streptococcus pneumoniae (Pneumococcus) and Influenza vaccines 08/16/2016  . Urinary frequency 08/16/2016  . Cutaneous skin tags 04/18/2016  . Accidental fall 12/09/2015  . Hyperlipidemia LDL goal <100 09/17/2015  . Degenerative arthritis of  lumbar spine with cord compression 07/16/2015  . Lumbar and sacral osteoarthritis 07/16/2015  . Bilateral change in hearing 05/27/2015  . Spinal stenosis, multilevel 05/27/2015  . Chronic radicular low back pain 05/27/2015  . Allergic rhinitis 01/06/2014  . Osteoarthrosis involving multiple sites 01/06/2014  . Arthropathia 01/06/2014  . Hypertension goal BP (blood pressure) < 140/90 01/06/2014  . Arthritis of right knee 01/06/2014    Past Surgical History:  Procedure Laterality Date  .  THUMB SURGERY Left   . APPENDECTOMY    . BREAST BIOPSY Right 1992   neg  . BREAST LUMPECTOMY Right   . BUNIONECTOMY Bilateral   . CERVICAL DISCECTOMY  09/18/2012   C4-C5 AND C6-C7 ACDF   . JOINT REPLACEMENT  2008, 2019   thumb, shoulder  . KNEE ARTHROPLASTY Right 11/04/2019   Procedure: COMPUTER ASSISTED TOTAL KNEE ARTHROPLASTY;  Surgeon: Dereck Leep, MD;  Location: [facility];  Service: Orthopedics;  Laterality: Right;  . SHOULDER ARTHROSCOPY WITH ROTATOR CUFF REPAIR AND OPEN BICEPS TENODESIS Right 12/20/2017   Procedure: SHOULDER ARTHROSCOPY WITH DEBRIDEMENT, DECOMPRESSION AND  BICEPS TENODESIS;  Surgeon: Corky Mull, MD;  Location: [place];  Service: Orthopedics;  Laterality: Right;  . SPINE SURGERY  2013   cervical  . TOTAL SHOULDER ARTHROPLASTY Right 08/09/2018   Procedure: RIGHT TOTAL SHOULDER ARTHROPLASTY;  Surgeon: Justice Britain, MD;  Location: [place];  Service: Orthopedics;  Laterality: Right;  . TUBAL LIGATION Bilateral     Family History  Problem Relation Age of Onset  . Heart disease Mother   . Heart attack Mother   . Arthritis Mother   . Heart disease Father   . Heart disease Sister   . Arthritis Sister   . Heart disease Brother   . Heart failure Brother   . Memory loss Brother   . Arthritis Maternal Grandmother   . Diabetes Paternal Grandmother   . Pulmonary embolism Sister   . Breast cancer Neg Hx     Social History   Tobacco Use  . Smoking status: Never  Smoker  . Smokeless tobacco: Never Used  . Tobacco comment: smoking cessation materials not required  Substance Use Topics  . Alcohol use: Not Currently    Comment: alcohol raises BP     Current Outpatient Medications:  .  acetaminophen (TYLENOL) 500 MG tablet, Take 1,000 mg by mouth 2 (two) times daily., Disp: , Rfl:  .  amoxicillin (AMOXIL) 500 MG capsule, Take 500 mg by mouth once. Prior to dental appt, Disp: , Rfl:  .  carbamide peroxide (DEBROX) 6.5 % OTIC solution, 5 drops 2 (two) times daily as needed., Disp: , Rfl:  .  cholecalciferol (VITAMIN D) 1000 units tablet, Take 1 tablet (1,000 Units total) by mouth daily., Disp: , Rfl:  .  docusate sodium (COLACE) 100 MG capsule, Take 100 mg by mouth daily as needed. , Disp: , Rfl:  .  fluticasone (FLONASE) 50 MCG/ACT nasal spray, Place 1 spray into both nostrils daily. Uses as needed, Disp: , Rfl:  .  hydrochlorothiazide (MICROZIDE) 12.5 MG capsule, TAKE 1 CAPSULE BY MOUTH EVERY DAY, Disp: 90 capsule, Rfl: 1 .  losartan (COZAAR) 100 MG tablet, Take 1 tablet (100 mg total) by mouth daily., Disp: 90 tablet, Rfl: 1 .  Lysine 500 MG TABS, Take by mouth 2 (two) times daily as needed., Disp: , Rfl:  .  Magnesium Oxide 500 MG TABS, Take 1 tablet by mouth daily., Disp: , Rfl:  .  metoprolol succinate (TOPROL-XL) 100 MG 24 hr tablet, Take 1 tablet (100 mg total) by mouth daily. Take with or immediately following a meal., Disp: 90 tablet, Rfl: 0 .  Multiple Vitamins-Minerals (MULTIVITAMIN PO), Take 1 tablet by mouth daily., Disp: , Rfl:  .  Naproxen Sodium 220 MG CAPS, Take 1 capsule (220 mg total) by mouth 2 (two) times daily as needed (Pain)., Disp: 180 capsule, Rfl: 1 .  ranitidine (ZANTAC) 150 MG capsule, Take 150 mg by mouth 2 (two) times daily as needed for heartburn., Disp: , Rfl:  .  simvastatin (ZOCOR) 10 MG tablet, Take 1 tablet (10 mg total) by mouth daily., Disp: 90 tablet, Rfl: 1 .  tiZANidine (ZANAFLEX) 4 MG tablet, TAKE 1 TABLET (4 MG  TOTAL) BY MOUTH EVERY 8 (EIGHT) HOURS AS NEEDED FOR MUSCLE SPASMS., Disp: 90 tablet, Rfl: 0  Allergies  Allergen Reactions  . Codeine Itching and Other (See Comments)    nightmare  . Oxycodone Itching    With staff assistance, above reviewed with the patient today.  ROS: As per HPI, otherwise no specific complaints on a limited and focused system review   Objective  Virtual encounter, vitals not obtained.  Body mass index is 24.8 kg/m.  Physical Exam   Appears in NAD via conversation When she presses on her sinuses, she notes it is not painful Breathing: No obvious respiratory distress. Speaking in complete sentences Neurological: Pt is alert and oriented, Speech is normal Psychiatric: Patient has a normal mood and affect, Judgment and thought content normal.   No results found for this or any previous visit (from the past 72 hour(s)).  PHQ2/9: Depression screen [facility] 2/9 04/29/2020 04/07/2020 02/28/2020 01/15/2020 10/01/2019  Decreased Interest 0 0 0 0 0  Down, Depressed, Hopeless 0 0 0 0 0  PHQ - 2 Score 0 0 0 0 0  Altered sleeping 0 0 1 1 -  Tired, decreased energy 1 0 0 0 -  Change in appetite 0 0 0 0 -  Feeling bad or failure about yourself  0 0 0 0 -  Trouble concentrating 0 0 0 0 -  Moving slowly or fidgety/restless 0 0 0 0 -  Suicidal thoughts 0 0 0 0 -  PHQ-9 Score 1 0 1 1 -  Difficult doing work/chores Not difficult at all Not difficult at all Not difficult at all Not difficult at all -  Some recent data might be hidden   PHQ-2/9 Result reviewed  Fall Risk: Fall Risk  04/29/2020 04/07/2020 02/28/2020 01/15/2020 10/01/2019  Falls in the past year? 0 0 0 0 0  Number falls in past yr: 0 0 0 0 0  Injury with Fall? 0 0 0 0 0  Risk for fall due to : - - - - -  Risk for fall due to: Comment - - - - -  Follow up - - - - Falls prevention discussed     Assessment & Plan 1.URI - good possibility viral 2. Cough  Educated, she did get a Covid test yesterday  which was  negative.  Seems less likely Covid presently, and she did have the vaccination as noted.  Do feel it is likely she has a mild infectious component in addition to allergy symptoms, and most of these are usually viral.  She has not had fevers, no marked discolored phlegm or marked production with the cough, no pain when pressing her sinuses, and all in all noted she does not feel all that ill.  Just a little more tired than usual Discussed why best not to rest, antibiotic presently, and closely monitor for the next 2 to 3 days with symptomatic measures recommended. Symptomatic measures with OTC expectorant like a Mucinex product she is taking to continue ibuprofen or acetaminophen products prn, with the acetaminophen product probably safer and noted as the best to take presently as needed Fluticasone generic - 1 spray to each nostril daily also can be helpful Rest and increased fluids recommended, and would not go to the soup kitchen tomorrow either, and not return until her symptoms have significantly improved. If her symptoms are worsening, and especially if she starts to develop fevers, more production to her cough, any shortness of breath or chest pains, more fatigued or ill appearing, or just not improving over the next 2 days, she should follow-up.  She was understanding of that. She noted she was more concerned as she has a history of sinus infections, and whether it was there is just a cold, and emphasized the importance of closely monitoring presently.   I discussed the assessment and treatment plan with the patient. The patient was provided an opportunity to ask questions and all were answered. The patient agreed with the plan and demonstrated an understanding of the instructions.   The patient was advised to call back or seek an in-person evaluation if the symptoms worsen or if the condition fails to improve as anticipated.  I provided 15 minutes of non-face-to-face time during this encounter  that included discussing at length patient's sx/history, pertinent pmhx, medications, treatment and follow up plan. This time also included the necessary documentation, orders, and chart review.  Towanda Malkin, MD

## 2020-04-30 ENCOUNTER — Other Ambulatory Visit: Payer: Self-pay

## 2020-04-30 DIAGNOSIS — I10 Essential (primary) hypertension: Secondary | ICD-10-CM

## 2020-04-30 MED ORDER — METOPROLOL SUCCINATE ER 100 MG PO TB24: 100.0000 mg | ORAL_TABLET | Freq: Every day | ORAL | 1 refills | Status: DC

## 2020-05-04 ENCOUNTER — Encounter: Payer: Self-pay | Admitting: Internal Medicine

## 2020-05-21 ENCOUNTER — Encounter: Payer: Self-pay | Admitting: Internal Medicine

## 2020-05-21 ENCOUNTER — Other Ambulatory Visit: Payer: Self-pay | Admitting: Internal Medicine

## 2020-05-21 DIAGNOSIS — K219 Gastro-esophageal reflux disease without esophagitis: Secondary | ICD-10-CM

## 2020-05-21 MED ORDER — FAMOTIDINE 40 MG PO TABS: 40.0000 mg | ORAL_TABLET | Freq: Two times a day (BID) | ORAL | 3 refills | Status: DC | PRN

## 2020-05-21 NOTE — Progress Notes (Signed)
Patient was taking ranitidine for heartburn symptoms as needed, and that was taken off the market. Changed to Pepcid product twice daily as needed.

## 2020-05-22 NOTE — Progress Notes (Signed)
Mychart message sent to patient to inform her new medication has been sent.   Last read by [name] at 9:17 AM on 05/22/2020.

## 2020-05-25 ENCOUNTER — Encounter: Payer: Self-pay | Admitting: Internal Medicine

## 2020-05-27 NOTE — Discharge Instructions (Signed)
Instructions after Total Hip Replacement     Tylen Leverich P. Hanifa Antonetti, Jr., M.D.     Dept. of Orthopaedics & Sports Medicine  [facility]  [address]  [place], [place]  [address]  Phone: [phone]   Fax: [phone]    DIET: . Drink plenty of non-alcoholic fluids. . Resume your normal diet. Include foods high in fiber.  ACTIVITY:  . You may use crutches or a walker with weight-bearing as tolerated, unless instructed otherwise. . You may be weaned off of the walker or crutches by your Physical Therapist.  . Do NOT reach below the level of your knees or cross your legs until allowed.    . Continue doing gentle exercises. Exercising will reduce the pain and swelling, increase motion, and prevent muscle weakness.   . Please continue to use the TED compression stockings for 6 weeks. You may remove the stockings at night, but should reapply them in the morning. . Do not drive or operate any equipment until instructed.  WOUND CARE:  . Continue to use ice packs periodically to reduce pain and swelling. . Keep the incision clean and dry. . You may bathe or shower after the staples are removed at the first office visit following surgery.  MEDICATIONS: . You may resume your regular medications. . Please take the pain medication as prescribed on the medication. . Do not take pain medication on an empty stomach. . You have been given a prescription for a blood thinner to prevent blood clots. Please take the medication as instructed. (NOTE: After completing a 2 week course of Lovenox, take one Enteric-coated aspirin once a day.) . Pain medications and iron supplements can cause constipation. Use a stool softener (Senokot or Colace) on a daily basis and a laxative (dulcolax or miralax) as needed. . Do not drive or drink alcoholic beverages when taking pain medications.  CALL THE OFFICE FOR: . Temperature above 101 degrees . Excessive bleeding or drainage on the dressing. . Excessive  swelling, coldness, or paleness of the toes. . Persistent nausea and vomiting.  FOLLOW-UP:  . You should have an appointment to return to the office in 6 weeks after surgery. . Arrangements have been made for continuation of Physical Therapy (either home therapy or outpatient therapy).     [facility] Department Directory         [email or web address]       [email or web address]          Cardiology  Appointments: [place] - [phone] [place] - [phone]  Endocrinology  Appointments: [place] - [phone] [place] - [phone]  Gastroenterology  Appointments: [place] - [phone] [place] - [phone]        General Surgery   Appointments: [place] - [phone]  Internal Medicine/Family Medicine  Appointments: [place] - [phone] [place] - [phone] [place] - [phone]  Metabolic and Weigh Loss Surgery  Appointments: [place] - [phone]        Neurology  Appointments: [place] - [phone] [place] - [phone]  Neurosurgery  Appointments: [place] - [phone]  Obstetrics & Gynecology  Appointments: [place] - [phone] [place] - [phone]        Pediatrics  Appointments: [place] - [phone] [place] - [phone]  Physiatry  Appointments: [place] -[phone]  Physical Therapy  Appointments: [place] - [phone] [place] - [phone]        Podiatry  Appointments: [place] - [phone] [place] - [phone]  Pulmonology  Appointments: [place] - [phone]  Rheumatology  Appointments: [place] - [phone]         Location: [facility]  [address] [place], [place]  [address]  [place] Location: [facility] [address] [place], [place]  [address]  [place] Location: [facility] [address] [place], [place]  [address]    

## 2020-06-02 ENCOUNTER — Encounter
Admission: RE | Admit: 2020-06-02 | Discharge: 2020-06-02 | Disposition: A | Discharge status: Home / Self Care | Payer: Medicare HMO | Source: Ambulatory Visit | Attending: Orthopedic Surgery | Admitting: Orthopedic Surgery

## 2020-06-02 ENCOUNTER — Other Ambulatory Visit: Payer: Self-pay

## 2020-06-02 DIAGNOSIS — Z01818 Encounter for other preprocedural examination: Secondary | ICD-10-CM | POA: Insufficient documentation

## 2020-06-02 HISTORY — DX: Gastro-esophageal reflux disease without esophagitis: K21.9

## 2020-06-02 NOTE — Patient Instructions (Signed)
Your procedure is scheduled on: Wed. 6/23 Report to Day Surgery. To find out your arrival time please call [phone] between 1PM - 3PM on Tues 6/22.  Remember: Instructions that are not followed completely may result in serious medical risk,  up to and including death, or upon the discretion of your surgeon and anesthesiologist your  surgery may need to be rescheduled.     _X__ 1. Do not eat food after midnight the night before your procedure.                 No gum chewing or hard candies. You may drink clear liquids up to 2 hours                 before you are scheduled to arrive for your surgery- DO not drink clear                 liquids within 2 hours of the start of your surgery.                 Clear Liquids include:  water, apple juice without pulp, clear Gatorade, G2 or                  Gatorade Zero (avoid Red/Purple/Blue), Black Coffee or Tea (Do not add                 anything to coffee or tea). ___x__2.   Complete the carbohydrate drink provided to you, 2 hours before arrival.  __X__2.  On the morning of surgery brush your teeth with toothpaste and water, you                may rinse your mouth with mouthwash if you wish.  Do not swallow any toothpaste of mouthwash.     ___ 3.  No Alcohol for 24 hours before or after surgery.   ___ 4.  Do Not Smoke or use e-cigarettes For 24 Hours Prior to Your Surgery.                 Do not use any chewable tobacco products for at least 6 hours prior to                 Surgery.  ___  5.  Do not use any recreational drugs (marijuana, cocaine, heroin, ecstacy, MDMA or other)                For at least one week prior to your surgery.  Combination of these drugs with anesthesia                May have life threatening results.  ____  6.  Bring all medications with you on the day of surgery if instructed.   __x__  7.  Notify your doctor if there is any change in your medical condition      (cold, fever,  infections).     Do not wear jewelry, make-up, hairpins, clips or nail polish. Do not wear lotions, powders, or perfumes. You may wear deodorant. Do not shave 48 hours prior to surgery.  Do not bring valuables to the hospital.    [facility] is not responsible for any belongings or valuables.  Contacts, dentures or bridgework may not be worn into surgery. Leave your suitcase in the car. After surgery it may be brought to your room. For patients admitted to the hospital, discharge time is determined by your treatment team.   Patients discharged  the day of surgery will not be allowed to drive home.   Make arrangements for someone to be with you for the first 24 hours of your Same Day Discharge.    Please read over the following fact sheets that you were given:  See incentive spirometer instructions.  You will be given the device the day of surgery for use after surgery     __x__ Take these medicines the morning of surgery with A SIP OF WATER:    1. famotidine (PEPCID) 40 MG tablet  Take the night before and the morning of surgery  2. acetaminophen (TYLENOL) 500 MG tablet if needed  3. fluticasone (FLONASE) 50 MCG/ACT nasal spray  4.metoprolol succinate (TOPROL-XL) 100 MG 24 hr tablet  5.tiZANidine (ZANAFLEX) 4 MG tablet if needed  6.  ____ Fleet Enema (as directed)   __x__ Use CHG Soap (or wipes) as directed  ____ Use Benzoyl Peroxide Gel as instructed  ____ Use inhalers on the day of surgery  ____ Stop metformin 2 days prior to surgery    ____ Take 1/2 of usual insulin dose the night before surgery. No insulin the morning          of surgery.   __x__ Stop aspirin on 6/16  __x__ Stop Anti-inflammatories Naproxen Sodium 220 MG CAPS and ibuprofen on 6/16  May take tylenol   __x__ Stop supplements until after surgery. Lysine 500 MG TABS    ____ Bring C-Pap to the hospital.

## 2020-06-04 ENCOUNTER — Other Ambulatory Visit: Payer: Self-pay

## 2020-06-04 ENCOUNTER — Encounter
Admission: RE | Admit: 2020-06-04 | Discharge: 2020-06-04 | Disposition: A | Discharge status: Home / Self Care | Payer: Medicare HMO | Source: Ambulatory Visit | Attending: Orthopedic Surgery | Admitting: Orthopedic Surgery

## 2020-06-04 DIAGNOSIS — Z01818 Encounter for other preprocedural examination: Secondary | ICD-10-CM | POA: Insufficient documentation

## 2020-06-04 DIAGNOSIS — Z01810 Encounter for preprocedural cardiovascular examination: Secondary | ICD-10-CM | POA: Diagnosis not present

## 2020-06-04 LAB — CBC WITH DIFFERENTIAL/PLATELET
Abs Immature Granulocytes: 0.02 10*3/uL (ref 0.00–0.07)
Basophils Absolute: 0 10*3/uL (ref 0.0–0.1)
Basophils Relative: 1 %
Eosinophils Absolute: 0.1 10*3/uL (ref 0.0–0.5)
Eosinophils Relative: 2 %
HCT: 35.1 % — ABNORMAL LOW (ref 36.0–46.0)
Hemoglobin: 12.2 g/dL (ref 12.0–15.0)
Immature Granulocytes: 0 %
Lymphocytes Relative: 15 %
Lymphs Abs: 0.9 10*3/uL (ref 0.7–4.0)
MCH: 30.8 pg (ref 26.0–34.0)
MCHC: 34.8 g/dL (ref 30.0–36.0)
MCV: 88.6 fL (ref 80.0–100.0)
Monocytes Absolute: 0.6 10*3/uL (ref 0.1–1.0)
Monocytes Relative: 11 %
Neutro Abs: 4.1 10*3/uL (ref 1.7–7.7)
Neutrophils Relative %: 71 %
Platelets: 228 10*3/uL (ref 150–400)
RBC: 3.96 MIL/uL (ref 3.87–5.11)
RDW: 12.7 % (ref 11.5–15.5)
WBC: 5.8 10*3/uL (ref 4.0–10.5)
nRBC: 0 % (ref 0.0–0.2)

## 2020-06-04 LAB — URINALYSIS, ROUTINE W REFLEX MICROSCOPIC
Bilirubin Urine: NEGATIVE
Glucose, UA: NEGATIVE mg/dL
Hgb urine dipstick: NEGATIVE
Ketones, ur: NEGATIVE mg/dL
Leukocytes,Ua: NEGATIVE
Nitrite: NEGATIVE
Protein, ur: NEGATIVE mg/dL
Specific Gravity, Urine: 1.012 (ref 1.005–1.030)
pH: 6 (ref 5.0–8.0)

## 2020-06-04 LAB — TYPE AND SCREEN
ABO/RH(D): A POS
Antibody Screen: NEGATIVE

## 2020-06-04 LAB — APTT: aPTT: 33 seconds (ref 24–36)

## 2020-06-04 LAB — PROTIME-INR
INR: 1 (ref 0.8–1.2)
Prothrombin Time: 12.4 seconds (ref 11.4–15.2)

## 2020-06-04 LAB — COMPREHENSIVE METABOLIC PANEL
ALT: 21 U/L (ref 0–44)
AST: 27 U/L (ref 15–41)
Albumin: 4 g/dL (ref 3.5–5.0)
Alkaline Phosphatase: 33 U/L — ABNORMAL LOW (ref 38–126)
Anion gap: 5 (ref 5–15)
BUN: 25 mg/dL — ABNORMAL HIGH (ref 8–23)
CO2: 29 mmol/L (ref 22–32)
Calcium: 9.2 mg/dL (ref 8.9–10.3)
Chloride: 98 mmol/L (ref 98–111)
Creatinine, Ser: 1.13 mg/dL — ABNORMAL HIGH (ref 0.44–1.00)
GFR calc Af Amer: 56 mL/min — ABNORMAL LOW (ref >60)
GFR calc non Af Amer: 49 mL/min — ABNORMAL LOW (ref >60)
Glucose, Bld: 93 mg/dL (ref 70–99)
Potassium: 3.6 mmol/L (ref 3.5–5.1)
Sodium: 132 mmol/L — ABNORMAL LOW (ref 135–145)
Total Bilirubin: 1 mg/dL (ref 0.3–1.2)
Total Protein: 6.6 g/dL (ref 6.5–8.1)

## 2020-06-04 LAB — C-REACTIVE PROTEIN: CRP: 0.5 mg/dL (ref <1.0)

## 2020-06-04 LAB — SEDIMENTATION RATE: Sed Rate: 18 mm/hr (ref 0–30)

## 2020-06-04 LAB — SURGICAL PCR SCREEN
MRSA, PCR: NEGATIVE
Staphylococcus aureus: NEGATIVE

## 2020-06-05 LAB — URINE CULTURE
Culture: NO GROWTH
Special Requests: NORMAL

## 2020-06-08 ENCOUNTER — Other Ambulatory Visit: Payer: Self-pay

## 2020-06-08 ENCOUNTER — Other Ambulatory Visit
Admission: RE | Admit: 2020-06-08 | Discharge: 2020-06-08 | Disposition: A | Discharge status: Home / Self Care | Payer: Medicare HMO | Source: Ambulatory Visit | Attending: Orthopedic Surgery | Admitting: Orthopedic Surgery

## 2020-06-08 DIAGNOSIS — Z20822 Contact with and (suspected) exposure to covid-19: Secondary | ICD-10-CM | POA: Diagnosis not present

## 2020-06-08 DIAGNOSIS — Z01812 Encounter for preprocedural laboratory examination: Secondary | ICD-10-CM | POA: Insufficient documentation

## 2020-06-08 DIAGNOSIS — M19012 Primary osteoarthritis, left shoulder: Secondary | ICD-10-CM | POA: Diagnosis not present

## 2020-06-08 DIAGNOSIS — M7582 Other shoulder lesions, left shoulder: Secondary | ICD-10-CM | POA: Diagnosis not present

## 2020-06-09 LAB — SARS CORONAVIRUS 2 (TAT 6-24 HRS): SARS Coronavirus 2: NEGATIVE

## 2020-06-10 ENCOUNTER — Other Ambulatory Visit: Payer: Self-pay

## 2020-06-10 ENCOUNTER — Observation Stay
Admission: RE | Admit: 2020-06-10 | Discharge: 2020-06-12 | Disposition: A | Discharge status: Home / Self Care | Payer: Medicare HMO | Attending: Orthopedic Surgery | Admitting: Orthopedic Surgery

## 2020-06-10 ENCOUNTER — Encounter: Payer: Self-pay | Admitting: Orthopedic Surgery

## 2020-06-10 ENCOUNTER — Ambulatory Visit: Payer: Medicare HMO | Admitting: Certified Registered Nurse Anesthetist

## 2020-06-10 ENCOUNTER — Ambulatory Visit: Payer: Self-pay

## 2020-06-10 ENCOUNTER — Observation Stay: Payer: Medicare HMO

## 2020-06-10 ENCOUNTER — Encounter
Admission: RE | Disposition: A | Discharge status: Home / Self Care | Payer: Self-pay | Source: Home / Self Care | Attending: Orthopedic Surgery

## 2020-06-10 DIAGNOSIS — Z96649 Presence of unspecified artificial hip joint: Secondary | ICD-10-CM

## 2020-06-10 DIAGNOSIS — Z471 Aftercare following joint replacement surgery: Secondary | ICD-10-CM | POA: Diagnosis not present

## 2020-06-10 DIAGNOSIS — E785 Hyperlipidemia, unspecified: Secondary | ICD-10-CM | POA: Diagnosis not present

## 2020-06-10 DIAGNOSIS — M25559 Pain in unspecified hip: Secondary | ICD-10-CM

## 2020-06-10 DIAGNOSIS — Z96651 Presence of right artificial knee joint: Secondary | ICD-10-CM | POA: Diagnosis not present

## 2020-06-10 DIAGNOSIS — Z885 Allergy status to narcotic agent status: Secondary | ICD-10-CM | POA: Insufficient documentation

## 2020-06-10 DIAGNOSIS — M1612 Unilateral primary osteoarthritis, left hip: Secondary | ICD-10-CM | POA: Diagnosis not present

## 2020-06-10 DIAGNOSIS — Z7982 Long term (current) use of aspirin: Secondary | ICD-10-CM | POA: Diagnosis not present

## 2020-06-10 DIAGNOSIS — Z8249 Family history of ischemic heart disease and other diseases of the circulatory system: Secondary | ICD-10-CM | POA: Diagnosis not present

## 2020-06-10 DIAGNOSIS — Z79899 Other long term (current) drug therapy: Secondary | ICD-10-CM | POA: Insufficient documentation

## 2020-06-10 DIAGNOSIS — I129 Hypertensive chronic kidney disease with stage 1 through stage 4 chronic kidney disease, or unspecified chronic kidney disease: Secondary | ICD-10-CM | POA: Diagnosis not present

## 2020-06-10 DIAGNOSIS — Z96642 Presence of left artificial hip joint: Secondary | ICD-10-CM

## 2020-06-10 DIAGNOSIS — M8580 Other specified disorders of bone density and structure, unspecified site: Secondary | ICD-10-CM | POA: Insufficient documentation

## 2020-06-10 DIAGNOSIS — K219 Gastro-esophageal reflux disease without esophagitis: Secondary | ICD-10-CM | POA: Diagnosis not present

## 2020-06-10 DIAGNOSIS — N182 Chronic kidney disease, stage 2 (mild): Secondary | ICD-10-CM | POA: Diagnosis not present

## 2020-06-10 DIAGNOSIS — Z7689 Persons encountering health services in other specified circumstances: Secondary | ICD-10-CM | POA: Diagnosis not present

## 2020-06-10 HISTORY — PX: TOTAL HIP ARTHROPLASTY: SHX124

## 2020-06-10 SURGERY — ARTHROPLASTY, HIP, TOTAL,POSTERIOR APPROACH
Anesthesia: Spinal | Site: Hip | Laterality: Left

## 2020-06-10 MED ORDER — ACETAMINOPHEN 10 MG/ML IV SOLN
1000.0000 mg | Freq: Four times a day (QID) | INTRAVENOUS | Status: AC
  Administered 2020-06-11 (×3): 1000 mg via INTRAVENOUS
  Filled 2020-06-10 (×3): qty 100

## 2020-06-10 MED ORDER — NEOMYCIN-POLYMYXIN B GU 40-200000 IR SOLN
Status: DC | PRN
  Administered 2020-06-10: 12 mL

## 2020-06-10 MED ORDER — ONDANSETRON HCL 4 MG/2ML IJ SOLN: 4.0000 mg | Freq: Four times a day (QID) | INTRAMUSCULAR | Status: DC | PRN

## 2020-06-10 MED ORDER — CHLORHEXIDINE GLUCONATE 0.12 % MT SOLN
OROMUCOSAL | Status: AC
  Administered 2020-06-10: 15 mL via OROMUCOSAL
  Filled 2020-06-10: qty 15

## 2020-06-10 MED ORDER — HYDROMORPHONE HCL 1 MG/ML IJ SOLN
0.5000 mg | INTRAMUSCULAR | Status: DC | PRN
  Administered 2020-06-10: 1 mg via INTRAVENOUS
  Filled 2020-06-10: qty 1

## 2020-06-10 MED ORDER — LOSARTAN POTASSIUM 50 MG PO TABS
100.0000 mg | ORAL_TABLET | Freq: Every day | ORAL | Status: DC
  Administered 2020-06-10 – 2020-06-12 (×3): 100 mg via ORAL
  Filled 2020-06-10 (×3): qty 2

## 2020-06-10 MED ORDER — CELECOXIB 200 MG PO CAPS
ORAL_CAPSULE | ORAL | Status: AC
  Administered 2020-06-10: 400 mg via ORAL
  Filled 2020-06-10: qty 2

## 2020-06-10 MED ORDER — ORAL CARE MOUTH RINSE: 15.0000 mL | Freq: Once | OROMUCOSAL | Status: AC

## 2020-06-10 MED ORDER — ONDANSETRON HCL 4 MG/2ML IJ SOLN
INTRAMUSCULAR | Status: DC | PRN
  Administered 2020-06-10: 4 mg via INTRAVENOUS

## 2020-06-10 MED ORDER — BUPIVACAINE HCL (PF) 0.5 % IJ SOLN
INTRAMUSCULAR | Status: DC | PRN
  Administered 2020-06-10: 3 mL

## 2020-06-10 MED ORDER — METOCLOPRAMIDE HCL 10 MG PO TABS
10.0000 mg | ORAL_TABLET | Freq: Three times a day (TID) | ORAL | Status: DC
  Administered 2020-06-10 – 2020-06-11 (×4): 10 mg via ORAL
  Filled 2020-06-10 (×7): qty 1

## 2020-06-10 MED ORDER — GABAPENTIN 300 MG PO CAPS: 300.0000 mg | ORAL_CAPSULE | Freq: Once | ORAL | Status: AC

## 2020-06-10 MED ORDER — DEXAMETHASONE SODIUM PHOSPHATE 10 MG/ML IJ SOLN: 8.0000 mg | Freq: Once | INTRAMUSCULAR | Status: DC

## 2020-06-10 MED ORDER — OXYCODONE HCL 5 MG PO TABS
5.0000 mg | ORAL_TABLET | ORAL | Status: DC | PRN
  Administered 2020-06-11 – 2020-06-12 (×4): 5 mg via ORAL
  Filled 2020-06-10 (×5): qty 1

## 2020-06-10 MED ORDER — SODIUM CHLORIDE 0.9 % IV SOLN: INTRAVENOUS | Status: DC

## 2020-06-10 MED ORDER — ONDANSETRON HCL 4 MG/2ML IJ SOLN
INTRAMUSCULAR | Status: AC
  Filled 2020-06-10: qty 2

## 2020-06-10 MED ORDER — ALUM & MAG HYDROXIDE-SIMETH 200-200-20 MG/5ML PO SUSP: 30.0000 mL | ORAL | Status: DC | PRN

## 2020-06-10 MED ORDER — METOCLOPRAMIDE HCL 10 MG PO TABS
ORAL_TABLET | ORAL | Status: AC
  Administered 2020-06-10: 10 mg via ORAL
  Filled 2020-06-10: qty 1

## 2020-06-10 MED ORDER — OXYCODONE HCL 5 MG PO TABS
10.0000 mg | ORAL_TABLET | ORAL | Status: DC | PRN
  Administered 2020-06-10: 10 mg via ORAL

## 2020-06-10 MED ORDER — MAGNESIUM OXIDE 400 (241.3 MG) MG PO TABS
400.0000 mg | ORAL_TABLET | Freq: Every day | ORAL | Status: DC
  Administered 2020-06-11: 400 mg via ORAL
  Filled 2020-06-10 (×2): qty 1

## 2020-06-10 MED ORDER — ACETAMINOPHEN 10 MG/ML IV SOLN
INTRAVENOUS | Status: AC
  Filled 2020-06-10: qty 100

## 2020-06-10 MED ORDER — BISACODYL 10 MG RE SUPP
10.0000 mg | Freq: Every day | RECTAL | Status: DC | PRN
  Filled 2020-06-10: qty 1

## 2020-06-10 MED ORDER — FERROUS SULFATE 325 (65 FE) MG PO TABS
325.0000 mg | ORAL_TABLET | Freq: Two times a day (BID) | ORAL | Status: DC
  Administered 2020-06-10 – 2020-06-11 (×4): 325 mg via ORAL
  Filled 2020-06-10 (×6): qty 1

## 2020-06-10 MED ORDER — MIDAZOLAM HCL 2 MG/2ML IJ SOLN
INTRAMUSCULAR | Status: AC
  Filled 2020-06-10: qty 2

## 2020-06-10 MED ORDER — OXYCODONE HCL 5 MG PO TABS
ORAL_TABLET | ORAL | Status: AC
  Filled 2020-06-10: qty 2

## 2020-06-10 MED ORDER — FENTANYL CITRATE (PF) 100 MCG/2ML IJ SOLN
INTRAMUSCULAR | Status: DC | PRN
  Administered 2020-06-10 (×4): 25 ug via INTRAVENOUS

## 2020-06-10 MED ORDER — ONDANSETRON HCL 4 MG/2ML IJ SOLN: 4.0000 mg | Freq: Once | INTRAMUSCULAR | Status: DC | PRN

## 2020-06-10 MED ORDER — PHENYLEPHRINE HCL (PRESSORS) 10 MG/ML IV SOLN
INTRAVENOUS | Status: AC
  Filled 2020-06-10: qty 1

## 2020-06-10 MED ORDER — ACETAMINOPHEN 325 MG PO TABS
325.0000 mg | ORAL_TABLET | Freq: Four times a day (QID) | ORAL | Status: DC | PRN
  Administered 2020-06-11: 650 mg via ORAL
  Filled 2020-06-10 (×2): qty 2

## 2020-06-10 MED ORDER — TRANEXAMIC ACID-NACL 1000-0.7 MG/100ML-% IV SOLN: 1000.0000 mg | Freq: Once | INTRAVENOUS | Status: AC

## 2020-06-10 MED ORDER — TRANEXAMIC ACID-NACL 1000-0.7 MG/100ML-% IV SOLN
1000.0000 mg | INTRAVENOUS | Status: AC
  Administered 2020-06-10: 1000 mg via INTRAVENOUS

## 2020-06-10 MED ORDER — CELECOXIB 200 MG PO CAPS: 400.0000 mg | ORAL_CAPSULE | Freq: Once | ORAL | Status: AC

## 2020-06-10 MED ORDER — HYDROCHLOROTHIAZIDE 12.5 MG PO CAPS
12.5000 mg | ORAL_CAPSULE | Freq: Every day | ORAL | Status: DC
  Administered 2020-06-11 – 2020-06-12 (×2): 12.5 mg via ORAL
  Filled 2020-06-10 (×3): qty 1

## 2020-06-10 MED ORDER — METOCLOPRAMIDE HCL 10 MG PO TABS: 5.0000 mg | ORAL_TABLET | Freq: Three times a day (TID) | ORAL | Status: DC | PRN

## 2020-06-10 MED ORDER — METOCLOPRAMIDE HCL 5 MG/ML IJ SOLN: 5.0000 mg | Freq: Three times a day (TID) | INTRAMUSCULAR | Status: DC | PRN

## 2020-06-10 MED ORDER — CEFAZOLIN SODIUM-DEXTROSE 2-4 GM/100ML-% IV SOLN
2.0000 g | Freq: Four times a day (QID) | INTRAVENOUS | Status: AC
  Administered 2020-06-10 – 2020-06-11 (×3): 2 g via INTRAVENOUS
  Filled 2020-06-10 (×3): qty 100

## 2020-06-10 MED ORDER — CHLORHEXIDINE GLUCONATE 0.12 % MT SOLN: 15.0000 mL | Freq: Once | OROMUCOSAL | Status: AC

## 2020-06-10 MED ORDER — GABAPENTIN 300 MG PO CAPS
300.0000 mg | ORAL_CAPSULE | Freq: Every day | ORAL | Status: DC
  Administered 2020-06-10 – 2020-06-11 (×2): 300 mg via ORAL
  Filled 2020-06-10 (×2): qty 1

## 2020-06-10 MED ORDER — PROPOFOL 10 MG/ML IV BOLUS
INTRAVENOUS | Status: DC | PRN
  Administered 2020-06-10: 30 mg via INTRAVENOUS

## 2020-06-10 MED ORDER — GABAPENTIN 300 MG PO CAPS
ORAL_CAPSULE | ORAL | Status: AC
  Administered 2020-06-10: 300 mg via ORAL
  Filled 2020-06-10: qty 1

## 2020-06-10 MED ORDER — VITAMIN D3 25 MCG (1000 UNIT) PO TABS
1000.0000 [IU] | ORAL_TABLET | Freq: Every day | ORAL | Status: DC
  Administered 2020-06-11 – 2020-06-12 (×2): 1000 [IU] via ORAL
  Filled 2020-06-10 (×4): qty 1

## 2020-06-10 MED ORDER — METOPROLOL SUCCINATE ER 50 MG PO TB24
100.0000 mg | ORAL_TABLET | Freq: Every day | ORAL | Status: DC
  Administered 2020-06-11 – 2020-06-12 (×2): 100 mg via ORAL
  Filled 2020-06-10 (×2): qty 2

## 2020-06-10 MED ORDER — PROPOFOL 500 MG/50ML IV EMUL
INTRAVENOUS | Status: DC | PRN
  Administered 2020-06-10: 100 ug/kg/min via INTRAVENOUS

## 2020-06-10 MED ORDER — MIDAZOLAM HCL 5 MG/5ML IJ SOLN
INTRAMUSCULAR | Status: DC | PRN
  Administered 2020-06-10: 2 mg via INTRAVENOUS

## 2020-06-10 MED ORDER — PHENOL 1.4 % MT LIQD
1.0000 | OROMUCOSAL | Status: DC | PRN
  Filled 2020-06-10: qty 177

## 2020-06-10 MED ORDER — PROPOFOL 500 MG/50ML IV EMUL
INTRAVENOUS | Status: AC
  Filled 2020-06-10: qty 100

## 2020-06-10 MED ORDER — MENTHOL 3 MG MT LOZG
1.0000 | LOZENGE | OROMUCOSAL | Status: DC | PRN
  Filled 2020-06-10: qty 9

## 2020-06-10 MED ORDER — FENTANYL CITRATE (PF) 100 MCG/2ML IJ SOLN
INTRAMUSCULAR | Status: AC
  Filled 2020-06-10: qty 2

## 2020-06-10 MED ORDER — EPHEDRINE 5 MG/ML INJ
INTRAVENOUS | Status: AC
  Filled 2020-06-10: qty 10

## 2020-06-10 MED ORDER — PANTOPRAZOLE SODIUM 40 MG PO TBEC
40.0000 mg | DELAYED_RELEASE_TABLET | Freq: Two times a day (BID) | ORAL | Status: DC
  Administered 2020-06-10 – 2020-06-12 (×4): 40 mg via ORAL
  Filled 2020-06-10 (×5): qty 1

## 2020-06-10 MED ORDER — BUPIVACAINE HCL (PF) 0.5 % IJ SOLN
INTRAMUSCULAR | Status: AC
  Filled 2020-06-10: qty 10

## 2020-06-10 MED ORDER — FENTANYL CITRATE (PF) 100 MCG/2ML IJ SOLN
INTRAMUSCULAR | Status: AC
  Administered 2020-06-10: 25 ug via INTRAVENOUS
  Filled 2020-06-10: qty 2

## 2020-06-10 MED ORDER — ACETAMINOPHEN 10 MG/ML IV SOLN
INTRAVENOUS | Status: DC | PRN
  Administered 2020-06-10: 1000 mg via INTRAVENOUS

## 2020-06-10 MED ORDER — CEFAZOLIN SODIUM-DEXTROSE 2-4 GM/100ML-% IV SOLN
INTRAVENOUS | Status: AC
  Filled 2020-06-10: qty 100

## 2020-06-10 MED ORDER — ACETAMINOPHEN 10 MG/ML IV SOLN
INTRAVENOUS | Status: AC
  Administered 2020-06-10: 1000 mg via INTRAVENOUS
  Filled 2020-06-10: qty 100

## 2020-06-10 MED ORDER — FLUTICASONE PROPIONATE 50 MCG/ACT NA SUSP
1.0000 | Freq: Every day | NASAL | Status: DC | PRN
  Filled 2020-06-10: qty 16

## 2020-06-10 MED ORDER — ENSURE PRE-SURGERY PO LIQD
296.0000 mL | Freq: Once | ORAL | Status: DC
  Filled 2020-06-10: qty 296

## 2020-06-10 MED ORDER — TRAMADOL HCL 50 MG PO TABS
50.0000 mg | ORAL_TABLET | ORAL | Status: DC | PRN
  Administered 2020-06-10: 50 mg via ORAL

## 2020-06-10 MED ORDER — CEFAZOLIN SODIUM-DEXTROSE 2-4 GM/100ML-% IV SOLN
INTRAVENOUS | Status: AC
  Administered 2020-06-10: 2 g via INTRAVENOUS
  Filled 2020-06-10: qty 100

## 2020-06-10 MED ORDER — CHLORHEXIDINE GLUCONATE 4 % EX LIQD: 60.0000 mL | Freq: Once | CUTANEOUS | Status: DC

## 2020-06-10 MED ORDER — MAGNESIUM HYDROXIDE 400 MG/5ML PO SUSP
30.0000 mL | Freq: Every day | ORAL | Status: DC
  Administered 2020-06-11: 30 mL via ORAL
  Filled 2020-06-10 (×2): qty 30

## 2020-06-10 MED ORDER — ENOXAPARIN SODIUM 40 MG/0.4ML ~~LOC~~ SOLN
40.0000 mg | SUBCUTANEOUS | Status: DC
  Administered 2020-06-11 – 2020-06-12 (×2): 40 mg via SUBCUTANEOUS
  Filled 2020-06-10 (×2): qty 0.4

## 2020-06-10 MED ORDER — ONDANSETRON HCL 4 MG PO TABS: 4.0000 mg | ORAL_TABLET | Freq: Four times a day (QID) | ORAL | Status: DC | PRN

## 2020-06-10 MED ORDER — ADULT MULTIVITAMIN W/MINERALS CH
1.0000 | ORAL_TABLET | Freq: Every day | ORAL | Status: DC
  Administered 2020-06-11 – 2020-06-12 (×2): 1 via ORAL
  Filled 2020-06-10 (×2): qty 1

## 2020-06-10 MED ORDER — CELECOXIB 200 MG PO CAPS
200.0000 mg | ORAL_CAPSULE | Freq: Two times a day (BID) | ORAL | Status: DC
  Administered 2020-06-10 – 2020-06-12 (×4): 200 mg via ORAL
  Filled 2020-06-10 (×4): qty 1

## 2020-06-10 MED ORDER — FLEET ENEMA 7-19 GM/118ML RE ENEM: 1.0000 | ENEMA | Freq: Once | RECTAL | Status: DC | PRN

## 2020-06-10 MED ORDER — EPHEDRINE SULFATE 50 MG/ML IJ SOLN
INTRAMUSCULAR | Status: DC | PRN
  Administered 2020-06-10 (×2): 5 mg via INTRAVENOUS

## 2020-06-10 MED ORDER — FENTANYL CITRATE (PF) 100 MCG/2ML IJ SOLN
25.0000 ug | INTRAMUSCULAR | Status: DC | PRN
  Administered 2020-06-10: 25 ug via INTRAVENOUS

## 2020-06-10 MED ORDER — SIMVASTATIN 20 MG PO TABS
10.0000 mg | ORAL_TABLET | Freq: Every day | ORAL | Status: DC
  Administered 2020-06-10 – 2020-06-12 (×3): 10 mg via ORAL
  Filled 2020-06-10 (×3): qty 1

## 2020-06-10 MED ORDER — LACTATED RINGERS IV SOLN: INTRAVENOUS | Status: DC

## 2020-06-10 MED ORDER — CEFAZOLIN SODIUM-DEXTROSE 2-4 GM/100ML-% IV SOLN
2.0000 g | INTRAVENOUS | Status: AC
  Administered 2020-06-10: 2 g via INTRAVENOUS

## 2020-06-10 MED ORDER — SENNOSIDES-DOCUSATE SODIUM 8.6-50 MG PO TABS
1.0000 | ORAL_TABLET | Freq: Two times a day (BID) | ORAL | Status: DC
  Administered 2020-06-10 – 2020-06-11 (×3): 1 via ORAL
  Filled 2020-06-10 (×5): qty 1

## 2020-06-10 MED ORDER — DIPHENHYDRAMINE HCL 12.5 MG/5ML PO ELIX
12.5000 mg | ORAL_SOLUTION | ORAL | Status: DC | PRN
  Filled 2020-06-10: qty 10

## 2020-06-10 MED ORDER — TRAMADOL HCL 50 MG PO TABS
ORAL_TABLET | ORAL | Status: AC
  Filled 2020-06-10: qty 1

## 2020-06-10 MED ORDER — TRANEXAMIC ACID-NACL 1000-0.7 MG/100ML-% IV SOLN
INTRAVENOUS | Status: AC
  Filled 2020-06-10: qty 100

## 2020-06-10 MED ORDER — TIZANIDINE HCL 4 MG PO TABS
4.0000 mg | ORAL_TABLET | Freq: Three times a day (TID) | ORAL | Status: DC | PRN
  Filled 2020-06-10: qty 1

## 2020-06-10 MED ORDER — TRANEXAMIC ACID-NACL 1000-0.7 MG/100ML-% IV SOLN
INTRAVENOUS | Status: AC
  Administered 2020-06-10: 1000 mg via INTRAVENOUS
  Filled 2020-06-10: qty 100

## 2020-06-10 SURGICAL SUPPLY — 61 items
BLADE CLIPPER SURG (BLADE) ×1 IMPLANT
BLADE DRUM FLTD (BLADE) ×2 IMPLANT
BLADE SAW 90X25X1.19 OSCILLAT (BLADE) ×2 IMPLANT
CANISTER SUCT 1200ML W/VALVE (MISCELLANEOUS) ×2 IMPLANT
CANISTER SUCT 3000ML PPV (MISCELLANEOUS) ×4 IMPLANT
CARTRIDGE OIL MAESTRO DRILL (MISCELLANEOUS) ×1 IMPLANT
COVER WAND RF STERILE (DRAPES) ×2 IMPLANT
CUP ACETBLR 52 OD 100 SERIES (Hips) ×1 IMPLANT
DIFFUSER DRILL AIR PNEUMATIC (MISCELLANEOUS) ×2 IMPLANT
DRAPE 3/4 80X56 (DRAPES) ×2 IMPLANT
DRAPE INCISE IOBAN 66X60 STRL (DRAPES) ×2 IMPLANT
DRSG DERMACEA 8X12 NADH (GAUZE/BANDAGES/DRESSINGS) ×2 IMPLANT
DRSG OPSITE POSTOP 4X12 (GAUZE/BANDAGES/DRESSINGS) ×2 IMPLANT
DRSG OPSITE POSTOP 4X14 (GAUZE/BANDAGES/DRESSINGS) IMPLANT
DRSG TEGADERM 4X4.75 (GAUZE/BANDAGES/DRESSINGS) ×2 IMPLANT
DURAPREP 26ML APPLICATOR (WOUND CARE) ×2 IMPLANT
ELECT REM PT RETURN 9FT ADLT (ELECTROSURGICAL) ×2
ELECTRODE REM PT RTRN 9FT ADLT (ELECTROSURGICAL) ×1 IMPLANT
GLOVE BIO SURGEON STRL SZ7.5 (GLOVE) ×4 IMPLANT
GLOVE BIOGEL M STRL SZ7.5 (GLOVE) ×4 IMPLANT
GLOVE BIOGEL PI IND STRL 7.5 (GLOVE) ×1 IMPLANT
GLOVE BIOGEL PI INDICATOR 7.5 (GLOVE) ×1
GLOVE INDICATOR 8.0 STRL GRN (GLOVE) ×2 IMPLANT
GOWN STRL REUS W/ TWL LRG LVL3 (GOWN DISPOSABLE) ×2 IMPLANT
GOWN STRL REUS W/ TWL XL LVL3 (GOWN DISPOSABLE) ×1 IMPLANT
GOWN STRL REUS W/TWL LRG LVL3 (GOWN DISPOSABLE) ×2
GOWN STRL REUS W/TWL XL LVL3 (GOWN DISPOSABLE) ×1
HEAD M SROM 36MM PLUS 1.5 (Hips) IMPLANT
HEMOVAC 400CC 10FR (MISCELLANEOUS) ×2 IMPLANT
HOLDER FOLEY CATH W/STRAP (MISCELLANEOUS) ×2 IMPLANT
HOOD PEEL AWAY FLYTE STAYCOOL (MISCELLANEOUS) ×4 IMPLANT
KIT TURNOVER KIT A (KITS) ×2 IMPLANT
LINER MARATHON 4MM 10DEG 36X52 (Hips) ×1 IMPLANT
MANIFOLD NEPTUNE II (INSTRUMENTS) ×2 IMPLANT
NDL SAFETY ECLIPSE 18X1.5 (NEEDLE) ×1 IMPLANT
NEEDLE HYPO 18GX1.5 SHARP (NEEDLE) ×1
NS IRRIG 500ML POUR BTL (IV SOLUTION) ×2 IMPLANT
OIL CARTRIDGE MAESTRO DRILL (MISCELLANEOUS) ×2
PACK HIP PROSTHESIS (MISCELLANEOUS) ×2 IMPLANT
PENCIL SMOKE ULTRAEVAC 22 CON (MISCELLANEOUS) ×2 IMPLANT
PULSAVAC PLUS IRRIG FAN TIP (DISPOSABLE) ×2
SOL .9 NS 3000ML IRR  AL (IV SOLUTION) ×1
SOL .9 NS 3000ML IRR UROMATIC (IV SOLUTION) ×1 IMPLANT
SOL PREP PVP 2OZ (MISCELLANEOUS) ×2
SOLUTION PREP PVP 2OZ (MISCELLANEOUS) ×1 IMPLANT
SPONGE DRAIN TRACH 4X4 STRL 2S (GAUZE/BANDAGES/DRESSINGS) ×2 IMPLANT
SROM M HEAD 36MM PLUS 1.5 (Hips) ×2 IMPLANT
STAPLER SKIN PROX 35W (STAPLE) ×2 IMPLANT
STEM FEM CMNTLSS SM AML 15.0 (Hips) ×1 IMPLANT
SUT ETHIBOND #5 BRAIDED 30INL (SUTURE) ×2 IMPLANT
SUT VIC AB 0 CT1 36 (SUTURE) ×2 IMPLANT
SUT VIC AB 1 CT1 36 (SUTURE) ×4 IMPLANT
SUT VIC AB 2-0 CT1 27 (SUTURE) ×1
SUT VIC AB 2-0 CT1 TAPERPNT 27 (SUTURE) ×1 IMPLANT
SYR 20ML LL LF (SYRINGE) ×2 IMPLANT
TAPE CLOTH 3X10 WHT NS LF (GAUZE/BANDAGES/DRESSINGS) ×2 IMPLANT
TAPE TRANSPORE STRL 2 31045 (GAUZE/BANDAGES/DRESSINGS) ×2 IMPLANT
TIP FAN IRRIG PULSAVAC PLUS (DISPOSABLE) ×1 IMPLANT
TOWEL OR 17X26 4PK STRL BLUE (TOWEL DISPOSABLE) ×2 IMPLANT
TRAY FOLEY MTR SLVR 16FR STAT (SET/KITS/TRAYS/PACK) ×2 IMPLANT
TUBING CONNECTING 10 (TUBING) ×1 IMPLANT

## 2020-06-10 NOTE — H&P (Signed)
The patient has been re-examined, and the chart reviewed, and there have been no interval changes to the documented history and physical.    The risks, benefits, and alternatives have been discussed at length. The patient expressed understanding of the risks benefits and agreed with plans for surgical intervention.  Rachel James, Jr. M.D.    

## 2020-06-10 NOTE — Transfer of Care (Signed)
Immediate Anesthesia Transfer of Care Note  Patient: Rachel James  Procedure(s) Performed: TOTAL HIP ARTHROPLASTY (Left Hip)  Patient Location: PACU  Anesthesia Type:Spinal  Level of Consciousness: drowsy  Airway & Oxygen Therapy: Patient Spontanous Breathing and Patient connected to face mask oxygen  Post-op Assessment: Report given to RN and Post -op Vital signs reviewed and stable  Post vital signs: Reviewed and stable  Last Vitals:  Vitals Value Taken Time  BP 133/72 06/10/20 1056  Temp    Pulse 59 06/10/20 1058  Resp 15 06/10/20 1058  SpO2 100 % 06/10/20 1058  Vitals shown include unvalidated device data.  Last Pain:  Vitals:   06/10/20 1053  TempSrc:   PainSc: Asleep         Complications: No complications documented.

## 2020-06-10 NOTE — Anesthesia Preprocedure Evaluation (Signed)
Anesthesia Evaluation  Patient identified by MRN, date of birth, ID band Patient awake    Reviewed: reviewed documented beta blocker date and time   History of Anesthesia Complications (+) PONV and history of anesthetic complications (no problems with PONV in last several operations)  Airway Mallampati: II       Dental   Pulmonary neg sleep apnea, neg COPD, Not current smoker,           Cardiovascular hypertension, Pt. on medications and Pt. on home beta blockers (-) Past MI and (-) CHF (-) dysrhythmias (-) Valvular Problems/Murmurs     Neuro/Psych neg Seizures    GI/Hepatic Neg liver ROS, GERD  Medicated and Controlled,  Endo/Other  neg diabetes  Renal/GU Renal InsufficiencyRenal disease     Musculoskeletal   Abdominal   Peds  Hematology   Anesthesia Other Findings   Reproductive/Obstetrics                             Anesthesia Physical Anesthesia Plan  ASA: II  Anesthesia Plan: Spinal   Post-op Pain Management:    Induction:   PONV Risk Score and Plan:   Airway Management Planned:   Additional Equipment:   Intra-op Plan:   Post-operative Plan:   Informed Consent: I have reviewed the patients History and Physical, chart, labs and discussed the procedure including the risks, benefits and alternatives for the proposed anesthesia with the patient or authorized representative who has indicated his/her understanding and acceptance.       Plan Discussed with:   Anesthesia Plan Comments:         Anesthesia Quick Evaluation

## 2020-06-10 NOTE — Op Note (Signed)
OPERATIVE NOTE  DATE OF SURGERY:  06/10/2020  PATIENT NAME:  Rachel James   DOB: 12/08/1946  MRN: [id number]  PRE-OPERATIVE DIAGNOSIS: Degenerative arthrosis of the left hip, primary  POST-OPERATIVE DIAGNOSIS:  Same  PROCEDURE:  Left total hip arthroplasty  SURGEON:  Marciano Sequin. M.D.  ASSISTANT: Cassell Smiles, PA-C (present and scrubbed throughout the case, critical for assistance with exposure, retraction, instrumentation, and closure)  ANESTHESIA: spinal  ESTIMATED BLOOD LOSS: 50 mL  FLUIDS REPLACED: 1000 mL of crystalloid  DRAINS: 2 medium drains to a Hemovac reservoir  IMPLANTS UTILIZED: DePuy 15 mm small stature AML femoral stem, 52 mm OD Pinnacle 100 acetabular component, +4 mm 10 degree Pinnacle Marathon polyethylene insert, and a 36 mm M-SPEC +1.5 mm hip ball  INDICATIONS FOR SURGERY: Rachel James is a 74 y.o. year old female with a long history of progressive hip and groin  pain. X-rays demonstrated severe degenerative changes. The patient had not seen any significant improvement despite conservative nonsurgical intervention. After discussion of the risks and benefits of surgical intervention, the patient expressed understanding of the risks benefits and agree with plans for total hip arthroplasty.   The risks, benefits, and alternatives were discussed at length including but not limited to the risks of infection, bleeding, nerve injury, stiffness, blood clots, the need for revision surgery, limb length inequality, dislocation, cardiopulmonary complications, among others, and they were willing to proceed.  PROCEDURE IN DETAIL: The patient was brought into the operating room and, after adequate spinal anesthesia was achieved, the patient was placed in a right lateral decubitus position. Axillary roll was placed and all bony prominences were well-padded. The patient's left hip was cleaned and prepped with alcohol and DuraPrep and draped in the usual sterile fashion. A  "timeout" was performed as per usual protocol. A lateral curvilinear incision was made gently curving towards the posterior superior iliac spine. The IT band was incised in line with the skin incision and the fibers of the gluteus maximus were split in line. The piriformis tendon was identified, skeletonized, and incised at its insertion to the proximal femur and reflected posteriorly. A T type posterior capsulotomy was performed. Prior to dislocation of the femoral head, a threaded Steinmann pin was inserted through a separate stab incision into the pelvis superior to the acetabulum and bent in the form of a stylus so as to assess limb length and hip offset throughout the procedure. The femoral head was then dislocated posteriorly. Inspection of the femoral head demonstrated severe degenerative changes with full-thickness loss of articular cartilage. The femoral neck cut was performed using an oscillating saw. The anterior capsule was elevated off of the femoral neck using a periosteal elevator. Attention was then directed to the acetabulum. The remnant of the labrum was excised using electrocautery. Inspection of the acetabulum also demonstrated significant degenerative changes. The acetabulum was reamed in sequential fashion up to a 51 mm diameter. Good punctate bleeding bone was encountered. A 52 mm Pinnacle 100 acetabular component was positioned and impacted into place. Good scratch fit was appreciated. A neutral polyethylene trial was inserted.  Attention was then directed to the proximal femur. A hole for reaming of the proximal femoral canal was created using a high-speed burr. The femoral canal was reamed in sequential fashion up to a 14.5 mm diameter. This allowed for approximately 6 cm of scratch fit. Serial broaches were inserted up to a 15 mm small stature femoral broach. Calcar region was planed and a trial reduction  was performed using a 36 mm hip ball with a +1.5 mm neck length.  Reasonably good  stability was noted but it was elected to trial with a +4 mm 10 degree trial with the high side positioned at the 4 o'clock position.  Good equalization of limb lengths and hip offset was appreciated and excellent stability was noted both anteriorly and posteriorly. Trial components were removed. The acetabular shell was irrigated with copious amounts of normal saline with antibiotic solution and suctioned dry. A +4 mm 10 degree Pinnacle Marathon polyethylene insert was positioned with the high side at the 4 o'clock position and impacted into place. Next, a 15 mm small stature AML femoral stem was positioned and impacted into place. Excellent scratch fit was appreciated. A trial reduction was again performed with a 36 mm hip ball with a +1.5 mm neck length. Again, good equalization of limb lengths was appreciated and excellent stability appreciated both anteriorly and posteriorly. The hip was then dislocated and the trial hip ball was removed. The Morse taper was cleaned and dried. A 36 mm M-SPEC hip ball with a +1.5 mm neck length was placed on the trunnion and impacted into place. The hip was then reduced and placed through range of motion. Excellent stability was appreciated both anteriorly and posteriorly.  The wound was irrigated with copious amounts of normal saline with antibiotic solution and suctioned dry. Good hemostasis was appreciated. The posterior capsulotomy was repaired using #5 Ethibond. Piriformis tendon was reapproximated to the undersurface of the gluteus medius tendon using #5 Ethibond. Two medium drains were placed in the wound bed and brought out through separate stab incisions to be attached to a Hemovac reservoir. The IT band was reapproximated using interrupted sutures of #1 Vicryl. Subcutaneous tissue was approximated using first #0 Vicryl followed by #2-0 Vicryl. The skin was closed with skin staples.  The patient tolerated the procedure well and was transported to the recovery room  in stable condition.   Marciano Sequin., M.D.

## 2020-06-10 NOTE — Evaluation (Signed)
Physical Therapy Evaluation Patient Details Name: Rachel James MRN: [id number] DOB: 1946/01/27 Today's Date: 06/10/2020   History of Present Illness  Pt is a 74 yo female s/p L THA, WBAT, posterior approach. PMH of HTN, current smoker, renal disease.  Clinical Impression  Pt A&Ox4, reported L hip pain 2/10. Stated at baseline she is independent with ADLs/IADLs, lives alone but plans on her niece coming to stay with her at discharge, recent knee replacement in November of last year.  The patient demonstrated bed mobility with supervision and HOB elevated. Sit <> stand from EOB and from [facility] CGA with RW, cues for hand placement. The patient was able to take several steps from EOB to [facility] to recliner with CGA as well, but endorsed light headedness. BP reading 110/60 after pt reclined, further mobility deferred.  Overall the patient demonstrated deficits (see "PT Problem List") that impede the patient's functional abilities, safety, and mobility and would benefit from skilled PT intervention. Recommendation is HHPT with intermittent supervision.      Follow Up Recommendations Home health PT;Supervision - Intermittent    Equipment Recommendations  None recommended by PT (pt has RW and BSC)    Recommendations for Other Services OT consult     Precautions / Restrictions Precautions Precautions: Fall;Posterior Hip Precaution Booklet Issued: No Restrictions Weight Bearing Restrictions: Yes RLE Weight Bearing: Weight bearing as tolerated      Mobility  Bed Mobility Overal bed mobility: Needs Assistance Bed Mobility: Supine to Sit     Supine to sit: HOB elevated;Supervision        Transfers Overall transfer level: Needs assistance Equipment used: Rolling walker (2 wheeled) Transfers: Sit to/from Stand Sit to Stand: Min guard            Ambulation/Gait Ambulation/Gait assistance: Counsellor (Feet): 4 Feet Assistive device: Rolling walker (2 wheeled)        General Gait Details: Pt without LOB, did complain of light headedness after standing from [facility], able to reach recliner safely, further mobility declined  Stairs            Wheelchair Mobility    Modified Rankin (Stroke Patients Only)       Balance Overall balance assessment: Needs assistance Sitting-balance support: Feet supported Sitting balance-Leahy Scale: Good       Standing balance-Leahy Scale: Fair Standing balance comment: able to stand and wipe with supervision                             Pertinent Vitals/Pain Pain Assessment: 0-10 Pain Score: 2  Pain Location: L hip Pain Descriptors / Indicators: Aching;Guarding;Sore Pain Intervention(s): Limited activity within patient's tolerance;Monitored during session;Premedicated before session;Repositioned;Ice applied    Home Living Family/patient expects to be discharged to:: Private residence Living Arrangements: Alone Available Help at Discharge: Family;Available 24 hours/day (niece will be staying with her after discharge) Type of Home: House Home Access: Stairs to enter Entrance Stairs-Rails: Chemical engineer of Steps: 3-4 no rails, 6 with rails Home Layout: One level Home Equipment: Bedside commode;Tub bench;Adaptive equipment;Walker - 2 wheels      Prior Function Level of Independence: Independent         Comments: active and indep prior to admission. No falls     Hand Dominance   Dominant Hand: Right    Extremity/Trunk Assessment   Upper Extremity Assessment Upper Extremity Assessment: Overall WFL for tasks assessed    Lower Extremity Assessment Lower  Extremity Assessment: RLE deficits/detail;LLE deficits/detail RLE Deficits / Details: WFLs LLE Deficits / Details: s/p L THA       Communication   Communication: No difficulties  Cognition Arousal/Alertness: Awake/alert Behavior During Therapy: WFL for tasks assessed/performed Overall Cognitive Status:  Within Functional Limits for tasks assessed                                        General Comments      Exercises Total Joint Exercises Ankle Circles/Pumps: AROM;Strengthening;Both;10 reps Quad Sets: AROM;Strengthening;Both;10 reps Gluteal Sets: AROM;Strengthening;Both;10 reps Other Exercises Other Exercises: Pt educated on PT role, POC, precautions Other Exercises: utilized [facility] with supervision, cueing to maintain hip precautions   Assessment/Plan    PT Assessment Patient needs continued PT services  PT Problem List Decreased strength;Decreased mobility;Decreased safety awareness;Decreased range of motion;Decreased knowledge of precautions;Decreased activity tolerance;Decreased balance;Pain;Decreased knowledge of use of DME       PT Treatment Interventions DME instruction;Therapeutic exercise;Gait training;Balance training;Stair training;Neuromuscular re-education;Functional mobility training;Therapeutic activities;Patient/family education    PT Goals (Current goals can be found in the Care Plan section)  Acute Rehab PT Goals Patient Stated Goal: to go home PT Goal Formulation: With patient Time For Goal Achievement: 06/24/20 Potential to Achieve Goals: Good    Frequency BID   Barriers to discharge        Co-evaluation               AM-PAC PT "6 Clicks" Mobility  Outcome Measure Help needed turning from your back to your side while in a flat bed without using bedrails?: A Little Help needed moving from lying on your back to sitting on the side of a flat bed without using bedrails?: A Little Help needed moving to and from a bed to a chair (including a wheelchair)?: A Little Help needed standing up from a chair using your arms (e.g., wheelchair or bedside chair)?: A Little Help needed to walk in hospital room?: A Little Help needed climbing 3-5 steps with a railing? : A Little 6 Click Score: 18    End of Session Equipment Utilized During  Treatment: Gait belt Activity Tolerance: Patient tolerated treatment well Patient left: in chair;with call bell/phone within reach;with SCD's reapplied Nurse Communication: Mobility status PT Visit Diagnosis: Other abnormalities of gait and mobility (R26.89);Muscle weakness (generalized) (M62.81);Pain;Difficulty in walking, not elsewhere classified (R26.2) Pain - Right/Left: Left Pain - part of body: Hip    Time: 4854-6270 PT Time Calculation (min) (ACUTE ONLY): 30 min   Charges:   PT Evaluation $PT Eval Low Complexity: 1 Low PT Treatments $Therapeutic Exercise: 23-37 mins        Lieutenant Diego PT, DPT 4:19 PM,06/10/20

## 2020-06-10 NOTE — Anesthesia Procedure Notes (Signed)
Spinal  Patient location during procedure: OR Start time: 06/10/2020 7:15 AM End time: 06/10/2020 7:33 AM Staffing Performed: resident/CRNA  Anesthesiologist: Gunnar Fusi, MD Resident/CRNA: Caryl Asp, CRNA Preanesthetic Checklist Completed: patient identified, IV checked, site marked, risks and benefits discussed, surgical consent, monitors and equipment checked, pre-op evaluation and timeout performed Spinal Block Patient position: sitting Prep: Betadine Patient monitoring: heart rate, continuous pulse ox, blood pressure and cardiac monitor Approach: midline Location: L4-5 Injection technique: single-shot Needle Needle type: Whitacre and Introducer  Needle gauge: 24 G Needle length: 9 cm Additional Notes Negative paresthesia. Negative blood return. Positive free-flowing CSF. Expiration date of kit checked and confirmed. Patient tolerated procedure well, without complications.

## 2020-06-10 NOTE — H&P (Signed)
ORTHOPAEDIC HISTORY & PHYSICAL Progress Notes Gwenlyn Fudge, [place] - 06/04/2020 3:45 PM EDT [facility] Chief Complaint:   Chief Complaint  Patient presents with  . Pre-op Exam  H&P surgery on 06/10/20   History of Present Illness:   Rachel James is a 74 y.o. female that presents to clinic today for her preoperative history and evaluation. Patient presents unaccompanied. The patient is scheduled to undergo a left total hip arthroplasty on 06/10/20 by Dr. Marry Guan. Her pain began 5-6 years ago. The pain is located in the left hip and groin. She describes her pain as worse with weightbearing. She reports associated difficulty with ambulating long distances. She denies associated numbness or tingling.   The patient's symptoms have progressed to the point that they decrease her quality of life. The patient has previously undergone conservative treatment including NSAIDS and activity modification without adequate control of her symptoms.  Patient does report difficulty with anesthesia placing her spinal when she had her total knee arthroplasty in December. She asks about undergoing general anesthesia. No significant allergies noted.  Past Medical, Surgical, Family, Social History, Allergies, Medications:   Past Medical History:  Past Medical History:  Diagnosis Date  . DDD (degenerative disc disease), lumbar  . Diverticulitis of colon  . Hip osteoarthritis  . Hypertension  . Lumbar pain  . Lumbar spinal stenosis   Past Surgical History:  Past Surgical History:  Procedure Laterality Date  . ACDF 09/18/2012  C4-5, C6-7  . APPENDECTOMY  . Bunionectomies Bilateral  . Extensive arthroscopic debridment,arthroscopic SLAP repair,subacromial decompression and mini-open biceps tenodesis,right shoulder Right 12/20/2017  Dr.Poggi  . Right total knee arthroplasty using computer-assisted navigation 11/04/2019  Dr Marry Guan  . Right total shoulder  arthroplasty 07/2018  Dr. Onnie Graham  . S/P Breast Biopsy Right  . S/P Tubal Ligation   Current Medications:  Current Outpatient Medications  Medication Sig Dispense Refill  . acetaminophen (TYLENOL) 500 MG tablet Take 500 mg by mouth every 6 (six) hours as needed for Pain.  Marland Kitchen amoxicillin (AMOXIL) 500 MG capsule Take 500 mg by mouth BEFORE DENTAL APPOINTMENT  . aspirin 81 MG EC tablet Take 81 mg by mouth once daily  . carbamide peroxide (DEBROX) 6.5 % otic solution 5 drops 2 (two) times daily as needed.  . cholecalciferol (VITAMIN D3) 1,000 unit capsule Take 1,000 Units by mouth once daily.  . diphenhydrAMINE (BENADRYL) 25 mg capsule Take 25 mg by mouth every 6 (six) hours as needed for Itching  . docusate sodium (STOOL SOFTENER ORAL) Take 1 tablet by mouth once daily  . famotidine (PEPCID) 40 MG tablet  . fluticasone propionate (FLONASE) 50 mcg/actuation nasal spray by Nasal route  . hydroCHLOROthiazide (MICROZIDE) 12.5 mg capsule Take 12.5 mg by mouth once daily  . losartan (COZAAR) 100 MG tablet  . lysine 500 mg Tab Take by mouth  . magnesium oxide 500 mg Tab Take 1 tablet by mouth once daily  . metoprolol succinate (TOPROL-XL) 100 MG XL tablet Take 100 mg by mouth once daily  . multivit with calcium,iron,min (MULTIVITAMIN-CA-IRON-MINERALS ORAL) Take by mouth  . multivitamin-lutein (MULTIVITAMIN 50 PLUS) tablet Take 1 tablet by mouth once daily  . naproxen sodium (ALEVE) 220 MG tablet Take 440 mg by mouth 2 (two) times daily as needed  . simvastatin (ZOCOR) 10 MG tablet Take 1 tablet by mouth once daily  . tiZANidine (ZANAFLEX) 4 MG tablet 4 mg every 8 (eight) hours as needed  . turmeric 400  mg Cap Take by mouth 800 mg cap  . ranitidine (ZANTAC) 150 MG capsule Take 150 mg by mouth 2 (two) times daily as needed   No current facility-administered medications for this visit.   Allergies:  Allergies  Allergen Reactions  . Codeine Itching  Nightmares  . Oxycodone Itching and Other (See  Comments)  (if taken for long period of time)   Social History:  Social History   Socioeconomic History  . Marital status: Divorced  Spouse name: Not on file  . Number of children: 3  . Years of education: 82  . Highest education level: Not on file  Occupational History  . Occupation: Retired Nurse, learning disability  Tobacco Use  . Smoking status: Never Smoker  . Smokeless tobacco: Never Used  Substance and Sexual Activity  . Alcohol use: Not Currently  Alcohol/week: 1.0 standard drinks  Types: 1 Glasses of wine per week  Comment: occassional  . Drug use: Never  . Sexual activity: Defer  Partners: Male  Other Topics Concern  . Not on file  Social History Narrative  . Not on file   Social Determinants of Health   Financial Resource Strain:  . Difficulty of Paying Living Expenses:  Food Insecurity:  . Worried About Charity fundraiser in the Last Year:  . Arboriculturist in the Last Year:  Transportation Needs:  . Film/video editor (Medical):  Marland Kitchen Lack of Transportation (Non-Medical):  Physical Activity:  . Days of Exercise per Week:  . Minutes of Exercise per Session:  Stress:  . Feeling of Stress :  Social Connections:  . Frequency of Communication with Friends and Family:  . Frequency of Social Gatherings with Friends and Family:  . Attends Religious Services:  . Active Member of Clubs or Organizations:  . Attends Archivist Meetings:  Marland Kitchen Marital Status:   Family History:  Family History  Problem Relation Age of Onset  . Heart failure Brother   Review of Systems:   A 10+ ROS was performed, reviewed, and the pertinent orthopaedic findings are documented in the HPI.   Physical Examination:   BP 136/68 (BP Location: Left upper arm, Patient Position: Sitting, BP Cuff Size: Adult)  Ht 160 cm (5\' 3" )  Wt 62.2 kg (137 lb 3.2 oz)  BMI 24.30 kg/m   Patient is a well-developed, well-nourished female in no acute distress. Patient has normal mood and  affect. Patient is alert and oriented to person, place, and time.   HEENT: Atraumatic, normocephalic. Pupils equal and reactive to light. Extraocular motion intact. Noninjected sclera.  Cardiovascular: Regular rate and rhythm, with no murmurs, rubs, or gallops. Distal pulses palpable.  Respiratory: Lungs clear to auscultation bilaterally.   Left Hip: Pelvic tilt: Negative Limb lengths: Equal with the patient standing Soft tissue swelling: Negative Erythema: Negative Crepitance: Negative Tenderness: Greater trochanter is nontender to palpation. Moderate pain is elicited by axial compression or extremes of rotation. Atrophy: No atrophy. Fair to good hip flexor and abductor strength. Range of Motion: EXT/FLEX: 0/0/110 ADD/ABD: 20/0/20 IR/ER: 10/0/30  Sensation is intact over the saphenous, lateral cutaneous, superficial fibular, and deep fibular nerve distributions.  Tests Performed/Reviewed:  X-rays  No new radiographs were obtained today. Previous radiographs were reviewed of the left hip and revealed complete loss of femoral acetabular joint space with bone-on-bone contact, subchondral sclerosis of the bone, and osteophyte formation noted. No fractures or dislocations noted.  Impression:   ICD-10-CM  1. Primary osteoarthritis of left hip M16.12  Plan:   The patient has end-stage degenerative changes of the left hip. It was explained to the patient that the condition is progressive in nature. Having failed conservative treatment, the patient has elected to proceed with a total joint arthroplasty. The patient will undergo a total joint arthroplasty with Dr. Marry Guan. The risks of surgery, including blood clot and infection, were discussed with the patient. Measures to reduce these risks, including the use of anticoagulation, perioperative antibiotics, and early ambulation were discussed. The importance of postoperative physical therapy was discussed with the patient. The patient elects  to proceed with surgery. The patient is instructed to stop all blood thinners prior to surgery. The patient is instructed to call the hospital the day before surgery to learn of the proper arrival time.   Contact our office with any questions or concerns. Follow up as indicated, or sooner should any new problems arise, if conditions worsen, or if they are otherwise concerned.   Gwenlyn Fudge, PA-C [facility] [place], [place] [address] Phone: [phone]  This note was generated in part with voice recognition software and I apologize for any typographical errors that were not detected and corrected.   Electronically signed by Gwenlyn Fudge, PA at 06/04/2020 6:04 PM EDT

## 2020-06-10 NOTE — Progress Notes (Signed)
Patient arrives to unit via bed. Alert and oriented X4. C/O pain 8/10. PRN administered. Surgical site intact with minimal serosanguinous drainage noted. Accordion drain emptied with 100cc out.  Patient oriented to room, call bell, dining services, etc. Voices understanding. Meal tray set up.

## 2020-06-11 ENCOUNTER — Encounter: Payer: Self-pay | Admitting: Orthopedic Surgery

## 2020-06-11 DIAGNOSIS — M1612 Unilateral primary osteoarthritis, left hip: Secondary | ICD-10-CM | POA: Diagnosis not present

## 2020-06-11 NOTE — Progress Notes (Signed)
Physical Therapy Treatment Patient Details Name: Rachel James MRN: [id number] DOB: [birth year] Today's Date: 06/11/2020    History of Present Illness Pt is a 74 yo female s/p L THA, WBAT, posterior approach. PMH of HTN, current smoker, renal disease.    PT Comments    Vitals stable and WFL this AM; no subjective/objective signs of orthostasis with transition to upright.  Able to complete all mobility with no greater than cga/close sup, progressing gait to 120' with RW.  Good understanding and integration of THPs; excellent motivation and desire to progress.      Follow Up Recommendations  Home health PT;Supervision - Intermittent     Equipment Recommendations       Recommendations for Other Services       Precautions / Restrictions Precautions Precautions: Fall;Posterior Hip Restrictions LLE Weight Bearing: Weight bearing as tolerated    Mobility  Bed Mobility Overal bed mobility: Modified Independent Bed Mobility: Supine to Sit              Transfers Overall transfer level: Needs assistance Equipment used: Rolling walker (2 wheeled) Transfers: Sit to/from Stand Sit to Stand: Min guard;Supervision            Ambulation/Gait Ambulation/Gait assistance: Min Gaffer (Feet): 130 Feet Assistive device: Rolling walker (2 wheeled)       General Gait Details: step to progressing to step through gait pattern; fair/good loading and stance phase on L LE with minimal reports of pain with WBing.  Denies symptoms of orthostasis this date; vitals stable and WFL.   Stairs             Wheelchair Mobility    Modified Rankin (Stroke Patients Only)       Balance Overall balance assessment: Needs assistance Sitting-balance support: Feet supported;No upper extremity supported Sitting balance-Leahy Scale: Good     Standing balance support: Bilateral upper extremity supported Standing balance-Leahy Scale: Fair                               Cognition Arousal/Alertness: Awake/alert Behavior During Therapy: WFL for tasks assessed/performed Overall Cognitive Status: Within Functional Limits for tasks assessed                                        Exercises Other Exercises Other Exercises: Sit/stand with RW from various surfaces (bed/recliner), cga/close sup with RW Other Exercises: Reviewed THPs and functional implications-patient able to indep recall 2/3, 3/3 with cuing.  Voiced understanding of all information.    General Comments        Pertinent Vitals/Pain Pain Assessment: 0-10 Pain Score: 3  Pain Location: L hip Pain Descriptors / Indicators: Aching;Guarding;Sore Pain Intervention(s): Limited activity within patient's tolerance;Monitored during session;Premedicated before session;Repositioned    Home Living                      Prior Function            PT Goals (current goals can now be found in the care plan section) Acute Rehab PT Goals Patient Stated Goal: to go home PT Goal Formulation: With patient Time For Goal Achievement: 06/24/20 Potential to Achieve Goals: Good Progress towards PT goals: Progressing toward goals    Frequency    BID      PT Plan Current plan remains appropriate  Co-evaluation              AM-PAC PT "6 Clicks" Mobility   Outcome Measure  Help needed turning from your back to your side while in a flat bed without using bedrails?: None Help needed moving from lying on your back to sitting on the side of a flat bed without using bedrails?: None Help needed moving to and from a bed to a chair (including a wheelchair)?: A Little Help needed standing up from a chair using your arms (e.g., wheelchair or bedside chair)?: A Little Help needed to walk in hospital room?: A Little Help needed climbing 3-5 steps with a railing? : A Little 6 Click Score: 20    End of Session Equipment Utilized During Treatment: Gait  belt Activity Tolerance: Patient tolerated treatment well Patient left: in chair;with call bell/phone within reach;with SCD's reapplied Nurse Communication: Mobility status PT Visit Diagnosis: Other abnormalities of gait and mobility (R26.89);Muscle weakness (generalized) (M62.81);Pain;Difficulty in walking, not elsewhere classified (R26.2) Pain - Right/Left: Left Pain - part of body: Hip     Time: 5831-6742 PT Time Calculation (min) (ACUTE ONLY): 32 min  Charges:  $Gait Training: 8-22 mins $Therapeutic Activity: 8-22 mins                     Loriana Samad H. Owens Shark, PT, DPT, NCS 06/11/20, 9:23 AM [phone]

## 2020-06-11 NOTE — Progress Notes (Signed)
Physical Therapy Treatment Patient Details Name: Rachel James MRN: [id number] DOB: 1946/02/08 Today's Date: 06/11/2020    History of Present Illness Pt is a 74 yo female s/p L THA, WBAT, posterior approach. PMH of HTN, current smoker, renal disease.    PT Comments    Patient able to complete full lap around nursing station, cga/close sup; slow and deliberate cadence, but safe with fair/good weight acceptance and overall stability of L hip. Min cuing for postural extension. Plan to address stair negotiation next session; has 5-6 with bilat rails to enter home. Niece at bedside throughout session; able to observe performance and review THPs, mobility needs for upcoming discharge home.    Follow Up Recommendations  Home health PT;Supervision - Intermittent     Equipment Recommendations       Recommendations for Other Services       Precautions / Restrictions Precautions Precautions: Fall;Posterior Hip Restrictions Weight Bearing Restrictions: Yes LLE Weight Bearing: Weight bearing as tolerated    Mobility  Bed Mobility               General bed mobility comments: seated in recliner beginning, end of treatment session  Transfers Overall transfer level: Needs assistance Equipment used: Rolling walker (2 wheeled) Transfers: Sit to/from Stand Sit to Stand: Supervision            Ambulation/Gait Ambulation/Gait assistance: Supervision Gait Distance (Feet): 200 Feet Assistive device: Rolling walker (2 wheeled)       General Gait Details: reciprocal stepping pattern with slow, but safe, gait performance; mild forward flexion with mild L hip retraction in loading phase, improved with cuing for postural extension.   Stairs             Wheelchair Mobility    Modified Rankin (Stroke Patients Only)       Balance Overall balance assessment: Needs assistance Sitting-balance support: No upper extremity supported;Feet supported Sitting balance-Leahy  Scale: Good     Standing balance support: Bilateral upper extremity supported Standing balance-Leahy Scale: Fair                              Cognition Arousal/Alertness: Awake/alert Behavior During Therapy: WFL for tasks assessed/performed Overall Cognitive Status: Within Functional Limits for tasks assessed                                        Exercises Other Exercises Other Exercises: Seated LE therex, 1x10, active ROM: ankle pumps, LAQs, marching, iso hip abduct/adduct.    General Comments        Pertinent Vitals/Pain Pain Assessment: 0-10 Pain Score: 3  Pain Location: L hip Pain Descriptors / Indicators: Aching;Guarding;Sore Pain Intervention(s): Limited activity within patient's tolerance;Monitored during session;Premedicated before session;Repositioned    Home Living                      Prior Function            PT Goals (current goals can now be found in the care plan section) Acute Rehab PT Goals Patient Stated Goal: to get back to swimming PT Goal Formulation: With patient Time For Goal Achievement: 06/24/20 Potential to Achieve Goals: Good Progress towards PT goals: Progressing toward goals    Frequency    BID      PT Plan Current plan remains appropriate  Co-evaluation              AM-PAC PT "6 Clicks" Mobility   Outcome Measure  Help needed turning from your back to your side while in a flat bed without using bedrails?: None Help needed moving from lying on your back to sitting on the side of a flat bed without using bedrails?: None Help needed moving to and from a bed to a chair (including a wheelchair)?: None Help needed standing up from a chair using your arms (e.g., wheelchair or bedside chair)?: None Help needed to walk in hospital room?: None Help needed climbing 3-5 steps with a railing? : A Little 6 Click Score: 23    End of Session Equipment Utilized During Treatment: Gait  belt Activity Tolerance: Patient tolerated treatment well Patient left: in chair;with call bell/phone within reach;with family/visitor present;with chair alarm set Nurse Communication: Mobility status PT Visit Diagnosis: Other abnormalities of gait and mobility (R26.89);Muscle weakness (generalized) (M62.81);Pain;Difficulty in walking, not elsewhere classified (R26.2) Pain - Right/Left: Left Pain - part of body: Hip     Time: 0638-6854 PT Time Calculation (min) (ACUTE ONLY): 24 min  Charges:  $Gait Training: 8-22 mins $Therapeutic Exercise: 8-22 mins                    Ben Habermann H. Owens Shark, PT, DPT, NCS 06/11/20, 10:11 PM [phone]

## 2020-06-11 NOTE — Evaluation (Signed)
Occupational Therapy Evaluation Patient Details Name: Rachel James MRN: [id number] DOB: Oct 31, 1946 Today's Date: 06/11/2020    History of Present Illness Pt is a 74 yo female s/p L THA, WBAT, posterior approach. PMH of HTN, current smoker, renal disease.   Clinical Impression   Pt seen for OT evaluation this date, POD#1 from above surgery. Pt was independent in all ADL prior to surgery and is eager to return to PLOF with less pain and improved safety and independence. Pt currently requires minimal assist for LB dressing while in seated position due to pain and limited AROM of L hip. Pt able to recall 2/3 posterior total hip precautions at start of session and unable to verbalize how to implement during ADL and mobility. Pt instructed in posterior total hip precautions and how to implement, self care skills, falls prevention strategies, home/routines modifications, DME/AE for LB bathing and dressing tasks, and compression stocking mgt strategies. At end of session, pt able to recall 3/3 posterior total hip precautions with cues for 3rd. Pt would benefit from additional instruction in self care skills and techniques to help maintain precautions with or without assistive devices to support recall and carryover prior to discharge. Recommend intermittent supervision from family upon discharge.      Follow Up Recommendations  Supervision - Intermittent;No OT follow up    Equipment Recommendations  None recommended by OT    Recommendations for Other Services       Precautions / Restrictions Precautions Precautions: Fall;Posterior Hip Precaution Booklet Issued: Yes (comment) Precaution Comments: Pt would benefit from additional instruction in precautions to support recall and carryover Restrictions Weight Bearing Restrictions: Yes LLE Weight Bearing: Weight bearing as tolerated      Mobility Bed Mobility Overal bed mobility: Modified Independent Bed Mobility: Supine to Sit            General bed mobility comments: deferred, up in recliner  Transfers Overall transfer level: Needs assistance Equipment used: Rolling walker (2 wheeled) Transfers: Sit to/from Stand Sit to Stand: Min guard;Supervision              Balance Overall balance assessment: Needs assistance Sitting-balance support: Feet supported;No upper extremity supported Sitting balance-Leahy Scale: Good     Standing balance support: Bilateral upper extremity supported Standing balance-Leahy Scale: Fair                             ADL either performed or assessed with clinical judgement   ADL                                         General ADL Comments: Min A for LB ADL tasks 2/2 hip precautions, CGA for ADL tranfers to maintain precautions     Vision Baseline Vision/History: Wears glasses Wears Glasses: At all times Patient Visual Report: No change from baseline Vision Assessment?: No apparent visual deficits     Perception     Praxis      Pertinent Vitals/Pain Pain Assessment: 0-10 Pain Score: 3  Pain Location: L hip Pain Descriptors / Indicators: Aching;Guarding;Sore Pain Intervention(s): Limited activity within patient's tolerance;Monitored during session;Premedicated before session;Repositioned     Hand Dominance Right   Extremity/Trunk Assessment Upper Extremity Assessment Upper Extremity Assessment: Overall WFL for tasks assessed   Lower Extremity Assessment Lower Extremity Assessment: LLE deficits/detail LLE Deficits / Details: s/p L  THA       Communication Communication Communication: No difficulties   Cognition Arousal/Alertness: Awake/alert Behavior During Therapy: WFL for tasks assessed/performed Overall Cognitive Status: Within Functional Limits for tasks assessed                                     General Comments       Exercises Other Exercises Other Exercises: Sit/stand with RW from various surfaces  (bed/recliner), cga/close sup with RW Other Exercises: Reviewed THPs and functional implications-patient able to indep recall 2/3, 3/3 with cuing.  Voiced understanding of all information. Other Exercises: Pt instructed in posterior THPs and how to maintain during ADL/mobility; recall 2/3, 3/3 with cues; handout provided Other Exercises: Pt instructed in falls prevention, compression stocking mgt, home/routines modifications, and AE/DME to promote independence and safety while maintaining posterior THPs; handout provided   Shoulder Instructions      Home Living Family/patient expects to be discharged to:: Private residence Living Arrangements: Alone Available Help at Discharge: Family;Available 24 hours/day (niece will be staying with her after discharge) Type of Home: House Home Access: Stairs to enter [employer] of Steps: 3-4 no rails, 6 with rails Entrance Stairs-Rails: Left;Right Home Layout: One level     Bathroom Shower/Tub: Teacher, early years/pre: Standard     Home Equipment: Bedside commode;Tub bench;Adaptive equipment;Walker - 2 wheels Adaptive Equipment: Reacher        Prior Functioning/Environment Level of Independence: Independent        Comments: active and indep prior to admission. No falls        OT Problem List: Pain;Decreased strength;Decreased range of motion;Impaired balance (sitting and/or standing);Decreased knowledge of use of DME or AE      OT Treatment/Interventions: Self-care/ADL training;Therapeutic exercise;Therapeutic activities;DME and/or AE instruction;Patient/family education;Balance training    OT Goals(Current goals can be found in the care plan section) Acute Rehab OT Goals Patient Stated Goal: to get back to swimming OT Goal Formulation: With patient Time For Goal Achievement: 06/25/20 Potential to Achieve Goals: Good ADL Goals Pt Will Perform Lower Body Dressing: sit to/from stand;with modified  independence;with adaptive equipment (maintaining posterior THPs) Pt Will Transfer to Toilet: with modified independence;ambulating (BSC over toilet, LRAD for amb, maintaining posterior THPs) Additional ADL Goal #1: Pt will independently verbalize 3/3 posterior THPs and how to maintain during ADL/mobility. Additional ADL Goal #2: Pt will independently instruct family/caregiver in compression stocking mgt.  OT Frequency: Min 1X/week   Barriers to D/C:            Co-evaluation              AM-PAC OT "6 Clicks" Daily Activity     Outcome Measure Help from another person eating meals?: None Help from another person taking care of personal grooming?: None Help from another person toileting, which includes using toliet, bedpan, or urinal?: A Little Help from another person bathing (including washing, rinsing, drying)?: A Little Help from another person to put on and taking off regular upper body clothing?: None Help from another person to put on and taking off regular lower body clothing?: A Little 6 Click Score: 21   End of Session    Activity Tolerance: Patient tolerated treatment well Patient left: in chair;with call bell/phone within reach;with chair alarm set  OT Visit Diagnosis: Other abnormalities of gait and mobility (R26.89);Pain Pain - Right/Left: Left Pain - part of body: Hip  Time: 9136-8599 OT Time Calculation (min): 28 min Charges:  OT General Charges $OT Visit: 1 Visit OT Evaluation $OT Eval Low Complexity: 1 Low OT Treatments $Self Care/Home Management : 23-37 mins  Jeni Salles, MPH, MS, OTR/L ascom [phone] 06/11/20, 10:12 AM

## 2020-06-11 NOTE — Progress Notes (Signed)
°  Subjective: 1 Day Post-Op Procedure(s) (LRB): TOTAL HIP ARTHROPLASTY (Left) Patient reports pain as well-controlled.   Patient is well, but has had some minor complaints of lightheadedness after standing with PT Plan is to go Home after hospital stay. Negative for chest pain and shortness of breath Fever: no Gastrointestinal: negative for nausea and vomiting.   Patient has not had a bowel movement.  Objective: Vital signs in last 24 hours: Temp:  [96.8 F (36 C)-98.1 F (36.7 C)] 97.5 F (36.4 C) (06/24 0734) Pulse Rate:  [50-68] 65 (06/24 0734) Resp:  [11-18] 17 (06/24 0734) BP: (95-166)/(63-94) 119/92 (06/24 0734) SpO2:  [86 %-100 %] 97 % (06/24 0734)  Intake/Output from previous day:  Intake/Output Summary (Last 24 hours) at 06/11/2020 0810 Last data filed at 06/11/2020 0425 Gross per 24 hour  Intake 2231.2 ml  Output 740 ml  Net 1491.2 ml    Intake/Output this shift: No intake/output data recorded.  Labs: No results for input(s): HGB in the last 72 hours. No results for input(s): WBC, RBC, HCT, PLT in the last 72 hours. No results for input(s): NA, K, CL, CO2, BUN, CREATININE, GLUCOSE, CALCIUM in the last 72 hours. No results for input(s): LABPT, INR in the last 72 hours.   EXAM General - Patient is Alert, Appropriate and Oriented Extremity - Neurovascular intact Dorsiflexion/Plantar flexion intact Compartment soft; no TED hose on operative leg Dressing/Incision -clean, dry, Hemovac in place, minimal sanguinous drainage  Motor Function - intact, moving foot and toes well on exam.  Cardiovascular- Regular rate and rhythm, no murmurs/rubs/gallops Respiratory- Lungs clear to auscultation bilaterally Gastrointestinal- soft, nontender and active bowel sounds   Assessment/Plan: 1 Day Post-Op Procedure(s) (LRB): TOTAL HIP ARTHROPLASTY (Left) Active Problems:   Hx of total hip arthroplasty, left  Estimated body mass index is 24.8 kg/m as calculated from the  following:   Height as of this encounter: 5\' 3"  (1.6 m).   Weight as of this encounter: 63.5 kg. Advance diet Up with therapy Plan for discharge tomorrow  TED hose placed.  DVT Prophylaxis - Lovenox, Ted hose and foot pumps Weight-Bearing as tolerated to left leg  Cassell Smiles, PA-C [facility] 06/11/2020, 8:10 AM

## 2020-06-11 NOTE — TOC Progression Note (Signed)
Transition of Care [facility]) - Progression Note    Patient Details  Name: ELNITA SURPRENANT MRN: [id number] Date of Birth: 1946/07/21  Transition of Care [facility]) CM/SW [place], RN Phone Number: 06/11/2020, 2:55 PM  Clinical Narrative:    Met with the patient and her niece in the room to discuss DC plan and needs, She has a RW, 3 in 1, reacher and polar care at home, her niece will be staying with her to help and to provide transportation She is [facility] with [facility] services and they will call her prior to going to see her to set u[ I requested the price of Lovenox and will notify her of the price prior to her DC and will provide a good RX card if needed as her prior surgery No additional needs   Expected Discharge Plan: [place] Barriers to Discharge: Continued Medical Work up  Expected Discharge Plan and Services Expected Discharge Plan: [place]   Discharge Planning Services: CM Consult   Living arrangements for the past 2 months: Single Family Home                 DME Arranged: N/A         HH Arranged: PT HH Agency: [facility] Date [place]: 06/11/20 Time [place]: [place] Representative spoke with at [place]: [place] ([place]) Interventions    Readmission Risk Interventions No flowsheet data found.

## 2020-06-11 NOTE — Anesthesia Postprocedure Evaluation (Signed)
Anesthesia Post Note  Patient: Rachel James  Procedure(s) Performed: TOTAL HIP ARTHROPLASTY (Left Hip)  Patient location during evaluation: Nursing Unit Anesthesia Type: Spinal Level of consciousness: oriented and awake and alert Pain management: pain level controlled Vital Signs Assessment: post-procedure vital signs reviewed and stable Respiratory status: spontaneous breathing and respiratory function stable Cardiovascular status: blood pressure returned to baseline and stable Postop Assessment: no headache, no backache, no apparent nausea or vomiting and patient able to bend at knees Anesthetic complications: no   No complications documented.   Last Vitals:  Vitals:   06/11/20 0734 06/11/20 1154  BP: (!) 119/92 102/70  Pulse: 65 63  Resp: 17 15  Temp: (!) 36.4 C 36.7 C  SpO2: 97% 96%    Last Pain:  Vitals:   06/11/20 1154  TempSrc: Oral  PainSc:                  Alison Stalling

## 2020-06-12 DIAGNOSIS — M1612 Unilateral primary osteoarthritis, left hip: Secondary | ICD-10-CM | POA: Diagnosis not present

## 2020-06-12 LAB — SURGICAL PATHOLOGY

## 2020-06-12 MED ORDER — CELECOXIB 200 MG PO CAPS: 200.0000 mg | ORAL_CAPSULE | Freq: Two times a day (BID) | ORAL | 0 refills | Status: DC

## 2020-06-12 MED ORDER — TRAMADOL HCL 50 MG PO TABS: 50.0000 mg | ORAL_TABLET | ORAL | 0 refills | Status: DC | PRN

## 2020-06-12 MED ORDER — ENOXAPARIN SODIUM 40 MG/0.4ML ~~LOC~~ SOLN
40.0000 mg | SUBCUTANEOUS | 0 refills | Status: DC
Start: 2020-06-12 — End: 2020-11-09

## 2020-06-12 MED ORDER — OXYCODONE HCL 5 MG PO TABS: 5.0000 mg | ORAL_TABLET | ORAL | 0 refills | Status: DC | PRN

## 2020-06-12 NOTE — TOC Transition Note (Addendum)
Transition of Care [facility]) - CM/SW Discharge Note   Patient Details  Name: Rachel James MRN: [id number] Date of Birth: Aug 27, 1946  Transition [facility]) CM/SW Contact:  Su Hilt, RN Phone Number: 06/12/2020, 9:17 AM   Clinical Narrative:    The [facility] is not [facility] by [facility], I called [facility] as they had her for her previous surgery but they are unable to see her prior to Next Thursday July 1st, I called Corene Cornea with [facility] and provided the referral, They are unable to accept the patient, I called Chery With [facility], they are out of network with [place], I called  [facility] and they are out of Network, [facility] they stated that they are not able to see the patient until Thursday July 1st I contacted Dr Marry Guan to see if the patient is ok to wait until Thursday, He notified me that [facility] and Thursday is fine, I notified [name] at [facility] that Thursday would be fine and updated the patient she has DME from a previous surgery and does not need additional, She was provided with a Good RX card and given several options for pharmacies and cost of Lovenox at each pharmacy.  Her niece will be helping her at home, no additional needs   Final next level of care: [place] Barriers to Discharge: Continued Medical Work up   Patient Goals and CMS Choice Patient states their goals for this hospitalization and ongoing recovery are:: go home      Discharge Placement                       Discharge Plan and Services   Discharge Planning Services: CM Consult            DME Arranged: N/A         HH Arranged: PT [place] Agency: [facility] Date [place]: 06/11/20 Time [place]: [place] Representative spoke with at [place]: [place] ([place]) Interventions     Readmission Risk Interventions No flowsheet data found.

## 2020-06-12 NOTE — TOC Benefit Eligibility Note (Signed)
Transition of Care [facility]) Benefit Eligibility Note    Patient Details  Name: ANALIS DISTLER MRN: [id number] Date of Birth: 1946/09/19   Medication/Dose: Enoxaparin 40 mg daily x 14 days  Covered?: Yes (Generic - Enoxaparin)  Tier: Other (Tier 4)  Prescription Coverage [facility]erred Pharmacy[facility]  Spoke with Person/Company/Phone Number:: Anderson [place], [place], [phone]  Co-Pay: $100     Deductible: Unmet ($150 deductible with remaining amount $50)  Additional Notes: Lovenox not on formulary    [place] Phone Number: 06/12/2020, 11:56 AM

## 2020-06-12 NOTE — Progress Notes (Signed)
  Subjective: 2 Days Post-Op Procedure(s) (LRB): TOTAL HIP ARTHROPLASTY (Left) Patient reports pain as moderate.   Patient is well, and has had no acute complaints or problems Plan is to go Home after hospital stay. Negative for chest pain and shortness of breath Fever: no Gastrointestinal: negative for nausea and vomiting.   Patient has had a bowel movement.  Objective: Vital signs in last 24 hours: Temp:  [97.9 F (36.6 C)-98.3 F (36.8 C)] 97.9 F (36.6 C) (06/25 0744) Pulse Rate:  [63-71] 68 (06/25 0744) Resp:  [15-16] 16 (06/25 0744) BP: (100-147)/(70-89) 147/89 (06/25 0744) SpO2:  [96 %-99 %] 99 % (06/25 0744)  Intake/Output from previous day:  Intake/Output Summary (Last 24 hours) at 06/12/2020 0819 Last data filed at 06/12/2020 0129 Gross per 24 hour  Intake 720 ml  Output 225 ml  Net 495 ml    Intake/Output this shift: No intake/output data recorded.  Labs: No results for input(s): HGB in the last 72 hours. No results for input(s): WBC, RBC, HCT, PLT in the last 72 hours. No results for input(s): NA, K, CL, CO2, BUN, CREATININE, GLUCOSE, CALCIUM in the last 72 hours. No results for input(s): LABPT, INR in the last 72 hours.   EXAM General - Patient is Alert, Appropriate and Oriented Extremity - Neurovascular intact Dorsiflexion/Plantar flexion intact Compartment soft Dressing/Incision -Hemovac in place , mild sanguinous drainage noted  Motor Function - intact, moving foot and toes well on exam.  Cardiovascular- Regular rate and rhythm, no murmurs/rubs/gallops Respiratory- Lungs clear to auscultation bilaterally Gastrointestinal- soft and nontender   Assessment/Plan: 2 Days Post-Op Procedure(s) (LRB): TOTAL HIP ARTHROPLASTY (Left) Active Problems:   Hx of total hip arthroplasty, left  Estimated body mass index is 24.8 kg/m as calculated from the following:   Height as of this encounter: 5\' 3"  (1.6 m).   Weight as of this encounter: 63.5 kg. Advance  diet Up with therapy Discharge home with home health  Hemovac removed. Fresh honeycomb placed.  DVT Prophylaxis - Lovenox, Ted hose and foot pumps Weight-Bearing as tolerated to left leg  Cassell Smiles, PA-C [facility] Orthopaedic Surgery 06/12/2020, 8:19 AM

## 2020-06-12 NOTE — Progress Notes (Signed)
Mobility Specialist - Progress Note    06/12/20 0948  Mobility  Activity Ambulated in hall  Level of Assistance Contact guard assist, steadying assist  Assistive Device Front wheel walker  Distance Ambulated (ft) 240 ft  Mobility Response Tolerated well  Mobility performed by Mobility specialist  $Mobility charge 1 Mobility    Pre-mobility: 73 HR, 127/84 BP, 98% SpO2 Post-mobility: 75 HR, 122/67 BP, 98% SpO2   Pt ambulated 240' in hallway with RW. She did c/o pain in the L lateral side of thigh.    Rachel James Mobility Specialist  06/12/20, 10:33 AM

## 2020-06-12 NOTE — Progress Notes (Signed)
Physical Therapy Treatment Patient Details Name: Rachel James MRN: [id number] DOB: 25-Apr-1946 Today's Date: 06/12/2020    History of Present Illness Pt is a 74 yo female s/p L THA, WBAT, posterior approach. PMH of HTN, current smoker, renal disease.    PT Comments    Patient able to complete stair negotiation (up/down 4 with bilat rails, up/down 2 with RW-backwards) without difficulty, cga throughout.  Good stability and control of L LE in modified SLS. Patient eager for upcoming discharge; comfortable with performance and overall mobility needs. No additional questions/concerns at this time.      Follow Up Recommendations  Home health PT;Supervision - Intermittent     Equipment Recommendations       Recommendations for Other Services       Precautions / Restrictions Precautions Precautions: Fall;Posterior Hip Restrictions Weight Bearing Restrictions: Yes RLE Weight Bearing: Weight bearing as tolerated LLE Weight Bearing: Weight bearing as tolerated    Mobility  Bed Mobility Overal bed mobility: Modified Independent Bed Mobility: Sit to Supine     Supine to sit: Modified independent (Device/Increase time)        Transfers Overall transfer level: Modified independent Equipment used: Rolling walker (2 wheeled) Transfers: Sit to/from Stand Sit to Stand: Modified independent (Device/Increase time)         General transfer comment: good hand placement, control with sit/stand  Ambulation/Gait Ambulation/Gait assistance: Supervision;Modified independent (Device/Increase time) Gait Distance (Feet):  (140' x2)         General Gait Details: reciprocal stepping pattern, slow and guarded, but no buckling or LOB; min cuing for postural (and L hip) extension   Stairs Stairs: Yes Stairs assistance: Min guard     General stair comments: up/down 4 with bilat rails, cga/close sup-good technique, control; up/down 2 with RW (backwards), cga-min cuing for technique  and walker placement   Wheelchair Mobility    Modified Rankin (Stroke Patients Only)       Balance Overall balance assessment: Needs assistance Sitting-balance support: No upper extremity supported;Feet supported Sitting balance-Leahy Scale: Good     Standing balance support: Bilateral upper extremity supported Standing balance-Leahy Scale: Fair                              Cognition Arousal/Alertness: Awake/alert Behavior During Therapy: WFL for tasks assessed/performed Overall Cognitive Status: Within Functional Limits for tasks assessed                                        Exercises Other Exercises Other Exercises: Verbally reviewed car transfer technique, positioning (encouraged flat/extended position at times for L hip stretching/extension), activity progression; provided handout with written/pictorial descriptions of supine therex for use as HEP.  Patient voiced understanding of all information.    General Comments        Pertinent Vitals/Pain Pain Assessment: 0-10 Pain Score: 3  Pain Location: L hip Pain Descriptors / Indicators: Aching;Guarding;Sore Pain Intervention(s): Limited activity within patient's tolerance;Monitored during session;Premedicated before session;Repositioned    Home Living                      Prior Function            PT Goals (current goals can now be found in the care plan section) Acute Rehab PT Goals Patient Stated Goal: to get back to swimming  PT Goal Formulation: With patient Time For Goal Achievement: 06/24/20 Potential to Achieve Goals: Good Progress towards PT goals: Progressing toward goals    Frequency    BID      PT Plan Current plan remains appropriate    Co-evaluation              AM-PAC PT "6 Clicks" Mobility   Outcome Measure  Help needed turning from your back to your side while in a flat bed without using bedrails?: None Help needed moving from lying on  your back to sitting on the side of a flat bed without using bedrails?: None Help needed moving to and from a bed to a chair (including a wheelchair)?: None Help needed standing up from a chair using your arms (e.g., wheelchair or bedside chair)?: None Help needed to walk in hospital room?: None Help needed climbing 3-5 steps with a railing? : A Little 6 Click Score: 23    End of Session Equipment Utilized During Treatment: Gait belt Activity Tolerance: Patient tolerated treatment well Patient left: in chair;with call bell/phone within reach;with family/visitor present;with chair alarm set   PT Visit Diagnosis: Other abnormalities of gait and mobility (R26.89);Muscle weakness (generalized) (M62.81);Pain;Difficulty in walking, not elsewhere classified (R26.2) Pain - Right/Left: Left Pain - part of body: Hip     Time: 6578-4696 PT Time Calculation (min) (ACUTE ONLY): 27 min  Charges:  $Gait Training: 8-22 mins $Therapeutic Activity: 8-22 mins                     Keaja Reaume H. Owens Shark, PT, DPT, NCS 06/12/20, 11:04 AM [phone]

## 2020-06-12 NOTE — Discharge Summary (Signed)
Physician Discharge Summary  Patient ID: Rachel James MRN: [id number] DOB/AGE: 1945-12-24 74 y.o.  Admit date: 06/10/2020 Discharge date: 06/12/2020  Admission Diagnoses:  Hx of total hip arthroplasty, left [Z96.642]  Surgeries:Procedure(s):  Left total hip arthroplasty  SURGEON:  Marciano Sequin. M.D.  ASSISTANT: Cassell Smiles, PA-C (present and scrubbed throughout the case, critical for assistance with exposure, retraction, instrumentation, and closure)  ANESTHESIA: spinal  ESTIMATED BLOOD LOSS: 50 mL  FLUIDS REPLACED: 1000 mL of crystalloid  DRAINS: 2 medium drains to a Hemovac reservoir  IMPLANTS UTILIZED: DePuy 15 mm small stature AML femoral stem, 52 mm OD Pinnacle 100 acetabular component, +4 mm 10 degree Pinnacle Marathon polyethylene insert, and a 36 mm M-SPEC +1.5 mm hip ball   Discharge Diagnoses: Patient Active Problem List   Diagnosis Date Noted  . Hx of total hip arthroplasty, left 06/10/2020  . Primary osteoarthritis of left shoulder 06/08/2020  . Cold extremities 04/07/2020  . Primary osteoarthritis of left hip 04/05/2020  . Neck pain, chronic 01/15/2020  . Status post total right knee replacement 11/04/2019  . Cervical radiculitis 08/12/2019  . Dysfunction of Eustachian tube, left 08/12/2019  . Chronic jaw pain 08/12/2019  . Primary osteoarthritis of right knee 03/17/2019  . Scoliosis (and kyphoscoliosis), idiopathic 03/17/2019  . Status post total shoulder arthroplasty 08/09/2018  . Hyponatremia 03/31/2018  . Chronic right shoulder pain 09/26/2017  . Sciatica of left side 08/06/2017  . CKD (chronic kidney disease) stage 2, GFR 60-89 ml/min 07/11/2017  . Osteopenia 06/22/2017  . 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 Shoulder pain, bilateral 09/08/2016  . Need for prophylactic vaccination with Streptococcus pneumoniae (Pneumococcus) and Influenza vaccines 08/16/2016  . Urinary frequency 08/16/2016  . Cutaneous skin  tags 04/18/2016  . Accidental fall 12/09/2015  . Hyperlipidemia LDL goal <100 09/17/2015  . Degenerative arthritis of lumbar spine with cord compression 07/16/2015  . Lumbar and sacral osteoarthritis 07/16/2015  . Bilateral change in hearing 05/27/2015  . Spinal stenosis, multilevel 05/27/2015  . Gastroesophageal reflux disease without esophagitis 05/27/2015  . Chronic radicular low back pain 05/27/2015  . Allergic rhinitis 01/06/2014  . Osteoarthrosis involving multiple sites 01/06/2014  . Arthropathia 01/06/2014  . Hypertension goal BP (blood pressure) < 140/90 01/06/2014  . Arthritis of right knee 01/06/2014    Past Medical History:  Diagnosis Date  . Allergy   . Arthritis    knees, lower back  . Cervical radiculitis   . Chronic radicular low back pain   . CKD (chronic kidney disease) stage 2, GFR 60-89 ml/min 07/11/2017  . Degenerative disc disease, lumbar   . GERD (gastroesophageal reflux disease)   . Hyperlipidemia LDL goal <100   . Hypertension   . Osteopenia 06/22/2017   June 2016 DEXA  . PONV (postoperative nausea and vomiting)    in past.  none recently     Transfusion:    Consultants (if any):   Discharged Condition: Improved  Hospital Course: Rachel James is an 74 y.o. female who was admitted 06/10/2020 with a diagnosis of left hip osteoarthritis and went to the operating room on 06/10/2020 and underwent left total hip arthroplasty through posterior approach. The patient received perioperative antibiotics for prophylaxis (see below). The patient tolerated the procedure well and was transported to PACU in stable condition. After meeting PACU criteria, the patient was subsequently transferred to the Orthopaedics/Rehabilitation unit.   The patient received DVT prophylaxis in the form of early mobilization, Lovenox, Foot Pumps  and TED hose. A sacral pad had been placed and heels were elevated off of the bed with rolled towels in order to protect skin integrity. Foley  catheter was discontinued on postoperative day #0. Wound drains were discontinued on postoperative day #2. The surgical incision was healing well without signs of infection.  Physical therapy was initiated postoperatively for transfers, gait training, and strengthening. Occupational therapy was initiated for activities of daily living and evaluation for assisted devices. Rehabilitation goals were reviewed in detail with the patient. The patient made steady progress with physical therapy and physical therapy recommended discharge to Home.   The patient achieved the preliminary goals of this hospitalization and was felt to be medically and orthopaedically appropriate for discharge.  She was given perioperative antibiotics:  Anti-infectives (From admission, onward)   Start     Dose/Rate Route Frequency Ordered Stop   06/10/20 1330  ceFAZolin (ANCEF) IVPB 2g/100 mL premix        2 g 200 mL/hr over 30 Minutes Intravenous Every 6 hours 06/10/20 1319 06/11/20 0846   06/10/20 0610  ceFAZolin (ANCEF) 2-4 GM/100ML-% IVPB       Note to Pharmacy: Nyra Jabs   : cabinet override      06/10/20 0610 06/10/20 0756   06/10/20 0600  ceFAZolin (ANCEF) IVPB 2g/100 mL premix        2 g 200 mL/hr over 30 Minutes Intravenous On call to O.R. 06/10/20 0128 06/10/20 0240    .  Recent vital signs:  Vitals:   06/11/20 2322 06/12/20 0744  BP: 100/83 (!) 147/89  Pulse: 71 68  Resp: 16 16  Temp: 98.3 F (36.8 C) 97.9 F (36.6 C)  SpO2: 97% 99%    Recent laboratory studies:  No results for input(s): WBC, HGB, HCT, PLT, K, CL, CO2, BUN, CREATININE, GLUCOSE, CALCIUM, LABPT, INR in the last 72 hours.  Diagnostic Studies: DG Hip Port Unilat With Pelvis 1V Left  Result Date: 06/10/2020 CLINICAL DATA:  Status post left hip arthroplasty EXAM: DG HIP (WITH OR WITHOUT PELVIS) 1V PORT LEFT COMPARISON:  None. FINDINGS: Pelvic ring as visualized is intact. Left hip replacement is now seen. Surgical drains and staples  are noted. No acute bony or soft tissue abnormality is seen. IMPRESSION: Left hip replacement. Electronically Signed   By: Inez Catalina M.D.   On: 06/10/2020 12:02   DG Outside Films Extremity  Result Date: 06/10/2020 This examination belongs to an outside facility and is stored here for comparison purposes only.  Contact the originating outside institution for any associated report or interpretation.   Discharge Medications:   Allergies as of 06/12/2020      Reactions   Codeine Itching, Other (See Comments)   nightmare   Oxycodone Itching      Medication List    STOP taking these medications   aspirin EC 81 MG tablet   Naproxen Sodium 220 MG Caps     TAKE these medications   acetaminophen 500 MG tablet Commonly known as: TYLENOL Take 1,000 mg by mouth 2 (two) times daily.   amoxicillin 500 MG capsule Commonly known as: AMOXIL Take 2,000 mg by mouth See admin instructions. Prior to dental appt   carbamide peroxide 6.5 % OTIC solution Commonly known as: DEBROX 5 drops 2 (two) times daily as needed (ear wax).   celecoxib 200 MG capsule Commonly known as: CELEBREX Take 1 capsule (200 mg total) by mouth 2 (two) times daily.   cholecalciferol 1000 units tablet Commonly known  as: VITAMIN D Take 1 tablet (1,000 Units total) by mouth daily.   docusate sodium 100 MG capsule Commonly known as: COLACE Take 100 mg by mouth daily as needed for mild constipation.   enoxaparin 40 MG/0.4ML injection Commonly known as: LOVENOX Inject 0.4 mLs (40 mg total) into the skin daily for 14 days.   famotidine 40 MG tablet Commonly known as: PEPCID Take 1 tablet (40 mg total) by mouth 2 (two) times daily as needed for heartburn or indigestion.   fluticasone 50 MCG/ACT nasal spray Commonly known as: FLONASE Place 1 spray into both nostrils daily as needed for allergies. Uses as needed   hydrochlorothiazide 12.5 MG capsule Commonly known as: MICROZIDE TAKE 1 CAPSULE BY MOUTH EVERY  DAY   losartan 100 MG tablet Commonly known as: COZAAR Take 1 tablet (100 mg total) by mouth daily.   Lysine 500 MG Tabs Take 1 tablet by mouth 2 (two) times daily as needed (cold sore).   magnesium oxide 400 (241.3 Mg) MG tablet Commonly known as: MAG-OX Take 400 mg by mouth daily.   metoprolol succinate 100 MG 24 hr tablet Commonly known as: TOPROL-XL Take 1 tablet (100 mg total) by mouth daily. Take with or immediately following a meal.   MULTIVITAMIN PO Take 1 tablet by mouth daily.   oxyCODONE 5 MG immediate release tablet Commonly known as: Oxy IR/ROXICODONE Take 1 tablet (5 mg total) by mouth every 4 (four) hours as needed for moderate pain (pain score 4-6).   simvastatin 10 MG tablet Commonly known as: ZOCOR Take 1 tablet (10 mg total) by mouth daily.   tiZANidine 4 MG tablet Commonly known as: ZANAFLEX TAKE 1 TABLET (4 MG TOTAL) BY MOUTH EVERY 8 (EIGHT) HOURS AS NEEDED FOR MUSCLE SPASMS.   traMADol 50 MG tablet Commonly known as: ULTRAM Take 1 tablet (50 mg total) by mouth every 4 (four) hours as needed for moderate pain.            Durable Medical Equipment  (From admission, onward)         Start     Ordered   06/10/20 1320  DME Walker rolling  Once       Question:  Patient needs a walker to treat with the following condition  Answer:  S/P total hip arthroplasty   06/10/20 1319   06/10/20 1320  DME Bedside commode  Once       Question:  Patient needs a bedside commode to treat with the following condition  Answer:  S/P total hip arthroplasty   06/10/20 1319          Disposition: home with home health therapy      Follow-up Information    Dereck Leep, MD On 07/23/2020.   Specialty: Orthopedic Surgery Why: at 1:45pm Contact information: [place] [address] [place], PA-C 06/12/2020, 8:38 AM

## 2020-06-12 NOTE — Progress Notes (Signed)
Patient is stable and ready for discharge. Patient IV removed. Writer went over discharge paperwork with patient and she verbalized understanding and had no questions. Patient's niece will be picking up patient Rachel James). Patient's belongings packed and in a belonging bag at bedside. Patient transported via [facility] to private car.

## 2020-06-18 DIAGNOSIS — N182 Chronic kidney disease, stage 2 (mild): Secondary | ICD-10-CM | POA: Diagnosis not present

## 2020-06-18 DIAGNOSIS — M47817 Spondylosis without myelopathy or radiculopathy, lumbosacral region: Secondary | ICD-10-CM | POA: Diagnosis not present

## 2020-06-18 DIAGNOSIS — G8929 Other chronic pain: Secondary | ICD-10-CM | POA: Diagnosis not present

## 2020-06-18 DIAGNOSIS — M4120 Other idiopathic scoliosis, site unspecified: Secondary | ICD-10-CM | POA: Diagnosis not present

## 2020-06-18 DIAGNOSIS — Z471 Aftercare following joint replacement surgery: Secondary | ICD-10-CM | POA: Diagnosis not present

## 2020-06-18 DIAGNOSIS — M5136 Other intervertebral disc degeneration, lumbar region: Secondary | ICD-10-CM | POA: Diagnosis not present

## 2020-06-18 DIAGNOSIS — M5412 Radiculopathy, cervical region: Secondary | ICD-10-CM | POA: Diagnosis not present

## 2020-06-18 DIAGNOSIS — M159 Polyosteoarthritis, unspecified: Secondary | ICD-10-CM | POA: Diagnosis not present

## 2020-06-18 DIAGNOSIS — I129 Hypertensive chronic kidney disease with stage 1 through stage 4 chronic kidney disease, or unspecified chronic kidney disease: Secondary | ICD-10-CM | POA: Diagnosis not present

## 2020-06-18 DIAGNOSIS — M48061 Spinal stenosis, lumbar region without neurogenic claudication: Secondary | ICD-10-CM | POA: Diagnosis not present

## 2020-06-23 DIAGNOSIS — Z471 Aftercare following joint replacement surgery: Secondary | ICD-10-CM | POA: Diagnosis not present

## 2020-06-24 DIAGNOSIS — N182 Chronic kidney disease, stage 2 (mild): Secondary | ICD-10-CM | POA: Diagnosis not present

## 2020-06-24 DIAGNOSIS — G8929 Other chronic pain: Secondary | ICD-10-CM | POA: Diagnosis not present

## 2020-06-24 DIAGNOSIS — M47817 Spondylosis without myelopathy or radiculopathy, lumbosacral region: Secondary | ICD-10-CM | POA: Diagnosis not present

## 2020-06-24 DIAGNOSIS — Z471 Aftercare following joint replacement surgery: Secondary | ICD-10-CM | POA: Diagnosis not present

## 2020-06-24 DIAGNOSIS — M5412 Radiculopathy, cervical region: Secondary | ICD-10-CM | POA: Diagnosis not present

## 2020-06-24 DIAGNOSIS — I129 Hypertensive chronic kidney disease with stage 1 through stage 4 chronic kidney disease, or unspecified chronic kidney disease: Secondary | ICD-10-CM | POA: Diagnosis not present

## 2020-06-24 DIAGNOSIS — M159 Polyosteoarthritis, unspecified: Secondary | ICD-10-CM | POA: Diagnosis not present

## 2020-06-24 DIAGNOSIS — M5136 Other intervertebral disc degeneration, lumbar region: Secondary | ICD-10-CM | POA: Diagnosis not present

## 2020-06-24 DIAGNOSIS — M4120 Other idiopathic scoliosis, site unspecified: Secondary | ICD-10-CM | POA: Diagnosis not present

## 2020-06-24 DIAGNOSIS — M48061 Spinal stenosis, lumbar region without neurogenic claudication: Secondary | ICD-10-CM | POA: Diagnosis not present

## 2020-06-25 DIAGNOSIS — M159 Polyosteoarthritis, unspecified: Secondary | ICD-10-CM | POA: Diagnosis not present

## 2020-06-25 DIAGNOSIS — M5412 Radiculopathy, cervical region: Secondary | ICD-10-CM | POA: Diagnosis not present

## 2020-06-25 DIAGNOSIS — N182 Chronic kidney disease, stage 2 (mild): Secondary | ICD-10-CM | POA: Diagnosis not present

## 2020-06-25 DIAGNOSIS — M47817 Spondylosis without myelopathy or radiculopathy, lumbosacral region: Secondary | ICD-10-CM | POA: Diagnosis not present

## 2020-06-25 DIAGNOSIS — M48061 Spinal stenosis, lumbar region without neurogenic claudication: Secondary | ICD-10-CM | POA: Diagnosis not present

## 2020-06-25 DIAGNOSIS — M4120 Other idiopathic scoliosis, site unspecified: Secondary | ICD-10-CM | POA: Diagnosis not present

## 2020-06-25 DIAGNOSIS — I129 Hypertensive chronic kidney disease with stage 1 through stage 4 chronic kidney disease, or unspecified chronic kidney disease: Secondary | ICD-10-CM | POA: Diagnosis not present

## 2020-06-25 DIAGNOSIS — Z471 Aftercare following joint replacement surgery: Secondary | ICD-10-CM | POA: Diagnosis not present

## 2020-06-25 DIAGNOSIS — M5136 Other intervertebral disc degeneration, lumbar region: Secondary | ICD-10-CM | POA: Diagnosis not present

## 2020-06-25 DIAGNOSIS — G8929 Other chronic pain: Secondary | ICD-10-CM | POA: Diagnosis not present

## 2020-06-26 DIAGNOSIS — G8929 Other chronic pain: Secondary | ICD-10-CM | POA: Diagnosis not present

## 2020-06-26 DIAGNOSIS — I129 Hypertensive chronic kidney disease with stage 1 through stage 4 chronic kidney disease, or unspecified chronic kidney disease: Secondary | ICD-10-CM | POA: Diagnosis not present

## 2020-06-26 DIAGNOSIS — M4120 Other idiopathic scoliosis, site unspecified: Secondary | ICD-10-CM | POA: Diagnosis not present

## 2020-06-26 DIAGNOSIS — M5136 Other intervertebral disc degeneration, lumbar region: Secondary | ICD-10-CM | POA: Diagnosis not present

## 2020-06-26 DIAGNOSIS — N182 Chronic kidney disease, stage 2 (mild): Secondary | ICD-10-CM | POA: Diagnosis not present

## 2020-06-26 DIAGNOSIS — M48061 Spinal stenosis, lumbar region without neurogenic claudication: Secondary | ICD-10-CM | POA: Diagnosis not present

## 2020-06-26 DIAGNOSIS — Z471 Aftercare following joint replacement surgery: Secondary | ICD-10-CM | POA: Diagnosis not present

## 2020-06-26 DIAGNOSIS — M47817 Spondylosis without myelopathy or radiculopathy, lumbosacral region: Secondary | ICD-10-CM | POA: Diagnosis not present

## 2020-06-26 DIAGNOSIS — M5412 Radiculopathy, cervical region: Secondary | ICD-10-CM | POA: Diagnosis not present

## 2020-06-26 DIAGNOSIS — M159 Polyosteoarthritis, unspecified: Secondary | ICD-10-CM | POA: Diagnosis not present

## 2020-06-30 DIAGNOSIS — M5136 Other intervertebral disc degeneration, lumbar region: Secondary | ICD-10-CM | POA: Diagnosis not present

## 2020-06-30 DIAGNOSIS — Z471 Aftercare following joint replacement surgery: Secondary | ICD-10-CM | POA: Diagnosis not present

## 2020-06-30 DIAGNOSIS — G8929 Other chronic pain: Secondary | ICD-10-CM | POA: Diagnosis not present

## 2020-06-30 DIAGNOSIS — I129 Hypertensive chronic kidney disease with stage 1 through stage 4 chronic kidney disease, or unspecified chronic kidney disease: Secondary | ICD-10-CM | POA: Diagnosis not present

## 2020-06-30 DIAGNOSIS — M47817 Spondylosis without myelopathy or radiculopathy, lumbosacral region: Secondary | ICD-10-CM | POA: Diagnosis not present

## 2020-06-30 DIAGNOSIS — M159 Polyosteoarthritis, unspecified: Secondary | ICD-10-CM | POA: Diagnosis not present

## 2020-06-30 DIAGNOSIS — M48061 Spinal stenosis, lumbar region without neurogenic claudication: Secondary | ICD-10-CM | POA: Diagnosis not present

## 2020-06-30 DIAGNOSIS — M5412 Radiculopathy, cervical region: Secondary | ICD-10-CM | POA: Diagnosis not present

## 2020-06-30 DIAGNOSIS — M4120 Other idiopathic scoliosis, site unspecified: Secondary | ICD-10-CM | POA: Diagnosis not present

## 2020-06-30 DIAGNOSIS — N182 Chronic kidney disease, stage 2 (mild): Secondary | ICD-10-CM | POA: Diagnosis not present

## 2020-07-01 DIAGNOSIS — M159 Polyosteoarthritis, unspecified: Secondary | ICD-10-CM | POA: Diagnosis not present

## 2020-07-01 DIAGNOSIS — I129 Hypertensive chronic kidney disease with stage 1 through stage 4 chronic kidney disease, or unspecified chronic kidney disease: Secondary | ICD-10-CM | POA: Diagnosis not present

## 2020-07-01 DIAGNOSIS — M47817 Spondylosis without myelopathy or radiculopathy, lumbosacral region: Secondary | ICD-10-CM | POA: Diagnosis not present

## 2020-07-01 DIAGNOSIS — Z471 Aftercare following joint replacement surgery: Secondary | ICD-10-CM | POA: Diagnosis not present

## 2020-07-01 DIAGNOSIS — M4120 Other idiopathic scoliosis, site unspecified: Secondary | ICD-10-CM | POA: Diagnosis not present

## 2020-07-01 DIAGNOSIS — M48061 Spinal stenosis, lumbar region without neurogenic claudication: Secondary | ICD-10-CM | POA: Diagnosis not present

## 2020-07-01 DIAGNOSIS — M5136 Other intervertebral disc degeneration, lumbar region: Secondary | ICD-10-CM | POA: Diagnosis not present

## 2020-07-01 DIAGNOSIS — G8929 Other chronic pain: Secondary | ICD-10-CM | POA: Diagnosis not present

## 2020-07-01 DIAGNOSIS — M5412 Radiculopathy, cervical region: Secondary | ICD-10-CM | POA: Diagnosis not present

## 2020-07-01 DIAGNOSIS — N182 Chronic kidney disease, stage 2 (mild): Secondary | ICD-10-CM | POA: Diagnosis not present

## 2020-07-01 DIAGNOSIS — R69 Illness, unspecified: Secondary | ICD-10-CM | POA: Diagnosis not present

## 2020-07-03 ENCOUNTER — Telehealth: Payer: Self-pay | Admitting: Internal Medicine

## 2020-07-03 DIAGNOSIS — M5412 Radiculopathy, cervical region: Secondary | ICD-10-CM | POA: Diagnosis not present

## 2020-07-03 DIAGNOSIS — M4120 Other idiopathic scoliosis, site unspecified: Secondary | ICD-10-CM | POA: Diagnosis not present

## 2020-07-03 DIAGNOSIS — M47817 Spondylosis without myelopathy or radiculopathy, lumbosacral region: Secondary | ICD-10-CM | POA: Diagnosis not present

## 2020-07-03 DIAGNOSIS — N182 Chronic kidney disease, stage 2 (mild): Secondary | ICD-10-CM | POA: Diagnosis not present

## 2020-07-03 DIAGNOSIS — M159 Polyosteoarthritis, unspecified: Secondary | ICD-10-CM | POA: Diagnosis not present

## 2020-07-03 DIAGNOSIS — M5136 Other intervertebral disc degeneration, lumbar region: Secondary | ICD-10-CM | POA: Diagnosis not present

## 2020-07-03 DIAGNOSIS — G8929 Other chronic pain: Secondary | ICD-10-CM | POA: Diagnosis not present

## 2020-07-03 DIAGNOSIS — Z471 Aftercare following joint replacement surgery: Secondary | ICD-10-CM | POA: Diagnosis not present

## 2020-07-03 DIAGNOSIS — I129 Hypertensive chronic kidney disease with stage 1 through stage 4 chronic kidney disease, or unspecified chronic kidney disease: Secondary | ICD-10-CM | POA: Diagnosis not present

## 2020-07-03 DIAGNOSIS — M48061 Spinal stenosis, lumbar region without neurogenic claudication: Secondary | ICD-10-CM | POA: Diagnosis not present

## 2020-07-03 NOTE — Chronic Care Management (AMB) (Signed)
  Chronic Care Management   Note  07/03/2020 Name: Rachel James MRN: [id number] DOB: 1946/12/13  Rachel James is a 74 y.o. year old female who is a primary care patient of Towanda Malkin, MD. I reached out to Rachel James by phone today in response to a referral sent by Rachel James health plan.     Rachel James was given information about Chronic Care Management services today including:  1. CCM service includes personalized support from designated clinical staff supervised by her physician, including individualized plan of care and coordination with other care providers 2. 24/7 contact phone numbers for assistance for urgent and routine care needs. 3. Service will only be billed when office clinical staff spend 20 minutes or more in a month to coordinate care. 4. Only one practitioner may furnish and bill the service in a calendar month. 5. The patient may stop CCM services at any time (effective at the end of the month) by phone call to the office staff. 6. The patient will be responsible for cost sharing (co-pay) of up to 20% of the service fee (after annual deductible is met).  Patient agreed to services and verbal consent obtained.   Follow up plan: Telephone appointment with care management team member scheduled for:08/05/2020  Rachel James, [address], [place], [place] [address] Direct Dial: [phone] [email or web address] Website: [email or web address]

## 2020-07-13 DIAGNOSIS — M6281 Muscle weakness (generalized): Secondary | ICD-10-CM | POA: Diagnosis not present

## 2020-07-13 DIAGNOSIS — M25552 Pain in left hip: Secondary | ICD-10-CM | POA: Diagnosis not present

## 2020-07-13 DIAGNOSIS — Z96642 Presence of left artificial hip joint: Secondary | ICD-10-CM | POA: Diagnosis not present

## 2020-07-13 DIAGNOSIS — M25652 Stiffness of left hip, not elsewhere classified: Secondary | ICD-10-CM | POA: Diagnosis not present

## 2020-07-15 DIAGNOSIS — M25652 Stiffness of left hip, not elsewhere classified: Secondary | ICD-10-CM | POA: Diagnosis not present

## 2020-07-15 DIAGNOSIS — M6281 Muscle weakness (generalized): Secondary | ICD-10-CM | POA: Diagnosis not present

## 2020-07-15 DIAGNOSIS — Z96642 Presence of left artificial hip joint: Secondary | ICD-10-CM | POA: Diagnosis not present

## 2020-07-15 DIAGNOSIS — M25552 Pain in left hip: Secondary | ICD-10-CM | POA: Diagnosis not present

## 2020-07-20 DIAGNOSIS — M25552 Pain in left hip: Secondary | ICD-10-CM | POA: Diagnosis not present

## 2020-07-20 DIAGNOSIS — Z96642 Presence of left artificial hip joint: Secondary | ICD-10-CM | POA: Diagnosis not present

## 2020-07-20 DIAGNOSIS — M25652 Stiffness of left hip, not elsewhere classified: Secondary | ICD-10-CM | POA: Diagnosis not present

## 2020-07-20 DIAGNOSIS — M6281 Muscle weakness (generalized): Secondary | ICD-10-CM | POA: Diagnosis not present

## 2020-07-21 DIAGNOSIS — B078 Other viral warts: Secondary | ICD-10-CM | POA: Diagnosis not present

## 2020-07-21 DIAGNOSIS — L820 Inflamed seborrheic keratosis: Secondary | ICD-10-CM | POA: Diagnosis not present

## 2020-07-21 DIAGNOSIS — L538 Other specified erythematous conditions: Secondary | ICD-10-CM | POA: Diagnosis not present

## 2020-07-22 DIAGNOSIS — M25552 Pain in left hip: Secondary | ICD-10-CM | POA: Diagnosis not present

## 2020-07-22 DIAGNOSIS — Z96642 Presence of left artificial hip joint: Secondary | ICD-10-CM | POA: Diagnosis not present

## 2020-07-22 DIAGNOSIS — M6281 Muscle weakness (generalized): Secondary | ICD-10-CM | POA: Diagnosis not present

## 2020-07-22 DIAGNOSIS — M25652 Stiffness of left hip, not elsewhere classified: Secondary | ICD-10-CM | POA: Diagnosis not present

## 2020-07-23 DIAGNOSIS — Z96642 Presence of left artificial hip joint: Secondary | ICD-10-CM | POA: Diagnosis not present

## 2020-07-23 DIAGNOSIS — M5441 Lumbago with sciatica, right side: Secondary | ICD-10-CM | POA: Diagnosis not present

## 2020-07-23 DIAGNOSIS — G8929 Other chronic pain: Secondary | ICD-10-CM | POA: Diagnosis not present

## 2020-07-23 DIAGNOSIS — M48061 Spinal stenosis, lumbar region without neurogenic claudication: Secondary | ICD-10-CM | POA: Diagnosis not present

## 2020-07-25 ENCOUNTER — Other Ambulatory Visit: Payer: Self-pay | Admitting: Orthopedic Surgery

## 2020-07-25 DIAGNOSIS — G8929 Other chronic pain: Secondary | ICD-10-CM

## 2020-07-26 ENCOUNTER — Encounter: Payer: Self-pay | Admitting: Internal Medicine

## 2020-07-27 ENCOUNTER — Other Ambulatory Visit: Payer: Self-pay

## 2020-07-27 DIAGNOSIS — I10 Essential (primary) hypertension: Secondary | ICD-10-CM

## 2020-07-27 DIAGNOSIS — M25552 Pain in left hip: Secondary | ICD-10-CM | POA: Diagnosis not present

## 2020-07-27 DIAGNOSIS — Z96642 Presence of left artificial hip joint: Secondary | ICD-10-CM | POA: Diagnosis not present

## 2020-07-27 DIAGNOSIS — M6281 Muscle weakness (generalized): Secondary | ICD-10-CM | POA: Diagnosis not present

## 2020-07-27 DIAGNOSIS — M25652 Stiffness of left hip, not elsewhere classified: Secondary | ICD-10-CM | POA: Diagnosis not present

## 2020-07-27 MED ORDER — LOSARTAN POTASSIUM 100 MG PO TABS: 100.0000 mg | ORAL_TABLET | Freq: Every day | ORAL | 3 refills | Status: DC

## 2020-07-28 ENCOUNTER — Ambulatory Visit
Admission: RE | Admit: 2020-07-28 | Discharge: 2020-07-28 | Disposition: A | Discharge status: Home / Self Care | Payer: Medicare HMO | Source: Ambulatory Visit | Attending: Orthopedic Surgery | Admitting: Orthopedic Surgery

## 2020-07-28 ENCOUNTER — Other Ambulatory Visit: Payer: Self-pay

## 2020-07-28 DIAGNOSIS — G8929 Other chronic pain: Secondary | ICD-10-CM

## 2020-07-28 DIAGNOSIS — M5441 Lumbago with sciatica, right side: Secondary | ICD-10-CM | POA: Insufficient documentation

## 2020-07-28 DIAGNOSIS — M545 Low back pain: Secondary | ICD-10-CM | POA: Diagnosis not present

## 2020-07-29 DIAGNOSIS — M25552 Pain in left hip: Secondary | ICD-10-CM | POA: Diagnosis not present

## 2020-07-29 DIAGNOSIS — Z96642 Presence of left artificial hip joint: Secondary | ICD-10-CM | POA: Diagnosis not present

## 2020-07-29 DIAGNOSIS — M6281 Muscle weakness (generalized): Secondary | ICD-10-CM | POA: Diagnosis not present

## 2020-07-29 DIAGNOSIS — M25652 Stiffness of left hip, not elsewhere classified: Secondary | ICD-10-CM | POA: Diagnosis not present

## 2020-08-03 DIAGNOSIS — M25652 Stiffness of left hip, not elsewhere classified: Secondary | ICD-10-CM | POA: Diagnosis not present

## 2020-08-03 DIAGNOSIS — Z96642 Presence of left artificial hip joint: Secondary | ICD-10-CM | POA: Diagnosis not present

## 2020-08-03 DIAGNOSIS — M6281 Muscle weakness (generalized): Secondary | ICD-10-CM | POA: Diagnosis not present

## 2020-08-03 DIAGNOSIS — M25552 Pain in left hip: Secondary | ICD-10-CM | POA: Diagnosis not present

## 2020-08-04 DIAGNOSIS — M48061 Spinal stenosis, lumbar region without neurogenic claudication: Secondary | ICD-10-CM | POA: Diagnosis not present

## 2020-08-05 ENCOUNTER — Ambulatory Visit (INDEPENDENT_AMBULATORY_CARE_PROVIDER_SITE_OTHER): Payer: Medicare HMO

## 2020-08-05 DIAGNOSIS — M6281 Muscle weakness (generalized): Secondary | ICD-10-CM | POA: Diagnosis not present

## 2020-08-05 DIAGNOSIS — Z96642 Presence of left artificial hip joint: Secondary | ICD-10-CM | POA: Diagnosis not present

## 2020-08-05 DIAGNOSIS — I10 Essential (primary) hypertension: Secondary | ICD-10-CM

## 2020-08-05 DIAGNOSIS — M25552 Pain in left hip: Secondary | ICD-10-CM | POA: Diagnosis not present

## 2020-08-05 DIAGNOSIS — M25652 Stiffness of left hip, not elsewhere classified: Secondary | ICD-10-CM | POA: Diagnosis not present

## 2020-08-05 DIAGNOSIS — E785 Hyperlipidemia, unspecified: Secondary | ICD-10-CM | POA: Diagnosis not present

## 2020-08-05 NOTE — Chronic Care Management (AMB) (Signed)
Chronic Care Management   Initial Visit Note  08/05/2020 Name: Rachel James MRN: [id number] DOB: 1946-10-04  Primary Care Provider: Towanda Malkin, MD Reason for referral : Chronic Care Management   Rachel James is a 74 y.o. year old female who is a primary care patient of Towanda Malkin, MD. The CCM team was consulted for assistance with chronic disease management and care coordination. A telephonic assessment was conducted today.   Review of Mr. Boulos status, including review of consultants reports, relevant labs and test results was conducted today. Collaboration with appropriate care team members was performed as part of the comprehensive evaluation and provision of chronic care management services.    SDOH (Social Determinants of Health) assessments performed: Yes See Care Plan activities for detailed interventions related to SDOH  SDOH Interventions     Most Recent Value  SDOH Interventions  Food Insecurity Interventions Intervention Not Indicated  Housing Interventions Intervention Not Indicated  Transportation Interventions Intervention Not Indicated       Medications: Outpatient Encounter Medications as of 08/05/2020  Medication Sig  . acetaminophen (TYLENOL) 500 MG tablet Take 1,000 mg by mouth 2 (two) times daily.  Marland Kitchen amoxicillin (AMOXIL) 500 MG capsule Take 2,000 mg by mouth See admin instructions. Prior to dental appt   . carbamide peroxide (DEBROX) 6.5 % OTIC solution 5 drops 2 (two) times daily as needed (ear wax).   . cholecalciferol (VITAMIN D) 1000 units tablet Take 1 tablet (1,000 Units total) by mouth daily.  Marland Kitchen docusate sodium (COLACE) 100 MG capsule Take 100 mg by mouth daily as needed for mild constipation.   . famotidine (PEPCID) 40 MG tablet Take 1 tablet (40 mg total) by mouth 2 (two) times daily as needed for heartburn or indigestion.  . fluticasone (FLONASE) 50 MCG/ACT nasal spray Place 1 spray into both nostrils daily as needed for  allergies. Uses as needed  . hydrochlorothiazide (MICROZIDE) 12.5 MG capsule TAKE 1 CAPSULE BY MOUTH EVERY DAY  . losartan (COZAAR) 100 MG tablet Take 1 tablet (100 mg total) by mouth daily.  Marland Kitchen Lysine 500 MG TABS Take 1 tablet by mouth 2 (two) times daily as needed (cold sore).   . magnesium oxide (MAG-OX) 400 (241.3 Mg) MG tablet Take 400 mg by mouth daily.   . metoprolol succinate (TOPROL-XL) 100 MG 24 hr tablet Take 1 tablet (100 mg total) by mouth daily. Take with or immediately following a meal.  . Multiple Vitamins-Minerals (MULTIVITAMIN PO) Take 1 tablet by mouth daily.  . simvastatin (ZOCOR) 10 MG tablet Take 1 tablet (10 mg total) by mouth daily.  Marland Kitchen tiZANidine (ZANAFLEX) 4 MG tablet TAKE 1 TABLET (4 MG TOTAL) BY MOUTH EVERY 8 (EIGHT) HOURS AS NEEDED FOR MUSCLE SPASMS.  . celecoxib (CELEBREX) 200 MG capsule Take 1 capsule (200 mg total) by mouth 2 (two) times daily. (Patient not taking: Reported on 08/05/2020)  . enoxaparin (LOVENOX) 40 MG/0.4ML injection Inject 0.4 mLs (40 mg total) into the skin daily for 14 days.  Marland Kitchen oxyCODONE (OXY IR/ROXICODONE) 5 MG immediate release tablet Take 1 tablet (5 mg total) by mouth every 4 (four) hours as needed for moderate pain (pain score 4-6).  Marland Kitchen traMADol (ULTRAM) 50 MG tablet Take 1 tablet (50 mg total) by mouth every 4 (four) hours as needed for moderate pain.   No facility-administered encounter medications on file as of 08/05/2020.     Objective:  BP Readings from Last 3 Encounters:  06/12/20 (!) 147/89  04/07/20  122/86  02/28/20 140/89   Lab Results  Component Value Date   CHOL 204 (H) 04/07/2020   HDL 87 04/07/2020   LDLCALC 101 (H) 04/07/2020   TRIG 74 04/07/2020   CHOLHDL 2.3 04/07/2020     [name] was given information about Chronic Care Management services including:  1. CCM service includes personalized support from designated clinical staff supervised by her physician, including individualized plan of care and coordination with  other care providers 2. 24/7 contact phone numbers for assistance for urgent and routine care needs. 3. Service will only be billed when office clinical staff spend 20 minutes or more in a month to coordinate care. 4. Only one practitioner may furnish and bill the service in a calendar month. 5. The patient may stop CCM services at any time (effective at the end of the month) by phone call to the office staff. 6. The patient will be responsible for cost sharing (co-pay) of up to 20% of the service fee (after annual deductible is met).  Patient agreed to services and verbal consent obtained.    PLAN The care management team will follow-up with Ms. Strahm within the next two months.    C[facility] Care Management [facility] [phone]

## 2020-08-18 DIAGNOSIS — M48061 Spinal stenosis, lumbar region without neurogenic claudication: Secondary | ICD-10-CM | POA: Diagnosis not present

## 2020-08-20 IMAGING — DX DG KNEE 1-2V PORT*R*
2 series · 2 of 2 positions shown · non-contrast
Comparison: None.

CLINICAL DATA: Right knee replacement

EXAM:
PORTABLE RIGHT KNEE - 1-2 VIEW

[knee ap]
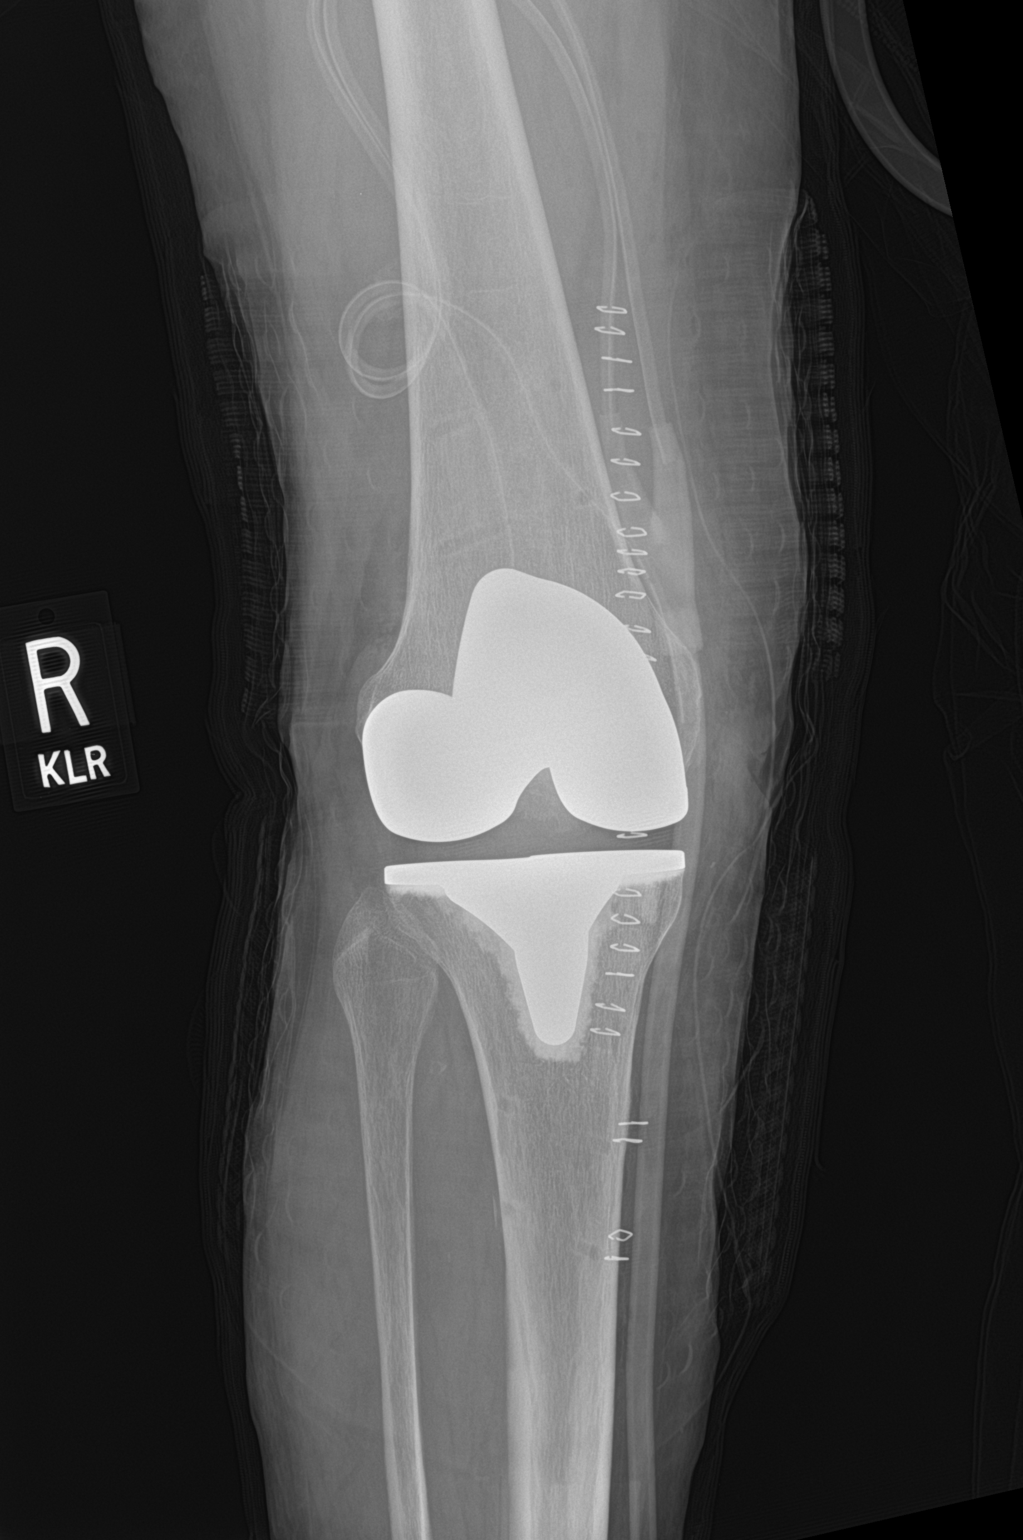

[knee lat]
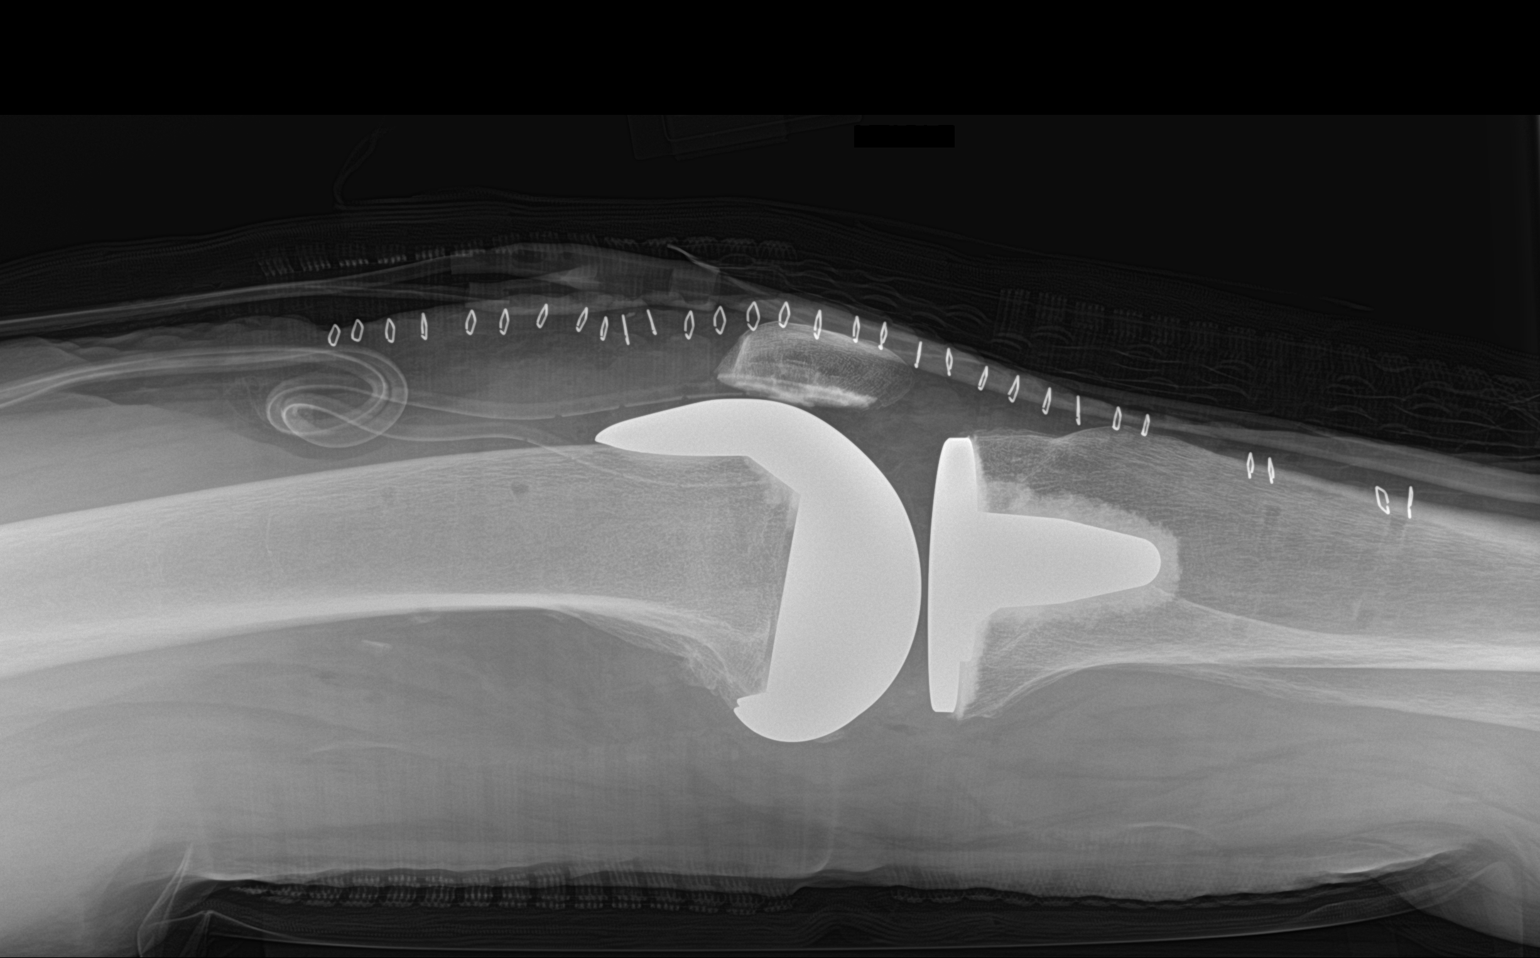

[2 of 2 positions shown; findings below may reference images not displayed]

FINDINGS: Post right total knee arthroplasty. Drain is present. Alignment is
anatomic.
IMPRESSION: Standard postoperative appearance of right total knee arthroplasty.

## 2020-08-24 ENCOUNTER — Encounter: Payer: Self-pay | Admitting: Internal Medicine

## 2020-08-25 ENCOUNTER — Other Ambulatory Visit: Payer: Self-pay

## 2020-08-25 NOTE — Patient Instructions (Signed)
  Thank you for allowing the Chronic Care Management team to participate in your care.   Goals Addressed            This Visit's Progress   . Chronic Disease Management       CARE PLAN ENTRY (see longitudinal plan of care for additional care plan information)  Current Barriers:  . Chronic Disease Management support and education needs related to HTN, GERD, CKD and HLD.  Case Manager Clinical Goal(s):  Over the next 120 days: Marland Kitchen Monitor BP and record readings. . Continue taking medications as prescribed. . Attend medical appointments as scheduled. . Follow precautions and activity restrictions as outlined by Orthopedic team to prevent falls and injuries.  Interventions:  . Inter-disciplinary care team collaboration (see longitudinal plan of care) . Reviewed medications. Encouraged to continue taking as prescribed and notify care team with concerns regarding prescription costs. . Discussed established BP parameters and indications for notifying a provider. Encouraged to monitor routinely if unable to monitor daily and record readings.  . Discussed safety and fall prevention measures. Reviewed treatment recommendations and activity restrictions. Encouraged to use an assistive device as needed when ambulating. Currently attending Physical Therapy sessions d/t hip surgery in June. Reports being evaluated by Neurosurgery on yesterday d/t concerns with her lower back. Pending insurance provider's approval for EMG. . Reviewed scheduled/upcoming appointments. Encouraged to continue attending as scheduled to prevent delays in care. Encouraged to notify care management team with concerns regarding transportation.  . Discussed plan for ongoing care management and follow-up. Provided direct contact information for care management team. Rachel James is doing well and functions independently in her home. We will plan to follow-up within the next few months to address the Neurosurgery team's treatment plan.  She will be due for her annual PCP visit in within the next few months but may be out of the area in November. The clinic was contacted. Appointment scheduled for 10/12/20.   Patient Self Care Activities:  . Self administers medications  . Attends scheduled provider appointments . Calls pharmacy for medication refills . Performs ADL's independently . Performs IADL's independently . Calls provider office for new concerns or questions   Initial goal documentation        Rachel James was given information about Chronic Care Management services including:  1. CCM service includes personalized support from designated clinical staff supervised by her physician, including individualized plan of care and coordination with other care providers 2. 24/7 contact phone numbers for assistance for urgent and routine care needs. 3. Service will only be billed when office clinical staff spend 20 minutes or more in a month to coordinate care. 4. Only one practitioner may furnish and bill the service in a calendar month. 5. The patient may stop CCM services at any time (effective at the end of the month) by phone call to the office staff. 6. The patient will be responsible for cost sharing (co-pay) of up to 20% of the service fee (after annual deductible is met).  Patient agreed to services and verbal consent obtained.     Rachel James verbalized understanding of the information discussed during the telephonic outreach today. Declined need for a mailed/printed copy of the instructions.   The care management team will follow-up with Rachel James within the next two months.   [facility] [phone]

## 2020-08-26 ENCOUNTER — Other Ambulatory Visit: Payer: Self-pay | Admitting: Surgery

## 2020-08-26 DIAGNOSIS — M19012 Primary osteoarthritis, left shoulder: Secondary | ICD-10-CM

## 2020-08-26 DIAGNOSIS — M7582 Other shoulder lesions, left shoulder: Secondary | ICD-10-CM

## 2020-08-28 ENCOUNTER — Other Ambulatory Visit: Payer: Self-pay | Admitting: Internal Medicine

## 2020-08-28 DIAGNOSIS — Z96651 Presence of right artificial knee joint: Secondary | ICD-10-CM | POA: Diagnosis not present

## 2020-08-28 DIAGNOSIS — Z1231 Encounter for screening mammogram for malignant neoplasm of breast: Secondary | ICD-10-CM

## 2020-09-07 ENCOUNTER — Other Ambulatory Visit: Payer: Self-pay | Admitting: Internal Medicine

## 2020-09-07 ENCOUNTER — Encounter: Payer: Self-pay | Admitting: Internal Medicine

## 2020-09-07 DIAGNOSIS — K219 Gastro-esophageal reflux disease without esophagitis: Secondary | ICD-10-CM

## 2020-09-08 ENCOUNTER — Other Ambulatory Visit: Payer: Self-pay

## 2020-09-08 DIAGNOSIS — E785 Hyperlipidemia, unspecified: Secondary | ICD-10-CM

## 2020-09-08 DIAGNOSIS — M5412 Radiculopathy, cervical region: Secondary | ICD-10-CM

## 2020-09-08 MED ORDER — TIZANIDINE HCL 4 MG PO TABS: 4.0000 mg | ORAL_TABLET | Freq: Three times a day (TID) | ORAL | 1 refills | Status: DC | PRN

## 2020-09-08 MED ORDER — SIMVASTATIN 10 MG PO TABS: 10.0000 mg | ORAL_TABLET | Freq: Every day | ORAL | 1 refills | Status: DC

## 2020-09-12 ENCOUNTER — Ambulatory Visit
Admission: RE | Admit: 2020-09-12 | Discharge: 2020-09-12 | Disposition: A | Discharge status: Home / Self Care | Payer: Medicare HMO | Source: Ambulatory Visit | Attending: Surgery | Admitting: Surgery

## 2020-09-12 ENCOUNTER — Other Ambulatory Visit: Payer: Self-pay

## 2020-09-12 DIAGNOSIS — M19012 Primary osteoarthritis, left shoulder: Secondary | ICD-10-CM | POA: Insufficient documentation

## 2020-09-12 DIAGNOSIS — M7582 Other shoulder lesions, left shoulder: Secondary | ICD-10-CM | POA: Insufficient documentation

## 2020-09-12 DIAGNOSIS — M7552 Bursitis of left shoulder: Secondary | ICD-10-CM | POA: Diagnosis not present

## 2020-09-12 DIAGNOSIS — M25412 Effusion, left shoulder: Secondary | ICD-10-CM | POA: Diagnosis not present

## 2020-10-02 ENCOUNTER — Ambulatory Visit: Payer: Self-pay

## 2020-10-05 ENCOUNTER — Encounter: Payer: Self-pay | Admitting: Internal Medicine

## 2020-10-06 ENCOUNTER — Ambulatory Visit: Payer: Medicare HMO

## 2020-10-09 ENCOUNTER — Encounter: Payer: Self-pay | Admitting: Internal Medicine

## 2020-10-12 ENCOUNTER — Ambulatory Visit: Payer: Medicare HMO | Admitting: Urology

## 2020-10-12 ENCOUNTER — Encounter: Payer: Medicare HMO | Admitting: Family Medicine

## 2020-10-19 ENCOUNTER — Ambulatory Visit: Payer: Medicare HMO | Admitting: Urology

## 2020-10-22 DIAGNOSIS — M6281 Muscle weakness (generalized): Secondary | ICD-10-CM | POA: Diagnosis not present

## 2020-10-22 DIAGNOSIS — R262 Difficulty in walking, not elsewhere classified: Secondary | ICD-10-CM | POA: Diagnosis not present

## 2020-10-22 DIAGNOSIS — Z96651 Presence of right artificial knee joint: Secondary | ICD-10-CM | POA: Diagnosis not present

## 2020-10-23 ENCOUNTER — Other Ambulatory Visit: Payer: Self-pay | Admitting: Surgery

## 2020-10-27 NOTE — Chronic Care Management (AMB) (Signed)
  Chronic Care Management   Note   Name: Rachel James MRN: [id number] DOB: 21-Nov-1946    Brief outreach with [name] regarding pending surgery. Reports shoulder surgery has been scheduled for 11/10/20. She has very good family support however anticipates family traveling out of town for the Thanksgiving holiday. She is hoping to recover in a skilled nursing facility immediately following the procedure. She is pending feedback from her insurance provider to determine coverage and out of pocket costs. Will consider rescheduling the surgery if post-surgical admission is not covered.   Follow up plan: Will follow up within the next few weeks to confirm plan and discuss post-surgical needs.     [facility] [phone]

## 2020-11-02 ENCOUNTER — Other Ambulatory Visit: Payer: Self-pay

## 2020-11-02 ENCOUNTER — Ambulatory Visit
Admission: RE | Admit: 2020-11-02 | Discharge: 2020-11-02 | Disposition: A | Discharge status: Home / Self Care | Payer: Medicare HMO | Source: Ambulatory Visit | Attending: Internal Medicine | Admitting: Internal Medicine

## 2020-11-02 ENCOUNTER — Encounter: Payer: Self-pay | Admitting: Internal Medicine

## 2020-11-02 DIAGNOSIS — I10 Essential (primary) hypertension: Secondary | ICD-10-CM

## 2020-11-02 DIAGNOSIS — Z1231 Encounter for screening mammogram for malignant neoplasm of breast: Secondary | ICD-10-CM | POA: Diagnosis not present

## 2020-11-02 DIAGNOSIS — Z96651 Presence of right artificial knee joint: Secondary | ICD-10-CM | POA: Diagnosis not present

## 2020-11-02 DIAGNOSIS — R262 Difficulty in walking, not elsewhere classified: Secondary | ICD-10-CM | POA: Diagnosis not present

## 2020-11-02 DIAGNOSIS — M6281 Muscle weakness (generalized): Secondary | ICD-10-CM | POA: Diagnosis not present

## 2020-11-02 DIAGNOSIS — R69 Illness, unspecified: Secondary | ICD-10-CM | POA: Diagnosis not present

## 2020-11-02 MED ORDER — METOPROLOL SUCCINATE ER 100 MG PO TB24: 100.0000 mg | ORAL_TABLET | Freq: Every day | ORAL | 1 refills | Status: DC

## 2020-11-09 ENCOUNTER — Other Ambulatory Visit: Payer: Self-pay

## 2020-11-09 ENCOUNTER — Other Ambulatory Visit
Admission: RE | Admit: 2020-11-09 | Discharge: 2020-11-09 | Disposition: A | Discharge status: Home / Self Care | Payer: Medicare HMO | Source: Ambulatory Visit | Attending: Surgery | Admitting: Surgery

## 2020-11-09 DIAGNOSIS — Z01810 Encounter for preprocedural cardiovascular examination: Secondary | ICD-10-CM | POA: Diagnosis not present

## 2020-11-09 DIAGNOSIS — Z01818 Encounter for other preprocedural examination: Secondary | ICD-10-CM | POA: Insufficient documentation

## 2020-11-09 DIAGNOSIS — R001 Bradycardia, unspecified: Secondary | ICD-10-CM | POA: Diagnosis not present

## 2020-11-09 LAB — CBC WITH DIFFERENTIAL/PLATELET
Abs Immature Granulocytes: 0.02 10*3/uL (ref 0.00–0.07)
Basophils Absolute: 0 10*3/uL (ref 0.0–0.1)
Basophils Relative: 1 %
Eosinophils Absolute: 0.2 10*3/uL (ref 0.0–0.5)
Eosinophils Relative: 4 %
HCT: 34.9 % — ABNORMAL LOW (ref 36.0–46.0)
Hemoglobin: 11.7 g/dL — ABNORMAL LOW (ref 12.0–15.0)
Immature Granulocytes: 0 %
Lymphocytes Relative: 20 %
Lymphs Abs: 1.1 10*3/uL (ref 0.7–4.0)
MCH: 29.5 pg (ref 26.0–34.0)
MCHC: 33.5 g/dL (ref 30.0–36.0)
MCV: 88.1 fL (ref 80.0–100.0)
Monocytes Absolute: 0.6 10*3/uL (ref 0.1–1.0)
Monocytes Relative: 12 %
Neutro Abs: 3.4 10*3/uL (ref 1.7–7.7)
Neutrophils Relative %: 63 %
Platelets: 238 10*3/uL (ref 150–400)
RBC: 3.96 MIL/uL (ref 3.87–5.11)
RDW: 14.3 % (ref 11.5–15.5)
WBC: 5.4 10*3/uL (ref 4.0–10.5)
nRBC: 0 % (ref 0.0–0.2)

## 2020-11-09 LAB — URINALYSIS, ROUTINE W REFLEX MICROSCOPIC
Bilirubin Urine: NEGATIVE
Glucose, UA: NEGATIVE mg/dL
Hgb urine dipstick: NEGATIVE
Ketones, ur: NEGATIVE mg/dL
Leukocytes,Ua: NEGATIVE
Nitrite: NEGATIVE
Protein, ur: NEGATIVE mg/dL
Specific Gravity, Urine: 1.018 (ref 1.005–1.030)
pH: 7 (ref 5.0–8.0)

## 2020-11-09 LAB — COMPREHENSIVE METABOLIC PANEL
ALT: 23 U/L (ref 0–44)
AST: 31 U/L (ref 15–41)
Albumin: 3.9 g/dL (ref 3.5–5.0)
Alkaline Phosphatase: 36 U/L — ABNORMAL LOW (ref 38–126)
Anion gap: 9 (ref 5–15)
BUN: 17 mg/dL (ref 8–23)
CO2: 28 mmol/L (ref 22–32)
Calcium: 9.7 mg/dL (ref 8.9–10.3)
Chloride: 98 mmol/L (ref 98–111)
Creatinine, Ser: 0.84 mg/dL (ref 0.44–1.00)
GFR, Estimated: >60 mL/min (ref >60)
Glucose, Bld: 86 mg/dL (ref 70–99)
Potassium: 4.4 mmol/L (ref 3.5–5.1)
Sodium: 135 mmol/L (ref 135–145)
Total Bilirubin: 0.9 mg/dL (ref 0.3–1.2)
Total Protein: 6.2 g/dL — ABNORMAL LOW (ref 6.5–8.1)

## 2020-11-09 LAB — SURGICAL PCR SCREEN
MRSA, PCR: NEGATIVE
Staphylococcus aureus: NEGATIVE

## 2020-11-09 LAB — TYPE AND SCREEN
ABO/RH(D): A POS
Antibody Screen: NEGATIVE

## 2020-11-09 NOTE — Patient Instructions (Addendum)
Your procedure is scheduled on:  Tuesday, November 30 Report to the Registration Desk on the 1st floor of the [facility]. To find out your arrival time, please call [phone] between 1PM - 3PM on: Monday, November 29  REMEMBER: Instructions that are not followed completely may result in serious medical risk, up to and including death; or upon the discretion of your surgeon and anesthesiologist your surgery may need to be rescheduled.  Do not eat food after midnight the night before surgery.  No gum chewing, lozengers or hard candies.  You may however, drink CLEAR liquids up to 2 hours before you are scheduled to arrive for your surgery. Do not drink anything within 2 hours of your scheduled arrival time.  Clear liquids include: - water  - apple juice without pulp - gatorade (not RED, PURPLE, OR BLUE) - black coffee or tea (Do NOT add milk or creamers to the coffee or tea) Do NOT drink anything that is not on this list.  In addition, your doctor has ordered for you to drink the provided  Ensure Pre-Surgery Clear Carbohydrate Drink  Drinking this carbohydrate drink up to two hours before surgery helps to reduce insulin resistance and improve patient outcomes. Please complete drinking 2 hours prior to scheduled arrival time.  TAKE THESE MEDICATIONS THE MORNING OF SURGERY WITH A SIP OF WATER:  1. Tylenol if needed for pain 2.  Famotidine (Pepcid)  - (take one the night before and one on the morning of surgery - helps to prevent nausea after surgery.) 3.  Metoprolol  One week prior to surgery starting November 23: Stop Anti-inflammatories (NSAIDS) such as Advil, Aleve, Ibuprofen, Motrin, Naproxen, Naprosyn and Aspirin based products such as Excedrin, Goodys Powder, BC Powder. Stop ANY OVER THE COUNTER supplements until after surgery. (However, you may continue taking Vitamin D, and multivitamin up until the day before surgery.)  On the morning of surgery brush your teeth with  toothpaste and water, you may rinse your mouth with mouthwash if you wish. Do not swallow any toothpaste or mouthwash.  Do not wear jewelry, make-up, hairpins, clips or nail polish.  Do not wear lotions, powders, or perfumes.   Do not shave body from the neck down 48 hours prior to surgery just in case you cut yourself which could leave a site for infection.  Also, freshly shaved skin may become irritated if using the CHG soap.  Do not bring valuables to the hospital. [facility] is not responsible for any missing/lost belongings or valuables.   Use CHG Soap as directed on instruction sheet.  Total Shoulder Arthroplasty:  use Benzolyl Peroxide 5% Gel as directed on instruction sheet.  Notify your doctor if there is any change in your medical condition (cold, fever, infection).  Wear comfortable clothing (specific to your surgery type) to the hospital.  Plan for stool softeners for home use; pain medications have a tendency to cause constipation. You can also help prevent constipation by eating foods high in fiber such as fruits and vegetables and drinking plenty of fluids as your diet allows.  After surgery, you can help prevent lung complications by doing breathing exercises.  Take deep breaths and cough every 1-2 hours. Your doctor may order a device called an Incentive Spirometer to help you take deep breaths.  If you are being admitted to the hospital overnight, leave your suitcase in the car. After surgery it may be brought to your room.  If you are being discharged the day of  surgery, you will not be allowed to drive home. You will need a responsible adult (18 years or older) to drive you home and stay with you that night.   If you are taking public transportation, you will need to have a responsible adult (18 years or older) with you. Please confirm with your physician that it is acceptable to use public transportation.   Please call the [facility] at [phone] if you have any questions about these instructions.  Visitation Policy:  Patients undergoing a surgery or procedure may have one family member or support person with them as long as that person is not COVID-19 positive or experiencing its symptoms.  That person may remain in the waiting area during the procedure.  Inpatient Visitation Update:   In an effort to ensure the safety of our team members and our patients, we are implementing a change to our visitation policy:  Effective Monday, Aug. 9, at 7 a.m., inpatients will be allowed one support person.  o The support person may change daily.  o The support person must pass our screening, gel in and out, and wear a mask at all times, including in the patient's room.  o Patients must also wear a mask when staff or their support person are in the room.  o Masking is required regardless of vaccination status.  Systemwide, no visitors 17 or younger.

## 2020-11-10 ENCOUNTER — Ambulatory Visit: Payer: Medicare HMO

## 2020-11-10 DIAGNOSIS — Z96651 Presence of right artificial knee joint: Secondary | ICD-10-CM | POA: Diagnosis not present

## 2020-11-10 DIAGNOSIS — M6281 Muscle weakness (generalized): Secondary | ICD-10-CM | POA: Diagnosis not present

## 2020-11-10 DIAGNOSIS — R262 Difficulty in walking, not elsewhere classified: Secondary | ICD-10-CM | POA: Diagnosis not present

## 2020-11-13 ENCOUNTER — Other Ambulatory Visit: Payer: Medicare HMO

## 2020-11-16 ENCOUNTER — Other Ambulatory Visit: Payer: Medicare HMO

## 2020-11-16 MED ORDER — CEFAZOLIN SODIUM-DEXTROSE 2-4 GM/100ML-% IV SOLN
2.0000 g | INTRAVENOUS | Status: AC
  Administered 2020-11-17: 2 g via INTRAVENOUS

## 2020-11-16 MED ORDER — LACTATED RINGERS IV SOLN: INTRAVENOUS | Status: DC

## 2020-11-16 MED ORDER — ORAL CARE MOUTH RINSE: 15.0000 mL | Freq: Once | OROMUCOSAL | Status: AC

## 2020-11-16 MED ORDER — CHLORHEXIDINE GLUCONATE 0.12 % MT SOLN: 15.0000 mL | Freq: Once | OROMUCOSAL | Status: AC

## 2020-11-17 ENCOUNTER — Ambulatory Visit: Payer: Medicare HMO

## 2020-11-17 ENCOUNTER — Encounter
Admission: RE | Disposition: A | Discharge status: Home / Self Care | Payer: Self-pay | Source: Home / Self Care | Attending: Surgery

## 2020-11-17 ENCOUNTER — Ambulatory Visit
Admission: RE | Admit: 2020-11-17 | Discharge: 2020-11-18 | Disposition: A | Discharge status: Home / Self Care | Payer: Medicare HMO | Attending: Surgery | Admitting: Surgery

## 2020-11-17 ENCOUNTER — Encounter: Payer: Self-pay | Admitting: Surgery

## 2020-11-17 ENCOUNTER — Ambulatory Visit: Payer: Medicare HMO | Admitting: Certified Registered Nurse Anesthetist

## 2020-11-17 ENCOUNTER — Other Ambulatory Visit: Payer: Self-pay

## 2020-11-17 DIAGNOSIS — Z471 Aftercare following joint replacement surgery: Secondary | ICD-10-CM | POA: Diagnosis not present

## 2020-11-17 DIAGNOSIS — M75112 Incomplete rotator cuff tear or rupture of left shoulder, not specified as traumatic: Secondary | ICD-10-CM | POA: Diagnosis not present

## 2020-11-17 DIAGNOSIS — Z96652 Presence of left artificial knee joint: Secondary | ICD-10-CM

## 2020-11-17 DIAGNOSIS — Z96612 Presence of left artificial shoulder joint: Secondary | ICD-10-CM | POA: Diagnosis not present

## 2020-11-17 DIAGNOSIS — Z885 Allergy status to narcotic agent status: Secondary | ICD-10-CM | POA: Insufficient documentation

## 2020-11-17 DIAGNOSIS — M25512 Pain in left shoulder: Secondary | ICD-10-CM | POA: Diagnosis not present

## 2020-11-17 DIAGNOSIS — Z79899 Other long term (current) drug therapy: Secondary | ICD-10-CM | POA: Diagnosis not present

## 2020-11-17 DIAGNOSIS — I700 Atherosclerosis of aorta: Secondary | ICD-10-CM | POA: Diagnosis not present

## 2020-11-17 DIAGNOSIS — M7582 Other shoulder lesions, left shoulder: Secondary | ICD-10-CM | POA: Diagnosis not present

## 2020-11-17 DIAGNOSIS — Z888 Allergy status to other drugs, medicaments and biological substances status: Secondary | ICD-10-CM | POA: Insufficient documentation

## 2020-11-17 DIAGNOSIS — G8918 Other acute postprocedural pain: Secondary | ICD-10-CM | POA: Diagnosis not present

## 2020-11-17 DIAGNOSIS — M19012 Primary osteoarthritis, left shoulder: Secondary | ICD-10-CM | POA: Insufficient documentation

## 2020-11-17 HISTORY — PX: REVERSE SHOULDER ARTHROPLASTY: SHX5054

## 2020-11-17 SURGERY — ARTHROPLASTY, SHOULDER, TOTAL, REVERSE
Anesthesia: General | Site: Shoulder | Laterality: Left

## 2020-11-17 MED ORDER — BUPIVACAINE LIPOSOME 1.3 % IJ SUSP
INTRAMUSCULAR | Status: AC
  Filled 2020-11-17: qty 20

## 2020-11-17 MED ORDER — CHLORHEXIDINE GLUCONATE 0.12 % MT SOLN
OROMUCOSAL | Status: AC
  Administered 2020-11-17: 15 mL via OROMUCOSAL
  Filled 2020-11-17: qty 15

## 2020-11-17 MED ORDER — PHENYLEPHRINE HCL (PRESSORS) 10 MG/ML IV SOLN
INTRAVENOUS | Status: DC | PRN
  Administered 2020-11-17: 100 ug via INTRAVENOUS
  Administered 2020-11-17: 50 ug via INTRAVENOUS

## 2020-11-17 MED ORDER — BUPIVACAINE-EPINEPHRINE (PF) 0.5% -1:200000 IJ SOLN
INTRAMUSCULAR | Status: AC
  Filled 2020-11-17: qty 30

## 2020-11-17 MED ORDER — ONDANSETRON HCL 4 MG/2ML IJ SOLN
INTRAMUSCULAR | Status: DC | PRN
  Administered 2020-11-17: 4 mg via INTRAVENOUS

## 2020-11-17 MED ORDER — TRANEXAMIC ACID 1000 MG/10ML IV SOLN
INTRAVENOUS | Status: DC | PRN
  Administered 2020-11-17: 1000 mg via TOPICAL

## 2020-11-17 MED ORDER — KETOROLAC TROMETHAMINE 15 MG/ML IJ SOLN
INTRAMUSCULAR | Status: AC
  Administered 2020-11-17: 7.5 mg via INTRAVENOUS
  Filled 2020-11-17: qty 1

## 2020-11-17 MED ORDER — OXYMETAZOLINE HCL 0.05 % NA SOLN: 1.0000 | Freq: Every day | NASAL | Status: DC | PRN

## 2020-11-17 MED ORDER — SODIUM CHLORIDE 0.9 % IV SOLN: INTRAVENOUS | Status: DC

## 2020-11-17 MED ORDER — FENTANYL CITRATE (PF) 100 MCG/2ML IJ SOLN: 50.0000 ug | Freq: Once | INTRAMUSCULAR | Status: AC

## 2020-11-17 MED ORDER — CEFAZOLIN SODIUM-DEXTROSE 2-4 GM/100ML-% IV SOLN
INTRAVENOUS | Status: AC
  Filled 2020-11-17: qty 100

## 2020-11-17 MED ORDER — METOCLOPRAMIDE HCL 10 MG PO TABS: 5.0000 mg | ORAL_TABLET | Freq: Three times a day (TID) | ORAL | Status: DC | PRN

## 2020-11-17 MED ORDER — ONDANSETRON HCL 4 MG PO TABS: 4.0000 mg | ORAL_TABLET | Freq: Four times a day (QID) | ORAL | Status: DC | PRN

## 2020-11-17 MED ORDER — MORPHINE SULFATE (PF) 4 MG/ML IV SOLN: 0.5000 mg | INTRAVENOUS | Status: DC | PRN

## 2020-11-17 MED ORDER — LIDOCAINE HCL (PF) 1 % IJ SOLN
INTRAMUSCULAR | Status: AC
  Filled 2020-11-17: qty 5

## 2020-11-17 MED ORDER — ONDANSETRON HCL 4 MG/2ML IJ SOLN: 4.0000 mg | Freq: Once | INTRAMUSCULAR | Status: DC | PRN

## 2020-11-17 MED ORDER — ROCURONIUM BROMIDE 100 MG/10ML IV SOLN
INTRAVENOUS | Status: DC | PRN
  Administered 2020-11-17: 10 mg via INTRAVENOUS
  Administered 2020-11-17: 20 mg via INTRAVENOUS
  Administered 2020-11-17: 50 mg via INTRAVENOUS

## 2020-11-17 MED ORDER — BUPIVACAINE HCL (PF) 0.5 % IJ SOLN
INTRAMUSCULAR | Status: AC
  Filled 2020-11-17: qty 10

## 2020-11-17 MED ORDER — SIMVASTATIN 10 MG PO TABS
10.0000 mg | ORAL_TABLET | Freq: Every day | ORAL | Status: DC
  Administered 2020-11-17: 10 mg via ORAL
  Filled 2020-11-17 (×2): qty 1

## 2020-11-17 MED ORDER — BUPIVACAINE-EPINEPHRINE 0.5% -1:200000 IJ SOLN
INTRAMUSCULAR | Status: DC | PRN
  Administered 2020-11-17: 30 mL

## 2020-11-17 MED ORDER — TRANEXAMIC ACID 1000 MG/10ML IV SOLN
INTRAVENOUS | Status: AC
  Filled 2020-11-17: qty 10

## 2020-11-17 MED ORDER — METOCLOPRAMIDE HCL 5 MG/ML IJ SOLN: 5.0000 mg | Freq: Three times a day (TID) | INTRAMUSCULAR | Status: DC | PRN

## 2020-11-17 MED ORDER — ENOXAPARIN SODIUM 40 MG/0.4ML ~~LOC~~ SOLN
40.0000 mg | SUBCUTANEOUS | Status: DC
  Administered 2020-11-18: 40 mg via SUBCUTANEOUS

## 2020-11-17 MED ORDER — ACETAMINOPHEN 325 MG PO TABS: 325.0000 mg | ORAL_TABLET | Freq: Four times a day (QID) | ORAL | Status: DC | PRN

## 2020-11-17 MED ORDER — LIDOCAINE HCL (PF) 1 % IJ SOLN
INTRAMUSCULAR | Status: DC | PRN
  Administered 2020-11-17: 1 mL

## 2020-11-17 MED ORDER — PROPOFOL 10 MG/ML IV BOLUS
INTRAVENOUS | Status: DC | PRN
  Administered 2020-11-17: 100 mg via INTRAVENOUS

## 2020-11-17 MED ORDER — METOPROLOL TARTRATE 5 MG/5ML IV SOLN
INTRAVENOUS | Status: DC | PRN
  Administered 2020-11-17 (×2): 1 mg via INTRAVENOUS

## 2020-11-17 MED ORDER — FENTANYL CITRATE (PF) 100 MCG/2ML IJ SOLN
INTRAMUSCULAR | Status: AC
  Administered 2020-11-17: 50 ug via INTRAVENOUS
  Filled 2020-11-17: qty 2

## 2020-11-17 MED ORDER — MAGNESIUM OXIDE 400 (241.3 MG) MG PO TABS
400.0000 mg | ORAL_TABLET | Freq: Every day | ORAL | Status: DC
  Administered 2020-11-17: 400 mg via ORAL
  Filled 2020-11-17 (×2): qty 1

## 2020-11-17 MED ORDER — DEXAMETHASONE SODIUM PHOSPHATE 10 MG/ML IJ SOLN
INTRAMUSCULAR | Status: DC | PRN
  Administered 2020-11-17: 5 mg via INTRAVENOUS

## 2020-11-17 MED ORDER — CEFAZOLIN SODIUM-DEXTROSE 2-4 GM/100ML-% IV SOLN
INTRAVENOUS | Status: AC
  Administered 2020-11-17: 2 g via INTRAVENOUS
  Filled 2020-11-17: qty 100

## 2020-11-17 MED ORDER — HYDROCHLOROTHIAZIDE 12.5 MG PO CAPS
12.5000 mg | ORAL_CAPSULE | Freq: Every day | ORAL | Status: DC
  Administered 2020-11-17 – 2020-11-18 (×2): 12.5 mg via ORAL
  Filled 2020-11-17 (×2): qty 1

## 2020-11-17 MED ORDER — BUPIVACAINE LIPOSOME 1.3 % IJ SUSP
INTRAMUSCULAR | Status: DC | PRN
  Administered 2020-11-17 (×4): 5 mL

## 2020-11-17 MED ORDER — BISACODYL 10 MG RE SUPP
10.0000 mg | Freq: Every day | RECTAL | Status: DC | PRN
  Filled 2020-11-17: qty 1

## 2020-11-17 MED ORDER — ACETAMINOPHEN 500 MG PO TABS
ORAL_TABLET | ORAL | Status: AC
  Administered 2020-11-17: 500 mg via ORAL
  Filled 2020-11-17: qty 1

## 2020-11-17 MED ORDER — FLEET ENEMA 7-19 GM/118ML RE ENEM: 1.0000 | ENEMA | Freq: Once | RECTAL | Status: DC | PRN

## 2020-11-17 MED ORDER — KETOROLAC TROMETHAMINE 15 MG/ML IJ SOLN
INTRAMUSCULAR | Status: AC
  Administered 2020-11-17: 15 mg via INTRAVENOUS
  Filled 2020-11-17: qty 1

## 2020-11-17 MED ORDER — KETOROLAC TROMETHAMINE 15 MG/ML IJ SOLN: 7.5000 mg | Freq: Four times a day (QID) | INTRAMUSCULAR | Status: DC

## 2020-11-17 MED ORDER — DIPHENHYDRAMINE HCL 12.5 MG/5ML PO ELIX
12.5000 mg | ORAL_SOLUTION | ORAL | Status: DC | PRN
  Filled 2020-11-17: qty 10

## 2020-11-17 MED ORDER — CEFAZOLIN SODIUM-DEXTROSE 2-4 GM/100ML-% IV SOLN: 2.0000 g | Freq: Four times a day (QID) | INTRAVENOUS | Status: AC

## 2020-11-17 MED ORDER — ONDANSETRON HCL 4 MG/2ML IJ SOLN: 4.0000 mg | Freq: Four times a day (QID) | INTRAMUSCULAR | Status: DC | PRN

## 2020-11-17 MED ORDER — KETOROLAC TROMETHAMINE 15 MG/ML IJ SOLN
INTRAMUSCULAR | Status: AC
  Filled 2020-11-17: qty 1

## 2020-11-17 MED ORDER — FENTANYL CITRATE (PF) 100 MCG/2ML IJ SOLN: 25.0000 ug | INTRAMUSCULAR | Status: DC | PRN

## 2020-11-17 MED ORDER — KETOROLAC TROMETHAMINE 30 MG/ML IJ SOLN
7.5000 mg | Freq: Four times a day (QID) | INTRAMUSCULAR | Status: DC
  Administered 2020-11-18: 7.5 mg via INTRAVENOUS

## 2020-11-17 MED ORDER — KETOROLAC TROMETHAMINE 15 MG/ML IJ SOLN: 15.0000 mg | Freq: Once | INTRAMUSCULAR | Status: AC

## 2020-11-17 MED ORDER — HYDROCODONE-ACETAMINOPHEN 5-325 MG PO TABS: 1.0000 | ORAL_TABLET | ORAL | Status: DC | PRN

## 2020-11-17 MED ORDER — PROPOFOL 10 MG/ML IV BOLUS
INTRAVENOUS | Status: AC
  Filled 2020-11-17: qty 40

## 2020-11-17 MED ORDER — LIDOCAINE HCL (CARDIAC) PF 100 MG/5ML IV SOSY
PREFILLED_SYRINGE | INTRAVENOUS | Status: DC | PRN
  Administered 2020-11-17: 50 mg via INTRAVENOUS

## 2020-11-17 MED ORDER — SUGAMMADEX SODIUM 500 MG/5ML IV SOLN
INTRAVENOUS | Status: DC | PRN
  Administered 2020-11-17: 200 mg via INTRAVENOUS

## 2020-11-17 MED ORDER — MIDAZOLAM HCL 2 MG/2ML IJ SOLN: 1.0000 mg | Freq: Once | INTRAMUSCULAR | Status: AC

## 2020-11-17 MED ORDER — BUPIVACAINE HCL (PF) 0.5 % IJ SOLN
INTRAMUSCULAR | Status: DC | PRN
  Administered 2020-11-17 (×2): 5 mL

## 2020-11-17 MED ORDER — SODIUM CHLORIDE 0.9 % IV SOLN
INTRAVENOUS | Status: DC | PRN
  Administered 2020-11-17: 30 ug/min via INTRAVENOUS

## 2020-11-17 MED ORDER — METOPROLOL SUCCINATE ER 25 MG PO TB24
100.0000 mg | ORAL_TABLET | Freq: Every day | ORAL | Status: DC
  Administered 2020-11-18: 100 mg via ORAL
  Filled 2020-11-17: qty 4

## 2020-11-17 MED ORDER — FLUTICASONE PROPIONATE 50 MCG/ACT NA SUSP
1.0000 | Freq: Every day | NASAL | Status: DC | PRN
  Filled 2020-11-17: qty 16

## 2020-11-17 MED ORDER — ACETAMINOPHEN 500 MG PO TABS: 500.0000 mg | ORAL_TABLET | Freq: Four times a day (QID) | ORAL | Status: AC

## 2020-11-17 MED ORDER — MAGNESIUM HYDROXIDE 400 MG/5ML PO SUSP: 30.0000 mL | Freq: Every day | ORAL | Status: DC | PRN

## 2020-11-17 MED ORDER — LOSARTAN POTASSIUM 50 MG PO TABS
100.0000 mg | ORAL_TABLET | Freq: Every day | ORAL | Status: DC
  Administered 2020-11-17 – 2020-11-18 (×2): 100 mg via ORAL
  Filled 2020-11-17 (×2): qty 2

## 2020-11-17 MED ORDER — PANTOPRAZOLE SODIUM 40 MG PO TBEC
40.0000 mg | DELAYED_RELEASE_TABLET | Freq: Every day | ORAL | Status: DC
  Administered 2020-11-17 – 2020-11-18 (×2): 40 mg via ORAL
  Filled 2020-11-17 (×2): qty 1

## 2020-11-17 MED ORDER — TIZANIDINE HCL 4 MG PO TABS
4.0000 mg | ORAL_TABLET | Freq: Three times a day (TID) | ORAL | Status: DC | PRN
  Filled 2020-11-17: qty 1

## 2020-11-17 MED ORDER — SODIUM CHLORIDE (PF) 0.9 % IJ SOLN
INTRAMUSCULAR | Status: AC
  Filled 2020-11-17: qty 40

## 2020-11-17 MED ORDER — DOCUSATE SODIUM 100 MG PO CAPS
100.0000 mg | ORAL_CAPSULE | Freq: Two times a day (BID) | ORAL | Status: DC
  Administered 2020-11-17 – 2020-11-18 (×2): 100 mg via ORAL
  Filled 2020-11-17 (×3): qty 1

## 2020-11-17 MED ORDER — ADULT MULTIVITAMIN W/MINERALS CH
1.0000 | ORAL_TABLET | Freq: Every day | ORAL | Status: DC
  Administered 2020-11-18: 1 via ORAL
  Filled 2020-11-17: qty 1

## 2020-11-17 MED ORDER — MIDAZOLAM HCL 2 MG/2ML IJ SOLN
INTRAMUSCULAR | Status: AC
  Administered 2020-11-17: 1 mg via INTRAVENOUS
  Filled 2020-11-17: qty 2

## 2020-11-17 SURGICAL SUPPLY — 70 items
BASEPLATE BOSS DRILL (MISCELLANEOUS) ×2 IMPLANT
BIT DRILL 2.5 (BIT) ×1
BIT DRILL 2.5X4.5XSCR (BIT) ×1 IMPLANT
BIT DRILL F/BASEPLATE CENTRAL (BIT) ×1 IMPLANT
BIT DRL 2.5X4.5XSCR (BIT) ×1
BLADE SAW SAG 25X90X1.19 (BLADE) ×2 IMPLANT
CANISTER SUCT 1200ML W/VALVE (MISCELLANEOUS) ×2 IMPLANT
CANISTER SUCT 3000ML PPV (MISCELLANEOUS) ×4 IMPLANT
CHLORAPREP W/TINT 26 (MISCELLANEOUS) ×2 IMPLANT
COOLER POLAR GLACIER W/PUMP (MISCELLANEOUS) ×2 IMPLANT
COVER BACK TABLE REUSABLE LG (DRAPES) ×2 IMPLANT
COVER WAND RF STERILE (DRAPES) ×2 IMPLANT
DRAPE 3/4 80X56 (DRAPES) ×4 IMPLANT
DRAPE IMP U-DRAPE 54X76 (DRAPES) ×4 IMPLANT
DRAPE INCISE IOBAN 66X45 STRL (DRAPES) ×4 IMPLANT
DRILL BASEPLATE CENTRAL  S (BIT) ×1
DRILL BASEPLATE CENTRAL S (BIT) ×1
DRSG OPSITE POSTOP 4X8 (GAUZE/BANDAGES/DRESSINGS) ×2 IMPLANT
ELECT CAUTERY BLADE 6.4 (BLADE) ×2 IMPLANT
GLENOSPHERE RSS 2 CONCENTRIC (Shoulder) ×2 IMPLANT
GLOVE BIO SURGEON STRL SZ7.5 (GLOVE) ×8 IMPLANT
GLOVE BIO SURGEON STRL SZ8 (GLOVE) ×8 IMPLANT
GLOVE BIOGEL PI IND STRL 8 (GLOVE) ×1 IMPLANT
GLOVE BIOGEL PI INDICATOR 8 (GLOVE) ×1
GLOVE INDICATOR 8.0 STRL GRN (GLOVE) ×2 IMPLANT
GOWN STRL REUS W/ TWL LRG LVL3 (GOWN DISPOSABLE) ×1 IMPLANT
GOWN STRL REUS W/ TWL XL LVL3 (GOWN DISPOSABLE) ×1 IMPLANT
GOWN STRL REUS W/TWL LRG LVL3 (GOWN DISPOSABLE) ×1
GOWN STRL REUS W/TWL XL LVL3 (GOWN DISPOSABLE) ×1
GUIDE PIN 2.0 S150MM (PIN) ×2 IMPLANT
HOOD PEEL AWAY FLYTE STAYCOOL (MISCELLANEOUS) ×8 IMPLANT
ILLUMINATOR WAVEGUIDE N/F (MISCELLANEOUS) ×2 IMPLANT
KIT STABILIZATION SHOULDER (MISCELLANEOUS) ×2 IMPLANT
KIT TURNOVER KIT A (KITS) ×2 IMPLANT
LINER STD +6S RSS HXL (Liner) ×2 IMPLANT
MANIFOLD NEPTUNE II (INSTRUMENTS) ×2 IMPLANT
MASK FACE SPIDER DISP (MASK) ×2 IMPLANT
MAT ABSORB  FLUID 56X50 GRAY (MISCELLANEOUS) ×1
MAT ABSORB FLUID 56X50 GRAY (MISCELLANEOUS) ×1 IMPLANT
NDL SAFETY ECLIPSE 18X1.5 (NEEDLE) ×1 IMPLANT
NEEDLE HYPO 18GX1.5 SHARP (NEEDLE) ×1
NEEDLE HYPO 22GX1.5 SAFETY (NEEDLE) ×2 IMPLANT
NEEDLE SPNL 20GX3.5 QUINCKE YW (NEEDLE) ×2 IMPLANT
NS IRRIG 500ML POUR BTL (IV SOLUTION) ×2 IMPLANT
PACK ARTHROSCOPY SHOULDER (MISCELLANEOUS) ×2 IMPLANT
PAD ARMBOARD 7.5X6 YLW CONV (MISCELLANEOUS) ×2 IMPLANT
PAD WRAPON POLAR SHDR UNIV (MISCELLANEOUS) ×1 IMPLANT
PENCIL SMOKE EVACUATOR (MISCELLANEOUS) ×2 IMPLANT
PLATE BASE REVERSE RSS S (Plate) ×2 IMPLANT
PULSAVAC PLUS IRRIG FAN TIP (DISPOSABLE) ×2
SCREW 4.5X15 RSS W CAP (Screw) ×6 IMPLANT
SCREW 4.5X20 RSS W CAP (Screw) ×2 IMPLANT
SCREW 4.5X25 RSS W CAP (Screw) ×2 IMPLANT
SCREW BODY REVERSE STD (Screw) ×2 IMPLANT
SLING ULTRA II M (MISCELLANEOUS) ×2 IMPLANT
SOL .9 NS 3000ML IRR  AL (IV SOLUTION) ×1
SOL .9 NS 3000ML IRR UROMATIC (IV SOLUTION) ×1 IMPLANT
SPONGE LAP 18X18 RF (DISPOSABLE) ×2 IMPLANT
STAPLER SKIN PROX 35W (STAPLE) ×2 IMPLANT
STEM PRESS FIT SZ14 TSS LF (Stem) ×2 IMPLANT
SUT ETHIBOND 0 MO6 C/R (SUTURE) ×2 IMPLANT
SUT FIBERWIRE #2 38 BLUE 1/2 (SUTURE) ×6
SUT VIC AB 0 CT1 36 (SUTURE) ×2 IMPLANT
SUT VIC AB 2-0 CT1 27 (SUTURE) ×2
SUT VIC AB 2-0 CT1 TAPERPNT 27 (SUTURE) ×2 IMPLANT
SUTURE FIBERWR #2 38 BLUE 1/2 (SUTURE) ×3 IMPLANT
SYR 10ML LL (SYRINGE) ×2 IMPLANT
SYR 30ML LL (SYRINGE) ×2 IMPLANT
TIP FAN IRRIG PULSAVAC PLUS (DISPOSABLE) ×1 IMPLANT
WRAPON POLAR PAD SHDR UNIV (MISCELLANEOUS) ×2

## 2020-11-17 NOTE — H&P (Signed)
History of Present Illness:  Rachel James is a 74 y.o. female who presents today as a result of a call to our clinic for right shoulder pain.   The patient's symptoms began several years ago and developed without any specific cause or injury. 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 While in [place], she was told that she had arthritis and would need a shoulder replacement. She received a steroid injection into the left shoulder in March which she states provided moderate relief of her symptoms for over 2 months before her symptoms began to recur. The patient describes the symptoms as moderate (patient is active but has had to make modifications or give up activities) and have the quality of being aching, miserable, nagging, stabbing, tender and throbbing. The pain is localized to the lateral arm/shoulder and localized to the anterior shoulder. These symptoms are aggravated with normal daily activities, with sleeping, at higher levels of activity, with overhead activity and reaching behind the back. She has tried acetaminophen and non-steroidal anti-inflammatories (Aleve ) with temporary partial relief. She has tried rest with limited benefit. She has tried the 1 injection described above, but has not tried any formal therapy for these symptoms. She denies any neck pain, nor does she note any numbness or paresthesias down her arm to her hand.  This complaint is not work related. She is a sports non-participant.  Shoulder Surgical History:  The patient has had a right shoulder arthroscopy with debridement, decompression, and mini-open biceps tenodesis in 2019 followed by a right total shoulder arthroplasty in August, 2019. However, she has had no prior surgeries on her left shoulder.  PMH/PSH/Family History/Social History/Meds/Allergies:  I have reviewed past medical, surgical, social and family history, medications and allergies as documented in the EMR.  Current Outpatient Medications: . acetaminophen  (TYLENOL) 500 MG tablet Take 500 mg by mouth every 6 (six) hours as needed for Pain.  Marland Kitchen ALPRAZolam (XANAX) 0.25 MG tablet Take 1 tablet 30 minutes prior to procedure. May take 1 tablet immediately before procedure if necessary. 2 tablet 0  . diphenhydrAMINE (BENADRYL) 25 mg capsule Take 25 mg by mouth every 6 (six) hours as needed for Itching  . docusate sodium (STOOL SOFTENER ORAL) Take 1 tablet by mouth once daily  . famotidine (PEPCID) 40 MG tablet Take 40 mg by mouth once daily  . fluticasone propionate (FLONASE) 50 mcg/actuation nasal spray by Nasal route  . hydroCHLOROthiazide (MICROZIDE) 12.5 mg capsule Take 12.5 mg by mouth once daily  . losartan (COZAAR) 100 MG tablet Take 100 mg by mouth once daily  . lysine 500 mg Tab Take by mouth as needed  . magnesium oxide 500 mg Tab Take 1 tablet by mouth once daily  . metoprolol succinate (TOPROL-XL) 100 MG XL tablet Take 100 mg by mouth once daily  . multivitamin-lutein (MULTIVITAMIN 50 PLUS) tablet Take 1 tablet by mouth once daily  . naproxen sodium (ALEVE) 220 MG tablet Take 440 mg by mouth 2 (two) times daily as needed  . simvastatin (ZOCOR) 10 MG tablet Take 1 tablet by mouth once daily  . tiZANidine (ZANAFLEX) 4 MG tablet 4 mg every 8 (eight) hours as needed  . amoxicillin (AMOXIL) 500 MG capsule Take 500 mg by mouth BEFORE DENTAL APPOINTMENT (Patient not taking: Reported on 11/10/2020 )  . aspirin 81 MG EC tablet Take 81 mg by mouth once daily (Patient not taking: Reported on 11/10/2020 )  . carbamide peroxide (DEBROX) 6.5 % otic solution 5 drops 2 (two)  times daily as needed. (Patient not taking: Reported on 11/10/2020)  . cholecalciferol (VITAMIN D3) 1,000 unit capsule Take 1,000 Units by mouth once daily. (Patient not taking: Reported on 11/10/2020 )  . turmeric 400 mg Cap Take by mouth 800 mg cap (Patient not taking: Reported on 11/10/2020 )   Allergies:  . Tramadol Other (See Comments)  Per patient this medication. she thinks  lowers her blood pressure. She says that she becomes very lightheaded when on this medication.  . Codeine Itching and Nightmares  . Oxycodone Itching and Other (See Comments)  (if taken for long period of time)  Past Medical History:  . Anxiety  . DDD (degenerative disc disease), lumbar  . Diverticulitis of colon  . GERD (gastroesophageal reflux disease)  . Hip osteoarthritis  . Hyperlipidemia  . Hypertension  . Lumbar pain  . Lumbar spinal stenosis   Past Surgical History:  . ACDF 09/18/2012  C4-5, C6-7  . APPENDECTOMY  . APPENDECTOMY  . Bunionectomies Bilateral  . Extensive arthroscopic debridment,arthroscopic SLAP repair,subacromial decompression and mini-open biceps tenodesis,right shoulder Right 12/20/2017 Dr.Jennilee Demarco  . Left total hip arthroplasty 06/10/2020 Dr Marry Guan  . Right total knee arthroplasty using computer-assisted navigation 11/04/2019 Dr Marry Guan  . Right total shoulder arthroplasty 07/2018 Dr. Onnie Graham  . S/P Breast Biopsy Right  . S/P Tubal Ligation   Family History:  . Heart failure Brother   Social History:   Socioeconomic History:  Marland Kitchen Marital status: Divorced  Spouse name: Not on file  . Number of children: 3  . Years of education: 54  . Highest education level: Not on file  Occupational History  . Occupation: Retired Nurse, learning disability  Tobacco Use  . Smoking status: Never Smoker  . Smokeless tobacco: Never Used  Substance and Sexual Activity  . Alcohol use: Not Currently  Alcohol/week: 1.0 standard drink  Types: 1 Glasses of wine per week  Comment: occassional  . Drug use: Never  . Sexual activity: Defer  Partners: Male  Other Topics Concern  . Not on file  Social History Narrative  . Not on file   Social Determinants of Health:   Financial Resource Strain: Not on file  Food Insecurity: Not on file  Transportation Needs: Not on file   Review of Systems:  A comprehensive 14 point ROS was performed, reviewed, and the pertinent orthopaedic  findings are documented in the HPI.  Physical Exam:  Vitals:  11/10/20 1035  BP: 161/79  Pulse: 75  Weight: 60.2 kg (132 lb 12.8 oz)  Height: 160 cm (5\' 3" )  PainSc: 2  PainLoc: Shoulder   General/Constitutional: The patient appears to be well-nourished, well-developed, and in no acute distress. Neuro/Psych: Normal mood and affect, oriented to person, place and time. Eyes: Non-icteric. Pupils are equal, round, and reactive to light, and exhibit synchronous movement. ENT: Unremarkable. Lymphatic: No palpable adenopathy. Respiratory: No wheezes and Non-labored breathing Cardiovascular: No edema, swelling or tenderness, except as noted in detailed exam. Integumentary: No impressive skin lesions present, except as noted in detailed exam. Musculoskeletal: Unremarkable, except as noted in detailed exam.  Left shoulder exam: SKIN: normal SWELLING: minimal WARMTH: none LYMPH NODES: no adenopathy palpable CREPITUS: Minimal intermittent glenohumeral crepitance TENDERNESS: Mildly tender over anterolateral shoulder ROM (active):  Forward flexion: 145 degrees Abduction: 135 Internal rotation: L2 ROM (passive):  Forward flexion: 155 degrees Abduction: 145 degrees  ER/IR at 90 abd: 80 degrees / 60 degrees  She has mild discomfort at the extremes of all motions.  STRENGTH: Forward flexion:  4-4+/5 Abduction: 4-4+/5 External rotation: 4-4+/5 Internal rotation: 4+/5 Pain with RC testing: Minimal discomfort with resisted abduction  STABILITY: Normal  SPECIAL TESTS: Luan Pulling' test: positive, mild Speed's test: negative Capsulitis - pain w/ passive ER: no Crossed arm test: Mildly positive Crank: Not evaluated Anterior apprehension: Negative Posterior apprehension: Not evaluated  She is neurovascularly intact to the left upper extremity.  Imaging:  Shoulder X-Ray Imaging: Recent true AP, Y-scapular, and axillary views of the left shoulder are available for review. These films  demonstrate moderate degenerative changes with an inferior humeral osteophyte and joint space narrowing. The subacromial space is well-maintained. There is no subacromial or infra-clavicular spurring. She demonstrates a Type I-II acromion.  MRI Results: MRI of the left shoulder was completed on September 12, 2020. The MRI revealed moderate rotator cuff tendinopathy with interstitial tears but no full-thickness tear. There is severe glenohumeral degenerative joint changes with degenerative glenoid labrum.   Assessment:  Primary osteoarthritis of left shoulder.   Plan:  The treatment options were discussed with the patient. In addition, patient educational materials were provided regarding the diagnosis and treatment options. The patient is quite frustrated by her symptoms and functional limitations, and is ready to consider more aggressive treatment options. Therefore, I have recommended a surgical procedure, specifically either a standard or reverse total shoulder arthroplasty, depending on the quality of her rotator cuff. The procedure was discussed with the patient, as were the potential risks (including bleeding, infection, nerve and/or blood vessel injury, persistent or recurrent pain, loosening and/or failure of the components, dislocation, leg length inequality, need for further surgery, blood clots, strokes, heart attacks and/or arhythmias, pneumonia, etc.) and benefits. The patient states his/her understanding and wishes to proceed. All of the patient's questions and concerns were answered. She can call any time with further concerns. She will follow up post-surgery, routine. I reviewed the risks, benefits, and alternatives to the surgery. The patient seems understand and wants to set up for November 17, 2020.    H&P reviewed and patient re-examined. No changes.

## 2020-11-17 NOTE — Anesthesia Procedure Notes (Signed)
Anesthesia Regional Block: Interscalene brachial plexus block   Pre-Anesthetic Checklist: ,, timeout performed, Correct Patient, Correct Site, Correct Laterality, Correct Procedure, Correct Position, site marked, Risks and benefits discussed,  Surgical consent,  Pre-op evaluation,  At surgeon's request and post-op pain management  Laterality: Left  Prep: chloraprep       Needles:  Injection technique: Single-shot     Needle Length: 9cm  Needle Gauge: 21     Additional Needles:   Procedures:,,,, ultrasound used (permanent image in chart),,,,  Narrative:  Start time: 11/17/2020 9:59 AM End time: 11/17/2020 10:02 AM  Performed by: Personally  Anesthesiologist: Tera Mater, MD  Additional Notes: Risks and benefits of nerve block discussed with patient, including but not limited to risk of nerve injury, bleeding, infection, and failed block.  Patient expressed understanding and consented to block placement.   Functioning IV was confirmed and monitors were applied.  Sterile prep,hand hygiene and sterile gloves were used.  Minimal sedation used for procedure.  During the procedure, there was negative aspiration, negative paresthesia on injection, and dose was given in divided aliquots under ultrasound guidance.  Patient tolerated the procedure well with no immediate complications.

## 2020-11-17 NOTE — Transfer of Care (Signed)
Immediate Anesthesia Transfer of Care Note  Patient: Rachel James  Procedure(s) Performed: REVERSE SHOULDER ARTHROPLASTY (Left Shoulder)  Patient Location: PACU  Anesthesia Type:General and Regional  Level of Consciousness: awake and alert   Airway & Oxygen Therapy: Patient Spontanous Breathing  Post-op Assessment: Report given to RN and Post -op Vital signs reviewed and stable  Post vital signs: Reviewed and stable  Last Vitals:  Vitals Value Taken Time  BP 159/90 11/17/20 1327  Temp 36.1 C 11/17/20 1324  Pulse 77 11/17/20 1327  Resp 23 11/17/20 1327  SpO2 99 % 11/17/20 1327    Last Pain:  Vitals:   11/17/20 0922  TempSrc: Temporal  PainSc: 3          Complications: No complications documented.

## 2020-11-17 NOTE — Plan of Care (Signed)
Patient just arrived to the unit

## 2020-11-17 NOTE — Anesthesia Procedure Notes (Signed)
Procedure Name: Intubation Date/Time: 11/17/2020 10:53 AM Performed by: Louann Sjogren, CRNA Pre-anesthesia Checklist: Patient identified, Patient being monitored, Timeout performed, Emergency Drugs available and Suction available Patient Re-evaluated:Patient Re-evaluated prior to induction Oxygen Delivery Method: Circle system utilized Preoxygenation: Pre-oxygenation with 100% oxygen Induction Type: IV induction Ventilation: Mask ventilation without difficulty Laryngoscope Size: 3 and McGraph Grade View: Grade I Tube type: Oral Tube size: 7.0 mm Number of attempts: 1 Airway Equipment and Method: Stylet Placement Confirmation: ETT inserted through vocal cords under direct vision,  positive ETCO2 and breath sounds checked- equal and bilateral Secured at: 21 cm Tube secured with: Tape Dental Injury: Teeth and Oropharynx as per pre-operative assessment

## 2020-11-17 NOTE — Op Note (Signed)
11/17/2020  1:10 PM  Patient:   Rachel James  Pre-Op Diagnosis:   Advanced degenerative joint disease with moderate tendinopathy and partial-thickness rotator cuff tears, left shoulder.  Post-Op Diagnosis:   Same  Procedure:   Reverse left total shoulder arthroplasty.  Surgeon:   Pascal Lux, MD  Assistant:   Cameron Proud, PA-C; Sherlene Shams, PA-S  Anesthesia:   General endotracheal with an interscalene block using Exparel placed preoperatively by the anesthesiologist.  Findings:   As above.  Complications:   None  EBL:   150 cc  Fluids:   1000 cc crystalloid  UOP:   None  TT:   None  Drains:   None  Closure:   Staples  Implants:   All press-fit Integra system with a 14 mm stem, a standard metaphyseal body, a +6 mm humeral platform, a mini baseplate, and a 38 mm concentric +2 mm laterally offset glenosphere.  Brief Clinical Note:   The patient is a 74 year old female with a long history of progressively worsening left shoulder pain. Her symptoms have progressed despite medications, activity modification, etc. Her history and examination are consistent with advanced degenerative joint disease as confirmed by plain radiographs. Her MRI scan demonstrated moderate to severe tendinopathy with partial-thickness tears of the supraspinatus tendon. The patient presents at this time for a reverse left total shoulder arthroplasty.  Procedure:   The patient underwent placement of an interscalene block using Exparel by the anesthesiologist in the preoperative holding area before being brought into the operating room and lain in the supine position. The patient then underwent general endotracheal intubation and anesthesia before the patient was repositioned in the beach chair position using the beach chair positioner. The left shoulder and upper extremity were prepped with ChloraPrep solution before being draped sterilely. Preoperative antibiotics were administered.   A timeout was  performed to verify the appropriate surgical site before a standard anterior approach to the shoulder was made through an approximately 4-5 inch incision. The incision was carried down through the subcutaneous tissues to expose the deltopectoral fascia. The interval between the deltoid and pectoralis muscles was identified and this plane developed, retracting the cephalic vein laterally with the deltoid muscle. The conjoined tendon was identified. Its lateral margin was dissected and the Kolbel self-retraining retractor inserted. The "three sisters" were identified and cauterized. Bursal tissues were removed to improve visualization. The subscapularis tendon was released from its attachment to the lesser tuberosity 1 cm proximal to its insertion and several tagging sutures placed. The inferior capsule was released with care after identifying and protecting the axillary nerve. The proximal humeral cut was made at approximately 20 of retroversion using the extra-medullary guide.   Attention was redirected to the glenoid. The labrum was debrided circumferentially before the center of the glenoid was identified. The guidewire was drilled into the glenoid neck using the appropriate guide. After verifying its position, it was overreamed with the mini-baseplate reamer to create a flat surface before the stem reamer was utilized. The superior and inferior peg sites were reamed using the appropriate guide to complete the glenoid preparation. The permanent mini-baseplate was impacted into place. It was stabilized with a 20 x 4.5 mm central screw and four peripheral screws. Locking caps were placed over the superior and inferior screws. The permanent 38 mm concentric glenosphere with 2 mm of lateral offset was then impacted into place and its Morse taper locking mechanism verified using manual distraction.  Attention was directed to the humeral side. The  humeral canal was prepared utilizing the tapered stem reamers  sequentially beginning with the 7 mm stem and progressing to a 14 mm stem. This demonstrated a good tight fit. The metaphyseal region was then prepared using the appropriate planar device. The trial stem and small metaphyseal body were put together on the back table and a trial reduction performed using the +0 mm and +6 mm inserts.  Even with the +6 mm insert, the fit was somewhat loose.  Therefore, a repeat trial reduction was performed using the 14 mm trial stem and the standard metaphyseal body. With the +6 mm insert, the arm demonstrated excellent range of motion as the hand could be brought across the chest to the opposite shoulder and brought to the top of the patient's head and to the patient's ear. The shoulder appeared stable throughout this range of motion.   The joint was dislocated and the trial components removed. The permanent 14 mm stem with the standard body was impacted into place with care taken to maintain the appropriate version. A repeat trial reduction with the +6 mm insert again demonstrated excellent stability with the findings as described above. Therefore, the shoulder was re-dislocated and, after inserting the locking screw to secure the body to the stem, the permanent +6 mm insert impacted into place. After verifying its locking mechanism, the shoulder was relocated using two finger pressure and again placed through a range of motion with the findings as described above.  The wound was copiously irrigated with sterile saline solution using the jet lavage system before a total of 30 cc of 0.5% Sensorcaine with epinephrine was injected into the pericapsular and peri-incisional tissues to help with postoperative analgesia. The subscapularis tendon was reapproximated using #2 FiberWire interrupted sutures. The deltopectoral interval was closed using #0 Vicryl interrupted sutures before the subcutaneous tissues were closed using 2-0 Vicryl interrupted sutures. The skin was closed using  staples. Prior to closing the skin, 1 g of transexemic acid in 10 cc of normal saline was injected intra-articularly to help with postoperative bleeding. A sterile occlusive dressing was applied to the wound before the arm was placed into a shoulder immobilizer with an abduction pillow. A Polar Care system also was applied to the shoulder. The patient was then transferred back to a hospital bed before being awakened, extubated, and returned to the recovery room in satisfactory condition after tolerating the procedure well.

## 2020-11-17 NOTE — Anesthesia Preprocedure Evaluation (Signed)
Anesthesia Evaluation  Patient identified by MRN, date of birth, ID band Patient awake    Reviewed: Allergy & Precautions, H&P , NPO status , Patient's Chart, lab work & pertinent test results  History of Anesthesia Complications (+) PONVHistory of anesthetic complications: PONV years ago.  Airway Mallampati: II  TM Distance: >3 FB Neck ROM: full    Dental  (+) Teeth Intact   Pulmonary neg pulmonary ROS, neg sleep apnea, neg COPD,    breath sounds clear to auscultation       Cardiovascular hypertension, (-) angina(-) Past MI and (-) Cardiac Stents (-) dysrhythmias  Rhythm:regular Rate:Normal     Neuro/Psych negative neurological ROS  negative psych ROS   GI/Hepatic Neg liver ROS, GERD  ,  Endo/Other  negative endocrine ROS  Renal/GU Renal disease (CKD)     Musculoskeletal   Abdominal   Peds  Hematology negative hematology ROS (+)   Anesthesia Other Findings Past Medical History: No date: Allergy No date: Arthritis     Comment:  knees, lower back No date: Cervical radiculitis No date: Chronic radicular low back pain 07/11/2017: CKD (chronic kidney disease) stage 2, GFR 60-89 ml/min No date: Degenerative disc disease, lumbar No date: GERD (gastroesophageal reflux disease) No date: Hyperlipidemia LDL goal <100 No date: Hypertension 06/22/2017: Osteopenia     Comment:  June 2016 DEXA 2012: Pneumonia No date: PONV (postoperative nausea and vomiting)     Comment:  in past.  none recently  Past Surgical History: 2008:  THUMB SURGERY; Left     Comment:  joint replacement No date: APPENDECTOMY 1992: BREAST BIOPSY; Right     Comment:  neg 1992: BREAST LUMPECTOMY; Right 1978, 2002: BUNIONECTOMY; Bilateral 09/18/2012: CERVICAL DISCECTOMY     Comment:  C4-C5 AND C6-C7 ACDF  No date: COLONOSCOPY 2008: JOINT REPLACEMENT     Comment:  thumb 2019: JOINT REPLACEMENT; Right     Comment:  shoulder 11/04/2019: KNEE  ARTHROPLASTY; Right     Comment:  Procedure: COMPUTER ASSISTED TOTAL KNEE ARTHROPLASTY;                Surgeon: Dereck Leep, MD;  Location: [facility];                Service: Orthopedics;  Laterality: Right; 12/20/2017: SHOULDER ARTHROSCOPY WITH ROTATOR CUFF REPAIR AND OPEN  BICEPS TENODESIS; Right     Comment:  Procedure: SHOULDER ARTHROSCOPY WITH DEBRIDEMENT,               DECOMPRESSION AND  BICEPS TENODESIS;  Surgeon: Corky Mull, MD;  Location: [facility];  Service:               Orthopedics;  Laterality: Right; 06/10/2020: TOTAL HIP ARTHROPLASTY; Left     Comment:  Procedure: TOTAL HIP ARTHROPLASTY;  Surgeon: Dereck Leep, MD;  Location: [facility];  Service: Orthopedics;               Laterality: Left; 08/09/2018: TOTAL SHOULDER ARTHROPLASTY; Right     Comment:  Procedure: RIGHT TOTAL SHOULDER ARTHROPLASTY;  Surgeon:               Justice Britain, MD;  Location: [facility];  Service:               Orthopedics;  Laterality: Right; No date: TUBAL LIGATION; Bilateral  BMI    Body Mass Index: 23.43 kg/m      Reproductive/Obstetrics negative OB ROS                             Anesthesia Physical Anesthesia Plan  ASA: II  Anesthesia Plan: General ETT   Post-op Pain Management: GA combined w/ Regional for post-op pain   Induction:   PONV Risk Score and Plan: Ondansetron, Dexamethasone, Midazolam and Treatment may vary due to age or medical condition  Airway Management Planned:   Additional Equipment:   Intra-op Plan:   Post-operative Plan:   Informed Consent: I have reviewed the patients History and Physical, chart, labs and discussed the procedure including the risks, benefits and alternatives for the proposed anesthesia with the patient or authorized representative who has indicated his/her understanding and acceptance.     Dental Advisory Given  Plan Discussed with: Anesthesiologist, CRNA and  Surgeon  Anesthesia Plan Comments:         Anesthesia Quick Evaluation

## 2020-11-18 DIAGNOSIS — M75112 Incomplete rotator cuff tear or rupture of left shoulder, not specified as traumatic: Secondary | ICD-10-CM | POA: Diagnosis not present

## 2020-11-18 DIAGNOSIS — Z885 Allergy status to narcotic agent status: Secondary | ICD-10-CM | POA: Diagnosis not present

## 2020-11-18 DIAGNOSIS — M19012 Primary osteoarthritis, left shoulder: Secondary | ICD-10-CM | POA: Diagnosis not present

## 2020-11-18 DIAGNOSIS — Z888 Allergy status to other drugs, medicaments and biological substances status: Secondary | ICD-10-CM | POA: Diagnosis not present

## 2020-11-18 DIAGNOSIS — Z79899 Other long term (current) drug therapy: Secondary | ICD-10-CM | POA: Diagnosis not present

## 2020-11-18 LAB — CBC
HCT: 27.2 % — ABNORMAL LOW (ref 36.0–46.0)
Hemoglobin: 9.5 g/dL — ABNORMAL LOW (ref 12.0–15.0)
MCH: 30 pg (ref 26.0–34.0)
MCHC: 34.9 g/dL (ref 30.0–36.0)
MCV: 85.8 fL (ref 80.0–100.0)
Platelets: 222 10*3/uL (ref 150–400)
RBC: 3.17 MIL/uL — ABNORMAL LOW (ref 3.87–5.11)
RDW: 14 % (ref 11.5–15.5)
WBC: 13.1 10*3/uL — ABNORMAL HIGH (ref 4.0–10.5)
nRBC: 0 % (ref 0.0–0.2)

## 2020-11-18 LAB — BASIC METABOLIC PANEL
Anion gap: 10 (ref 5–15)
BUN: 16 mg/dL (ref 8–23)
CO2: 24 mmol/L (ref 22–32)
Calcium: 8.7 mg/dL — ABNORMAL LOW (ref 8.9–10.3)
Chloride: 100 mmol/L (ref 98–111)
Creatinine, Ser: 0.83 mg/dL (ref 0.44–1.00)
GFR, Estimated: >60 mL/min (ref >60)
Glucose, Bld: 136 mg/dL — ABNORMAL HIGH (ref 70–99)
Potassium: 3.8 mmol/L (ref 3.5–5.1)
Sodium: 134 mmol/L — ABNORMAL LOW (ref 135–145)

## 2020-11-18 MED ORDER — ENOXAPARIN SODIUM 40 MG/0.4ML ~~LOC~~ SOLN
SUBCUTANEOUS | Status: AC
  Filled 2020-11-18: qty 0.4

## 2020-11-18 MED ORDER — ACETAMINOPHEN 500 MG PO TABS
ORAL_TABLET | ORAL | Status: AC
  Administered 2020-11-18: 500 mg via ORAL
  Filled 2020-11-18: qty 1

## 2020-11-18 MED ORDER — KETOROLAC TROMETHAMINE 30 MG/ML IJ SOLN
INTRAMUSCULAR | Status: AC
  Filled 2020-11-18: qty 1

## 2020-11-18 MED ORDER — ASPIRIN EC 325 MG PO TBEC: 325.0000 mg | DELAYED_RELEASE_TABLET | Freq: Every day | ORAL | 0 refills | Status: DC

## 2020-11-18 MED ORDER — CEFAZOLIN SODIUM-DEXTROSE 2-4 GM/100ML-% IV SOLN
INTRAVENOUS | Status: AC
  Administered 2020-11-18: 2 g via INTRAVENOUS
  Filled 2020-11-18: qty 100

## 2020-11-18 MED ORDER — ONDANSETRON HCL 4 MG PO TABS: 4.0000 mg | ORAL_TABLET | Freq: Four times a day (QID) | ORAL | 0 refills | Status: DC | PRN

## 2020-11-18 MED ORDER — HYDROCODONE-ACETAMINOPHEN 5-325 MG PO TABS
1.0000 | ORAL_TABLET | ORAL | 0 refills | Status: DC | PRN
Start: 2020-11-18 — End: 2021-02-11

## 2020-11-18 MED ORDER — KETOROLAC TROMETHAMINE 30 MG/ML IJ SOLN
INTRAMUSCULAR | Status: AC
  Administered 2020-11-18: 7.5 mg via INTRAVENOUS
  Filled 2020-11-18: qty 1

## 2020-11-18 NOTE — Evaluation (Signed)
Physical Therapy Evaluation Patient Details Name: Rachel James MRN: [id number] DOB: Jun 06, 1946 Today's Date: 11/18/2020   History of Present Illness  74 y/o female s/p L total shoulder replacement 11/30  Clinical Impression  Note: late entry 11/18/2020 for eval completed 11/07/2020 Pt was seen in post op s/p L reverse total shoulder replacement.  She did well with mobility and showed good understanding her precautions and limitations.  She was able to do all mobility w/o assist and ambulated ~100 ft w/o AD or overt safety issues.  She does have some L knee/LE weakness that does not functionally limit her but did cause her to have minimal guarding/limp on the L during ambulation.  Pt will have niece to assist as much as needed at home, will need to show ability to negotiate steps before going home.   Follow Up Recommendations Follow surgeon's recommendation for DC plan and follow-up therapies;Supervision - Intermittent    Equipment Recommendations  None recommended by PT    Recommendations for Other Services       Precautions / Restrictions Precautions Precautions: Shoulder Type of Shoulder Precautions: reverse total shoulder Restrictions Weight Bearing Restrictions: Yes LUE Weight Bearing: Non weight bearing      Mobility  Bed Mobility Overal bed mobility: Independent             General bed mobility comments: easily gets to sitting EOB w/o assist    Transfers Overall transfer level: Modified independent               General transfer comment: able to rise w/o hesitation, no unsteadiness, etc  Ambulation/Gait Ambulation/Gait assistance: Supervision   Assistive device: None       General Gait Details: Pt with safe and relatively consistent cadence (did show some mild hesitancy with R WBing acceptance) with no LOBs, minimal fatigue  Stairs            Wheelchair Mobility    Modified Rankin (Stroke Patients Only)       Balance Overall balance  assessment: Modified Independent                                           Pertinent Vitals/Pain      Home Living Family/patient expects to be discharged to:: Private residence Living Arrangements: Alone Available Help at Discharge: Family;Available 24 hours/day (niece can/will stay as much as needed) Type of Home: House Home Access: Stairs to enter Entrance Stairs-Rails: Chemical engineer of Steps: 3-4 no rails, 6 with rails Home Layout: One level Home Equipment: Bedside commode;Tub bench;Adaptive equipment;Walker - 2 wheels;Cane - single point      Prior Function Level of Independence: Independent         Comments: active and indep prior to admission. has had 2 falls in the last 6 months     Hand Dominance        Extremity/Trunk Assessment                Communication   Communication: No difficulties  Cognition Arousal/Alertness: Awake/alert Behavior During Therapy: WFL for tasks assessed/performed Overall Cognitive Status: Within Functional Limits for tasks assessed                                        General Comments  Exercises     Assessment/Plan    PT Assessment Patient needs continued PT services  PT Problem List Decreased strength;Decreased range of motion;Decreased activity tolerance;Decreased balance;Decreased mobility;Decreased coordination;Decreased knowledge of use of DME;Decreased safety awareness;Pain       PT Treatment Interventions DME instruction;Gait training;Functional mobility training;Therapeutic activities;Therapeutic exercise;Balance training;Neuromuscular re-education;Patient/family education    PT Goals (Current goals can be found in the Care Plan section)  Acute Rehab PT Goals Patient Stated Goal: go home PT Goal Formulation: With patient Time For Goal Achievement: 12/02/20 Potential to Achieve Goals: Good    Frequency BID   Barriers to discharge         Co-evaluation               AM-PAC PT "6 Clicks" Mobility  Outcome Measure Help needed turning from your back to your side while in a flat bed without using bedrails?: None Help needed moving from lying on your back to sitting on the side of a flat bed without using bedrails?: None Help needed moving to and from a bed to a chair (including a wheelchair)?: A Little Help needed standing up from a chair using your arms (e.g., wheelchair or bedside chair)?: None Help needed to walk in hospital room?: A Little Help needed climbing 3-5 steps with a railing? : A Little 6 Click Score: 21    End of Session Equipment Utilized During Treatment: Gait belt Activity Tolerance: Patient tolerated treatment well Patient left: with bed alarm set;with nursing/sitter in room Nurse Communication: Mobility status PT Visit Diagnosis: Muscle weakness (generalized) (M62.81);Pain Pain - Right/Left: Left Pain - part of body: Shoulder    Time: 6195-0932 PT Time Calculation (min) (ACUTE ONLY): 28 min   Charges:   PT Evaluation $PT Eval Low Complexity: 1 Low PT Treatments $Therapeutic Activity: 8-22 mins        Kreg Shropshire, DPT 11/18/2020, 8:10 AM

## 2020-11-18 NOTE — Progress Notes (Signed)
Patient requested that I call [facility] office to confirm what PT will be coming to her home and they stated they will call back with that information due to the patient's concern that she will not be able to use the [facility] due to insurance.

## 2020-11-18 NOTE — Progress Notes (Signed)
Patient received a call from [facility] and they disclose to me that her insurance accepts [facility] now and will be able to offer her PT and OT. They stated she was fully insured and the first home visit will take place Friday 11/20/20 at her home.

## 2020-11-18 NOTE — Discharge Summary (Signed)
Physician Discharge Summary  Patient ID: Rachel James MRN: [id number] DOB/AGE: [birth year] 74 y.o.  Admit date: 11/17/2020 Discharge date: 11/18/2020  Admission Diagnoses:  Advanced degenerative joint disease of the left shoulder with moderate tendinopathy and partial thickness rotator cuff tearing. S/P Left reverse total shoulder arthroplasty  Discharge Diagnoses: Patient Active Problem List   Diagnosis Date Noted  . S/P reverse total shoulder arthroplasty, left 11/17/2020  . Hx of total hip arthroplasty, left 06/10/2020  . Primary osteoarthritis of left shoulder 06/08/2020  . Cold extremities 04/07/2020  . Primary osteoarthritis of left hip 04/05/2020  . Neck pain, chronic 01/15/2020  . Status post total right knee replacement 11/04/2019  . Cervical radiculitis 08/12/2019  . Dysfunction of Eustachian tube, left 08/12/2019  . Chronic jaw pain 08/12/2019  . Primary osteoarthritis of right knee 03/17/2019  . Scoliosis (and kyphoscoliosis), idiopathic 03/17/2019  . Status post total shoulder arthroplasty 08/09/2018  . Hyponatremia 03/31/2018  . Chronic right shoulder pain 09/26/2017  . Sciatica of left side 08/06/2017  . CKD (chronic kidney disease) stage 2, GFR 60-89 ml/min 07/11/2017  . Osteopenia 06/22/2017  . 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 Shoulder pain, bilateral 09/08/2016  . Need for prophylactic vaccination with Streptococcus pneumoniae (Pneumococcus) and Influenza vaccines 08/16/2016  . Urinary frequency 08/16/2016  . Cutaneous skin tags 04/18/2016  . Accidental fall 12/09/2015  . Hyperlipidemia LDL goal <100 09/17/2015  . Degenerative arthritis of lumbar spine with cord compression 07/16/2015  . Lumbar and sacral osteoarthritis 07/16/2015  . Bilateral change in hearing 05/27/2015  . Spinal stenosis, multilevel 05/27/2015  . Gastroesophageal reflux disease without esophagitis 05/27/2015  . Chronic radicular low back pain 05/27/2015   . Allergic rhinitis 01/06/2014  . Osteoarthrosis involving multiple sites 01/06/2014  . Arthropathia 01/06/2014  . Hypertension goal BP (blood pressure) < 140/90 01/06/2014  . Arthritis of right knee 01/06/2014   Past Medical History:  Diagnosis Date  . Allergy   . Arthritis    knees, lower back  . Cervical radiculitis   . Chronic radicular low back pain   . CKD (chronic kidney disease) stage 2, GFR 60-89 ml/min 07/11/2017  . Degenerative disc disease, lumbar   . GERD (gastroesophageal reflux disease)   . Hyperlipidemia LDL goal <100   . Hypertension   . Osteopenia 06/22/2017   June 2016 DEXA  . Pneumonia 2012  . PONV (postoperative nausea and vomiting)    in past.  none recently   Transfusion: None.   Consultants (if any): None.  Discharged Condition: Improved  Hospital Course: Rachel James is an 74 y.o. female who was admitted 11/17/2020 with a diagnosis of advanced degenerative joint disease of the left shoulder with moderate tendinopathy and partial thickness rotator cuff tearing. and went to the operating room on 11/17/2020 and underwent the above named procedures.    Surgeries: Procedure(s): REVERSE SHOULDER ARTHROPLASTY on 11/17/2020 Patient tolerated the surgery well. Taken to PACU where she was stabilized and then transferred to the post-op recovery area.  Started on Lovenox 40mg  q 24 hrs. Foot pumps applied bilaterally at 80 mm. Heels elevated on bed with rolled towels. No evidence of DVT. Negative Homan. Physical therapy started on day #1 for gait training and transfer. OT started day #1 for ADL and assisted devices.  Patient's IV was removed on POD1.  Implants: All press-fit Integra system with a 14 mm stem, a standard metaphyseal body, a +6 mm humeral platform, a mini baseplate, and a 38  mm concentric +2 mm laterally offset glenosphere.  She was given perioperative antibiotics:  Anti-infectives (From admission, onward)   Start     Dose/Rate Route Frequency  Ordered Stop   11/17/20 1600  ceFAZolin (ANCEF) IVPB 2g/100 mL premix        2 g 200 mL/hr over 30 Minutes Intravenous Every 6 hours 11/17/20 1441 11/18/20 0621   11/17/20 0913  ceFAZolin (ANCEF) 2-4 GM/100ML-% IVPB       Note to Pharmacy: Myles Lipps   : cabinet override      11/17/20 0913 11/17/20 1807   11/17/20 0600  ceFAZolin (ANCEF) IVPB 2g/100 mL premix        2 g 200 mL/hr over 30 Minutes Intravenous On call to O.R. 11/16/20 2311 11/17/20 1105    .  She was given sequential compression devices, early ambulation, and Lovenox for DVT prophylaxis.  She benefited maximally from the hospital stay and there were no complications.    Recent vital signs:  Vitals:   11/18/20 0538 11/18/20 0800  BP: 122/80 122/78  Pulse: 77 77  Resp: 16 16  Temp: 98.6 F (37 C) 97.7 F (36.5 C)  SpO2: 97% 100%    Recent laboratory studies:  Lab Results  Component Value Date   HGB 9.5 (L) 11/18/2020   HGB 11.7 (L) 11/09/2020   HGB 12.2 06/04/2020   Lab Results  Component Value Date   WBC 13.1 (H) 11/18/2020   PLT 222 11/18/2020   Lab Results  Component Value Date   INR 1.0 06/04/2020   Lab Results  Component Value Date   NA 134 (L) 11/18/2020   K 3.8 11/18/2020   CL 100 11/18/2020   CO2 24 11/18/2020   BUN 16 11/18/2020   CREATININE 0.83 11/18/2020   GLUCOSE 136 (H) 11/18/2020    Discharge Medications:   Allergies as of 11/18/2020      Reactions   Codeine Itching, Other (See Comments)   Nightmare   Tramadol Hcl    Light headed and nauseated   Oxycodone Itching      Medication List    TAKE these medications   acetaminophen 500 MG tablet Commonly known as: TYLENOL Take 1,000 mg by mouth 2 (two) times daily.   amoxicillin 500 MG capsule Commonly known as: AMOXIL Take 2,000 mg by mouth See admin instructions. Take 2000 mg by mouth prior to dental appointments   aspirin EC 325 MG tablet Take 1 tablet (325 mg total) by mouth daily.   carbamide peroxide 6.5 %  OTIC solution Commonly known as: DEBROX Place 5 drops into both ears 2 (two) times daily as needed (ear wax).   cholecalciferol 1000 units tablet Commonly known as: VITAMIN D Take 1 tablet (1,000 Units total) by mouth daily.   docusate sodium 100 MG capsule Commonly known as: COLACE Take 100 mg by mouth daily as needed for mild constipation.   famotidine 40 MG tablet Commonly known as: PEPCID TAKE 1 TABLET (40 MG TOTAL) BY MOUTH 2 (TWO) TIMES DAILY AS NEEDED FOR HEARTBURN OR INDIGESTION. What changed: when to take this   fluticasone 50 MCG/ACT nasal spray Commonly known as: FLONASE Place 1 spray into both nostrils daily as needed for allergies.   hydrochlorothiazide 12.5 MG capsule Commonly known as: MICROZIDE TAKE 1 CAPSULE BY MOUTH EVERY DAY What changed: how much to take   HYDROcodone-acetaminophen 5-325 MG tablet Commonly known as: NORCO/VICODIN Take 1-2 tablets by mouth every 4 (four) hours as needed for moderate pain (  pain score 4-6).   losartan 100 MG tablet Commonly known as: COZAAR Take 1 tablet (100 mg total) by mouth daily.   magnesium oxide 400 (241.3 Mg) MG tablet Commonly known as: MAG-OX Take 400 mg by mouth at bedtime.   metoprolol succinate 100 MG 24 hr tablet Commonly known as: TOPROL-XL Take 1 tablet (100 mg total) by mouth daily. Take with or immediately following a meal.   MULTIVITAMIN PO Take 1 tablet by mouth daily.   naproxen sodium 220 MG tablet Commonly known as: ALEVE Take 220 mg by mouth 2 (two) times daily with a meal.   ondansetron 4 MG tablet Commonly known as: ZOFRAN Take 1 tablet (4 mg total) by mouth every 6 (six) hours as needed for nausea.   oxymetazoline 0.05 % nasal spray Commonly known as: AFRIN Place 1 spray into both nostrils daily as needed for congestion.   simvastatin 10 MG tablet Commonly known as: ZOCOR Take 1 tablet (10 mg total) by mouth daily.   SOOTHE OP Place 1 drop into both eyes 3 (three) times daily as  needed (dry eyes).   tiZANidine 4 MG tablet Commonly known as: ZANAFLEX Take 1 tablet (4 mg total) by mouth every 8 (eight) hours as needed for muscle spasms. What changed: when to take this       Diagnostic Studies: DG Shoulder Left Port  Result Date: 11/17/2020 CLINICAL DATA:  Post reverse arthroplasty of the left shoulder. EXAM: LEFT SHOULDER COMPARISON:  None. FINDINGS: Post left total shoulder replacement. Alignment appears anatomic. No evidence of hardware failure or loosening. There is a minimal amount of intra-articular air. Overlying skin staples. No radiopaque foreign body. Limited visualization of the left hemithorax demonstrates apparent radiopaque support apparatus overlying the peripheral aspect of the left mid lung. Atherosclerotic plaque within the aortic arch. Post lower cervical ACDF, incompletely evaluated. IMPRESSION: Post left total shoulder replacement without evidence of hardware failure or loosening. Electronically Signed   By: Sandi Mariscal M.D.   On: 11/17/2020 14:05   [place] OR NERVE BLOCK-IMAGE ONLY [facility])  Result Date: 11/17/2020 There is no interpretation for this exam.  This order is for images obtained during a surgical procedure.  Please See "Surgeries" Tab for more information regarding the procedure.   MM 3D SCREEN BREAST BILATERAL  Result Date: 11/04/2020 CLINICAL DATA:  Screening. EXAM: DIGITAL SCREENING BILATERAL MAMMOGRAM WITH TOMO AND CAD COMPARISON:  Previous exam(s). ACR Breast Density Category b: There are scattered areas of fibroglandular density. FINDINGS: There are no findings suspicious for malignancy. Images were processed with CAD. IMPRESSION: No mammographic evidence of malignancy. A result letter of this screening mammogram will be mailed directly to the patient. RECOMMENDATION: Screening mammogram in one year. (Code:SM-B-01Y) BI-RADS CATEGORY  1: Negative. Electronically Signed   By: Valentino Saxon MD   On: 11/04/2020 13:42   Disposition:  Plan for discharge home this afternoon after working with PT/OT this AM.   Follow-up Information    Reche Dixon, PA-C Follow up in 14 day(s).   Specialty: Orthopedic Surgery Why: Staple removal. Contact information: [address] [place] [address] [phone]              Signed: Judson Roch PA-C 11/18/2020, 8:41 AM

## 2020-11-18 NOTE — Plan of Care (Signed)
Patient has met and completed all education

## 2020-11-18 NOTE — Evaluation (Signed)
Occupational Therapy Evaluation Patient Details Name: Rachel James MRN: [id number] DOB: [birth year] Today's Date: 11/18/2020    History of Present Illness 74 y/o female s/p L total shoulder replacement 11/30. PMH of HTN, current smoker, renal disease, TKR, THA (posterior approach), and R TSA.   Clinical Impression   [name]" [name] was seen for an OT evaluation this date. Pt lives alone in a 1-level home with 3 STE at the front, no rails. Prior to surgery, pt was active and independent. Pt has orders for LUE to be immobilized and will be NWBing per MD. Patient presents with impaired strength/ROM, and sensation to LUE with block not completely resolved yet. These impairments result in a decreased ability to perform self care tasks requiring MOD assist for UB/LB dressing and bathing and MAX assist for application of polar care, compression stockings, and sling/immobilizer. Pt instructed in polar care mgt, compression stockings mgt, sling/immobilizer mgt, ROM exercises for LUE (with instructions for no shoulder exercises until full sensation has returned), LUE precautions, adaptive strategies for bathing/dressing/toileting/grooming, positioning and considerations for sleep, and home/routines modifications to maximize falls prevention, safety, and independence. Handout provided. OT adjusted sling/immobilizer and polar care to improve comfort, optimize positioning, and to maximize skin integrity/safety. Pt verbalized understanding of all education/training provided with good safety awareness and recall. Will DC in house. Do not anticipate the need for f/u OT services upon hospital DC. Recommend pt follow surgeons recommendation for DC and f/u therapies.     Follow Up Recommendations  Follow surgeon's recommendation for DC plan and follow-up therapies    Equipment Recommendations  None recommended by OT    Recommendations for Other Services       Precautions / Restrictions Precautions Precautions:  Shoulder Type of Shoulder Precautions: reverse total shoulder Shoulder Interventions: Shoulder sling/immobilizer;Shoulder abduction pillow;At all times Precaution Booklet Issued: Yes (comment) Restrictions Weight Bearing Restrictions: Yes LUE Weight Bearing: Non weight bearing      Mobility Bed Mobility Overal bed mobility: Independent             General bed mobility comments: easily gets to sitting EOB w/o assist    Transfers Overall transfer level: Modified independent Equipment used: None             General transfer comment: Pt performs multiple STS from EOB w/o physical assist. Good safety awareness and control.    Balance Overall balance assessment: No apparent balance deficits (not formally assessed)                                         ADL either performed or assessed with clinical judgement   ADL Overall ADL's : Needs assistance/impaired                                       General ADL Comments: Pt functionally limited by decreased functional use of her LUE. She requires MAX A to don sling/polar care/compression stockings and MOD A for . MIN A for LB ADL management including dressing from STS, toileting, and LB bathing. Pt verbalizes plan to have family assist with dressing and bathing upon DC home. Feels confident in management of shoulder precautions with good reacall from past shoulder sx. Return demonstrates understanding of compensatory strategies for bathing and dressing.     Vision Baseline Vision/History: Wears  glasses Wears Glasses: At all times Patient Visual Report: No change from baseline       Perception     Praxis      Pertinent Vitals/Pain Pain Assessment:  (None, block still in place.)     Hand Dominance Right   Extremity/Trunk Assessment Upper Extremity Assessment Upper Extremity Assessment: Overall WFL for tasks assessed;LUE deficits/detail LUE Deficits / Details: S/p Rev. TSA with block  not fully resolved. Decr FMC/sensation on dorsal surface and 1st-3rd digits. LUE: Unable to fully assess due to immobilization LUE Sensation: decreased proprioception;decreased light touch LUE Coordination: decreased fine motor;decreased gross motor   Lower Extremity Assessment Lower Extremity Assessment: Defer to PT evaluation;Overall [facility] for tasks assessed   Cervical / Trunk Assessment Cervical / Trunk Assessment: Normal   Communication Communication Communication: No difficulties   Cognition Arousal/Alertness: Awake/alert Behavior During Therapy: WFL for tasks assessed/performed Overall Cognitive Status: Within Functional Limits for tasks assessed                                     General Comments       Exercises Exercises:  (educated on precautions and distal UE exercises) Other Exercises Other Exercises: Pt educated on falls prevention strategies, safe use of AE/DME for ADL management, sling/immobilizer management, compression stocking management, polar care management, hand/wrist exercises, pendulum exercises (with strict instructions to wait for full sensation to return before attempting), reverse total shoulder precautions, compensatory bathing and dressing strategies in consideration of shoulder precatuions and routines modifications to support safety and functional independence upon hospital DC. Other Exercises: OT facilitates: functional mobility, LB dressing, UB dressing, doff/don sling/immobilizer/polar care. See ADL section for additional detail.   Shoulder Instructions      Home Living Family/patient expects to be discharged to:: Private residence Living Arrangements: Alone Available Help at Discharge: Family;Available 24 hours/day (Niece available 24/7 for first 2 days after sx. PRN afterward.) Type of Home: House Home Access: Stairs to enter [employer] of Steps: 3-4 no rails, 6 with rails Entrance Stairs-Rails: Left;Right Home  Layout: One level     Bathroom Shower/Tub: Teacher, early years/pre: Standard     Home Equipment: Bedside commode;Tub bench;Adaptive equipment;Walker - 2 wheels;Cane - single point;Hand held shower head Adaptive Equipment: Reacher;Sock aid        Prior Functioning/Environment Level of Independence: Independent with assistive device(s)        Comments: Active and indep prior to admission. has had 2 falls in the last 6 months        OT Problem List: Decreased coordination;Decreased range of motion;Impaired UE functional use;Decreased knowledge of precautions;Decreased knowledge of use of DME or AE      OT Treatment/Interventions:      OT Goals(Current goals can be found in the care plan section) Acute Rehab OT Goals Patient Stated Goal: go home OT Goal Formulation: All assessment and education complete, DC therapy Time For Goal Achievement: 11/18/20 Potential to Achieve Goals: Good  OT Frequency:     Barriers to D/C:            Co-evaluation              AM-PAC OT "6 Clicks" Daily Activity     Outcome Measure Help from another person eating meals?: None Help from another person taking care of personal grooming?: None Help from another person toileting, which includes using toliet, bedpan, or urinal?: None Help  from another person bathing (including washing, rinsing, drying)?: A Little Help from another person to put on and taking off regular upper body clothing?: A Lot Help from another person to put on and taking off regular lower body clothing?: A Little 6 Click Score: 20   End of Session Equipment Utilized During Treatment: Other (comment) (Lh reacher) Nurse Communication: Mobility status  Activity Tolerance: Patient tolerated treatment well Patient left: Other (comment) (Seated EOB, RN notified pt would like recliner.)  OT Visit Diagnosis: Other abnormalities of gait and mobility (R26.89)                Time: 1601-0932 OT Time Calculation  (min): 56 min Charges:  OT General Charges $OT Visit: 1 Visit OT Evaluation $OT Eval Moderate Complexity: 1 Mod OT Treatments $Self Care/Home Management : 38-52 mins  Shara Blazing, M.S., OTR/L Ascom: [phone] 11/18/20, 10:25 AM

## 2020-11-18 NOTE — Discharge Instructions (Signed)
Diet: As you were doing prior to hospitalization  ° °Shower:  May shower but keep the wounds dry, use an occlusive plastic wrap, NO SOAKING IN TUB.  If the bandage gets wet, change with a clean dry gauze. ° °Dressing:  You may change your dressing as needed. Change the dressing with sterile gauze dressing.   ° °Activity:  Increase activity slowly as tolerated, but follow the weight bearing instructions below.  No lifting or driving for 6 weeks. ° °Weight Bearing:   Non-weightbearing to the left arm. ° °To prevent constipation: you may use a stool softener such as - ° °Colace (over the counter) 100 mg by mouth twice a day  °Drink plenty of fluids (prune juice may be helpful) and high fiber foods °Miralax (over the counter) for constipation as needed.   ° °Itching:  If you experience itching with your medications, try taking only a single pain pill, or even half a pain pill at a time.  You may take up to 10 pain pills per day, and you can also use benadryl over the counter for itching or also to help with sleep.  ° °Precautions:  If you experience chest pain or shortness of breath - call 911 immediately for transfer to the hospital emergency department!! ° °If you develop a fever greater that 101 F, purulent drainage from wound, increased redness or drainage from wound, or calf pain-Call [facility]                                              °Follow- Up Appointment:  Please call for an appointment to be seen in 2 weeks at [facility] °

## 2020-11-18 NOTE — Progress Notes (Signed)
Physical Therapy Treatment Patient Details Name: Rachel James MRN: [id number] DOB: [birth year] Today's Date: 11/18/2020    History of Present Illness 74 y/o female s/p L total shoulder replacement 11/30. PMH of HTN, current smoker, renal disease, TKR, THA (posterior approach), and R TSA.    PT Comments    Pt did not have any safety issues t/o session, though she does continue to have some R knee hyperextension and needs to be deliberate about placement/weight acceptance and generally in keeping a consistent cadence regarding it's positioning.  She did have some fatigue with prolonged ambulation - O2 remains in the high 90s, she has some fatigue but HR remained below 100.  Pt was able to negotiate up/down 4 steps x 2, did better with single HHA than w/o UE (encouraged her to use SPC at home).  Follow Up Recommendations  Follow surgeon's recommendation for DC plan and follow-up therapies;Supervision - Intermittent     Equipment Recommendations  None recommended by PT    Recommendations for Other Services       Precautions / Restrictions Precautions Precautions: Shoulder Type of Shoulder Precautions: reverse total shoulder Shoulder Interventions: Shoulder sling/immobilizer;Shoulder abduction pillow;At all times Precaution Booklet Issued: Yes (comment) Restrictions Weight Bearing Restrictions: Yes LUE Weight Bearing: Non weight bearing    Mobility  Bed Mobility Overal bed mobility: Independent             General bed mobility comments: in recliner on arrival, not tested  Transfers Overall transfer level: Modified independent Equipment used: None             General transfer comment: Pt performs multiple STS from EOB w/o physical assist. Good safety awareness and control.  Ambulation/Gait Ambulation/Gait assistance: Supervision Gait Distance (Feet): 200 Feet Assistive device: None       General Gait Details: Pt with safe and relatively consistent cadence  (continues to have some knee hyperextension with WBing) with no LOBs, minimal fatigue   Stairs Stairs: Yes Stairs assistance: Min guard Stair Management: Step to pattern Number of Stairs: 8 General stair comments: 4 steps with single HHA use, then 4 steps w/o UE support but clearly safer and more confident with UE support   Wheelchair Mobility    Modified Rankin (Stroke Patients Only)       Balance Overall balance assessment: No apparent balance deficits (not formally assessed)                                          Cognition Arousal/Alertness: Awake/alert Behavior During Therapy: WFL for tasks assessed/performed Overall Cognitive Status: Within Functional Limits for tasks assessed                                        Exercises Other Exercises Other Exercises: Pt educated on falls prevention strategies, safe use of AE/DME for ADL management, sling/immobilizer management, compression stocking management, polar care management, hand/wrist exercises, pendulum exercises (with strict instructions to wait for full sensation to return before attempting), reverse total shoulder precautions, compensatory bathing and dressing strategies in consideration of shoulder precatuions and routines modifications to support safety and functional independence upon hospital DC. Other Exercises: OT facilitates: functional mobility, LB dressing, UB dressing, doff/don sling/immobilizer/polar care. See ADL section for additional detail.    General Comments  Pertinent Vitals/Pain Pain Assessment: No/denies pain    Home Living Family/patient expects to be discharged to:: Private residence Living Arrangements: Alone Available Help at Discharge: Family;Available 24 hours/day (Niece available 24/7 for first 2 days after sx. PRN afterward.) Type of Home: House Home Access: Stairs to enter Entrance Stairs-Rails: Left;Right Home Layout: One level Home  Equipment: Bedside commode;Tub bench;Adaptive equipment;Walker - 2 wheels;Cane - single point;Hand held shower head      Prior Function Level of Independence: Independent with assistive device(s)      Comments: Active and indep prior to admission. has had 2 falls in the last 6 months   PT Goals (current goals can now be found in the care plan section) Acute Rehab PT Goals Patient Stated Goal: go home Progress towards PT goals: Progressing toward goals    Frequency    BID      PT Plan Current plan remains appropriate    Co-evaluation              AM-PAC PT "6 Clicks" Mobility   Outcome Measure  Help needed turning from your back to your side while in a flat bed without using bedrails?: None Help needed moving from lying on your back to sitting on the side of a flat bed without using bedrails?: None Help needed moving to and from a bed to a chair (including a wheelchair)?: A Little Help needed standing up from a chair using your arms (e.g., wheelchair or bedside chair)?: None Help needed to walk in hospital room?: A Little Help needed climbing 3-5 steps with a railing? : A Little 6 Click Score: 21    End of Session Equipment Utilized During Treatment: Gait belt Activity Tolerance: Patient tolerated treatment well Patient left: with bed alarm set;with nursing/sitter in room Nurse Communication: Mobility status PT Visit Diagnosis: Muscle weakness (generalized) (M62.81);Pain Pain - Right/Left: Left Pain - part of body: Shoulder     Time: 7628-3151 PT Time Calculation (min) (ACUTE ONLY): 25 min  Charges:  $Gait Training: 8-22 mins $Therapeutic Activity: 8-22 mins                     Kreg Shropshire, DPT 11/18/2020, 1:08 PM

## 2020-11-18 NOTE — Progress Notes (Signed)
  Subjective: 1 Day Post-Op Procedure(s) (LRB): REVERSE SHOULDER ARTHROPLASTY (Left) Patient reports pain as 0 on 0-10 scale.   Patient is well, and has had no acute complaints or problems Plan is to go Home after hospital stay. Negative for chest pain and shortness of breath Fever: no Gastrointestinal:Negative for nausea and vomiting Patient still with numbness in the left arm following nerve block.  Objective: Vital signs in last 24 hours: Temp:  [96.9 F (36.1 C)-98.6 F (37 C)] 97.7 F (36.5 C) (12/01 0800) Pulse Rate:  [62-82] 77 (12/01 0800) Resp:  [13-23] 16 (12/01 0800) BP: (122-180)/(70-92) 122/78 (12/01 0800) SpO2:  [97 %-100 %] 100 % (12/01 0800) Weight:  [60 kg] 60 kg (11/30 0922)  Intake/Output from previous day:  Intake/Output Summary (Last 24 hours) at 11/18/2020 0827 Last data filed at 11/17/2020 1439 Gross per 24 hour  Intake 1540 ml  Output 600 ml  Net 940 ml    Intake/Output this shift: No intake/output data recorded.  Labs: Recent Labs    11/18/20 0528  HGB 9.5*   Recent Labs    11/18/20 0528  WBC 13.1*  RBC 3.17*  HCT 27.2*  PLT 222   Recent Labs    11/18/20 0528  NA 134*  K 3.8  CL 100  CO2 24  BUN 16  CREATININE 0.83  GLUCOSE 136*  CALCIUM 8.7*   No results for input(s): LABPT, INR in the last 72 hours.   EXAM General - Patient is Alert, Appropriate and Oriented Extremity - ABD soft Intact pulses distally Incision: dressing C/D/I No cellulitis present  Patient with decreased sensation to the left arm this AM, able to flex and move fingers this morning.  Able to move wrist around as well. Dressing/Incision - clean, dry, no drainage Motor Function - intact, moving foot and toes well on exam.  Abdomen soft with normal bowel sounds.  Past Medical History:  Diagnosis Date  . Allergy   . Arthritis    knees, lower back  . Cervical radiculitis   . Chronic radicular low back pain   . CKD (chronic kidney disease) stage 2,  GFR 60-89 ml/min 07/11/2017  . Degenerative disc disease, lumbar   . GERD (gastroesophageal reflux disease)   . Hyperlipidemia LDL goal <100   . Hypertension   . Osteopenia 06/22/2017   June 2016 DEXA  . Pneumonia 2012  . PONV (postoperative nausea and vomiting)    in past.  none recently    Assessment/Plan: 1 Day Post-Op Procedure(s) (LRB): REVERSE SHOULDER ARTHROPLASTY (Left) Active Problems:   Status post revision of total knee replacement, left  Estimated body mass index is 23.43 kg/m as calculated from the following:   Height as of this encounter: 5\' 3"  (1.6 m).   Weight as of this encounter: 60 kg. Advance diet Up with therapy D/C IV fluids when tolerating po intake.  Decreased sensation from nerve block still in affect this AM. Patient worked well with PT yesterday morning.  PT/OT to evaluate patient this AM. Sling intact to the left shoulder. Plan for discharge home this afternoon after working with PT/OT.  DVT Prophylaxis - Lovenox and TED hose Non-weightbearing to the left arm.  Rachel Jasmine Mcbeth, PA-C [facility] Orthopa[facility] 11/18/2020, 8:27 AM

## 2020-11-19 LAB — SURGICAL PATHOLOGY

## 2020-11-19 NOTE — Anesthesia Postprocedure Evaluation (Signed)
Anesthesia Post Note  Patient: Rachel James  Procedure(s) Performed: REVERSE SHOULDER ARTHROPLASTY (Left Shoulder)  Patient location during evaluation: PACU Anesthesia Type: General Level of consciousness: awake and alert Pain management: pain level controlled Vital Signs Assessment: post-procedure vital signs reviewed and stable Respiratory status: spontaneous breathing, nonlabored ventilation and respiratory function stable Cardiovascular status: blood pressure returned to baseline and stable Postop Assessment: no apparent nausea or vomiting Anesthetic complications: no   No complications documented.   Last Vitals:  Vitals:   11/18/20 1030 11/18/20 1332  BP: 122/71 119/71  Pulse: 71   Resp:  16  Temp:  36.6 C  SpO2: 99%     Last Pain:  Vitals:   11/18/20 0730  TempSrc:   PainSc: 0-No pain                 Brett Canales Lemma Tetro

## 2020-11-20 DIAGNOSIS — K219 Gastro-esophageal reflux disease without esophagitis: Secondary | ICD-10-CM | POA: Diagnosis not present

## 2020-11-20 DIAGNOSIS — N182 Chronic kidney disease, stage 2 (mild): Secondary | ICD-10-CM | POA: Diagnosis not present

## 2020-11-20 DIAGNOSIS — Z471 Aftercare following joint replacement surgery: Secondary | ICD-10-CM | POA: Diagnosis not present

## 2020-11-20 DIAGNOSIS — M48061 Spinal stenosis, lumbar region without neurogenic claudication: Secondary | ICD-10-CM | POA: Diagnosis not present

## 2020-11-20 DIAGNOSIS — E785 Hyperlipidemia, unspecified: Secondary | ICD-10-CM | POA: Diagnosis not present

## 2020-11-20 DIAGNOSIS — M4120 Other idiopathic scoliosis, site unspecified: Secondary | ICD-10-CM | POA: Diagnosis not present

## 2020-11-20 DIAGNOSIS — I129 Hypertensive chronic kidney disease with stage 1 through stage 4 chronic kidney disease, or unspecified chronic kidney disease: Secondary | ICD-10-CM | POA: Diagnosis not present

## 2020-11-20 DIAGNOSIS — H9193 Unspecified hearing loss, bilateral: Secondary | ICD-10-CM | POA: Diagnosis not present

## 2020-11-20 DIAGNOSIS — J309 Allergic rhinitis, unspecified: Secondary | ICD-10-CM | POA: Diagnosis not present

## 2020-11-20 DIAGNOSIS — M8580 Other specified disorders of bone density and structure, unspecified site: Secondary | ICD-10-CM | POA: Diagnosis not present

## 2020-11-24 ENCOUNTER — Encounter: Payer: Medicare HMO | Admitting: Family Medicine

## 2020-11-24 ENCOUNTER — Ambulatory Visit: Payer: Medicare HMO

## 2020-11-27 ENCOUNTER — Other Ambulatory Visit: Payer: Self-pay

## 2020-11-27 ENCOUNTER — Encounter: Payer: Self-pay | Admitting: Internal Medicine

## 2020-11-27 DIAGNOSIS — I10 Essential (primary) hypertension: Secondary | ICD-10-CM

## 2020-11-27 MED ORDER — HYDROCHLOROTHIAZIDE 12.5 MG PO CAPS: 12.5000 mg | ORAL_CAPSULE | Freq: Every day | ORAL | 0 refills | Status: DC

## 2020-11-30 DIAGNOSIS — Z471 Aftercare following joint replacement surgery: Secondary | ICD-10-CM | POA: Diagnosis not present

## 2020-12-01 DIAGNOSIS — M6281 Muscle weakness (generalized): Secondary | ICD-10-CM | POA: Diagnosis not present

## 2020-12-01 DIAGNOSIS — M25612 Stiffness of left shoulder, not elsewhere classified: Secondary | ICD-10-CM | POA: Diagnosis not present

## 2020-12-01 DIAGNOSIS — Z96612 Presence of left artificial shoulder joint: Secondary | ICD-10-CM | POA: Diagnosis not present

## 2020-12-01 DIAGNOSIS — M25512 Pain in left shoulder: Secondary | ICD-10-CM | POA: Diagnosis not present

## 2020-12-09 DIAGNOSIS — M25612 Stiffness of left shoulder, not elsewhere classified: Secondary | ICD-10-CM | POA: Diagnosis not present

## 2020-12-09 DIAGNOSIS — M6281 Muscle weakness (generalized): Secondary | ICD-10-CM | POA: Diagnosis not present

## 2020-12-09 DIAGNOSIS — M25512 Pain in left shoulder: Secondary | ICD-10-CM | POA: Diagnosis not present

## 2020-12-15 DIAGNOSIS — Z96612 Presence of left artificial shoulder joint: Secondary | ICD-10-CM | POA: Diagnosis not present

## 2020-12-15 DIAGNOSIS — M25512 Pain in left shoulder: Secondary | ICD-10-CM | POA: Diagnosis not present

## 2020-12-15 DIAGNOSIS — M25612 Stiffness of left shoulder, not elsewhere classified: Secondary | ICD-10-CM | POA: Diagnosis not present

## 2020-12-15 DIAGNOSIS — M6281 Muscle weakness (generalized): Secondary | ICD-10-CM | POA: Diagnosis not present

## 2020-12-22 DIAGNOSIS — M25612 Stiffness of left shoulder, not elsewhere classified: Secondary | ICD-10-CM | POA: Diagnosis not present

## 2020-12-22 DIAGNOSIS — Z96612 Presence of left artificial shoulder joint: Secondary | ICD-10-CM | POA: Diagnosis not present

## 2020-12-22 DIAGNOSIS — M25512 Pain in left shoulder: Secondary | ICD-10-CM | POA: Diagnosis not present

## 2020-12-22 DIAGNOSIS — M6281 Muscle weakness (generalized): Secondary | ICD-10-CM | POA: Diagnosis not present

## 2020-12-24 ENCOUNTER — Telehealth: Payer: Self-pay

## 2020-12-24 NOTE — Telephone Encounter (Signed)
I reviewed our last visit in April with labs from that visit. Labs without findings of concern. From that visit, a follow-up visit will be recommended in about April 2022 to recheck labs as part of that. I may not be aware of the potential need for laboratory studies based on something that has happened since that visit. With me leaving this practice very shortly, I do feel it is important for her to have a new PCP to help follow over time, and do feel it would be best to have a follow-up with that new provider who can check routine labs again and continue to follow after.

## 2020-12-24 NOTE — Telephone Encounter (Signed)
Copied from CRM [id number]. Topic: General - Other >> Dec 23, 2020  9:19 AM Lyn Hollingshead D wrote: PT requesting appt just for lab work // please advise

## 2020-12-25 NOTE — Telephone Encounter (Signed)
Sent message via MyChart.

## 2020-12-28 NOTE — Progress Notes (Signed)
Patient ID: Rachel James, female    DOB: [birth year], 75 y.o.   MRN: [id number]  PCP: Towanda Malkin, MD  Chief Complaint  Patient presents with  . Hypertension  . Medication Refill    Subjective:   Rachel James is a 75 y.o. female, presents to clinic with CC of the following:  Chief Complaint  Patient presents with  . Hypertension  . Medication Refill    HPI:  Patient is a 75 y.o. female After this message was received Monday, it was recommended she have a f/u visit with a provider to assess. Greetings Rachel James I had shoulder replacement surgery on Nov 30. I'm still experiencing considerable pain.  My blood pressure has been high  (176/97 range), my ankles are swollen, and my vision is blurry.  I wonder  if I should temporarily increase one of my BP meds until the pain subsides. Will you please tell Dr Roxan Hockey and see what he thinks?  Thank you.  Sincerely, Rachel James F/u's up today   Noted X-mas day bumped arm and pain increased again and has been having increased pain since. Saw ortho on f/u yesterday and x-rays ok, steroids started. Taking tylenol and ibuprofen prn for pain.  She thinks her blood pressures have been higher on home checks recently due to the increased pain, and I do think that is a possibility.  She notes her ankles get a little swollen at the end of the day mostly, not a problem in the mornings.  She also notes her vision has been a little more blurry, has not seen the eye doctor in over a year, and denies any loss of vision, double vision.  [id number] regimen - Losartan 100mg  daily, Metoprolol - 100mg , HCTZ-12.5mg  Does check BPs at home, with a cuff and have been running 170/high 90's for past 5 days, not taking prior. Not miss taking medications no CP, SOB,palps,occasswelling of ankles at end of day, not presently  BP Readings from Last 3 Encounters:  12/30/20 (!) 152/80  11/18/20 119/71  11/09/20 (!) 157/72     [id number] creatine good Lab Results  Component Value Date   CREATININE 0.83 11/18/2020   BUN 16 11/18/2020   NA 134 (L) 11/18/2020   K 3.8 11/18/2020   CL 100 11/18/2020   CO2 24 11/18/2020    HLD: Taking simvastatin;  no chest pain, shortness of breath or myalgias.   Lab Results  Component Value Date   CHOL 204 (H) 04/07/2020   HDL 87 04/07/2020   LDLCALC 101 (H) 04/07/2020   TRIG 74 04/07/2020   CHOLHDL 2.3 04/07/2020                OA -she has been having increased pain after her most recent shoulder surgery, has been having physical therapy, last visit was last week.  She notes she will be going to twice a week visits with PT shortly.  She noted she plans to head to [place] after the first week of February.  She is also going to [place] next week for a few days.  Originally was planning to go right to [place] from [place], although with the shoulder issues, she is delaying that till after the first week in February.    Patient Active Problem List   Diagnosis Date Noted  . S/P reverse total shoulder arthroplasty, left 11/17/2020  . Hx of total hip arthroplasty, left 06/10/2020  . Primary osteoarthritis of left shoulder 06/08/2020  .  Cold extremities 04/07/2020  . Primary osteoarthritis of left hip 04/05/2020  . Neck pain, chronic 01/15/2020  . Status post total right knee replacement 11/04/2019  . Cervical radiculitis 08/12/2019  . Dysfunction of Eustachian tube, left 08/12/2019  . Chronic jaw pain 08/12/2019  . Primary osteoarthritis of right knee 03/17/2019  . Scoliosis (and kyphoscoliosis), idiopathic 03/17/2019  . Status post total shoulder arthroplasty 08/09/2018  . Hyponatremia 03/31/2018  . Chronic right shoulder pain 09/26/2017  . Sciatica of left side 08/06/2017  . CKD (chronic kidney disease) stage 2, GFR 60-89 ml/min 07/11/2017  . Osteopenia 06/22/2017  . 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 Shoulder pain, bilateral  09/08/2016  . Need for prophylactic vaccination with Streptococcus pneumoniae (Pneumococcus) and Influenza vaccines 08/16/2016  . Urinary frequency 08/16/2016  . Cutaneous skin tags 04/18/2016  . Accidental fall 12/09/2015  . Hyperlipidemia LDL goal <100 09/17/2015  . Degenerative arthritis of lumbar spine with cord compression 07/16/2015  . Lumbar and sacral osteoarthritis 07/16/2015  . Bilateral change in hearing 05/27/2015  . Spinal stenosis, multilevel 05/27/2015  . Gastroesophageal reflux disease without esophagitis 05/27/2015  . Chronic radicular low back pain 05/27/2015  . Allergic rhinitis 01/06/2014  . Osteoarthrosis involving multiple sites 01/06/2014  . Arthropathia 01/06/2014  . Hypertension goal BP (blood pressure) < 140/90 01/06/2014  . Arthritis of right knee 01/06/2014      Current Outpatient Medications:  .  acetaminophen (TYLENOL) 500 MG tablet, Take 1,000 mg by mouth 2 (two) times daily., Disp: , Rfl:  .  aspirin EC 325 MG tablet, Take 1 tablet (325 mg total) by mouth daily., Disp: 30 tablet, Rfl: 0 .  docusate sodium (COLACE) 100 MG capsule, Take 100 mg by mouth daily as needed for mild constipation. , Disp: , Rfl:  .  famotidine (PEPCID) 40 MG tablet, TAKE 1 TABLET (40 MG TOTAL) BY MOUTH 2 (TWO) TIMES DAILY AS NEEDED FOR HEARTBURN OR INDIGESTION. (Patient taking differently: Take 40 mg by mouth at bedtime.), Disp: 180 tablet, Rfl: 1 .  fluticasone (FLONASE) 50 MCG/ACT nasal spray, Place 1 spray into both nostrils daily as needed for allergies. , Disp: , Rfl:  .  hydrochlorothiazide (MICROZIDE) 12.5 MG capsule, Take 1 capsule (12.5 mg total) by mouth daily., Disp: 90 capsule, Rfl: 0 .  HYDROcodone-acetaminophen (NORCO/VICODIN) 5-325 MG tablet, Take 1-2 tablets by mouth every 4 (four) hours as needed for moderate pain (pain score 4-6)., Disp: 60 tablet, Rfl: 0 .  losartan (COZAAR) 100 MG tablet, Take 1 tablet (100 mg total) by mouth daily., Disp: 90 tablet, Rfl: 3 .   magnesium oxide (MAG-OX) 400 (241.3 Mg) MG tablet, Take 400 mg by mouth at bedtime. , Disp: , Rfl:  .  methylPREDNISolone (MEDROL DOSEPAK) 4 MG TBPK tablet, See admin instructions., Disp: , Rfl:  .  metoprolol succinate (TOPROL-XL) 100 MG 24 hr tablet, Take 1 tablet (100 mg total) by mouth daily. Take with or immediately following a meal., Disp: 90 tablet, Rfl: 1 .  Multiple Vitamins-Minerals (MULTIVITAMIN PO), Take 1 tablet by mouth daily., Disp: , Rfl:  .  naproxen sodium (ALEVE) 220 MG tablet, Take 220 mg by mouth 2 (two) times daily with a meal., Disp: , Rfl:  .  ondansetron (ZOFRAN) 4 MG tablet, Take 1 tablet (4 mg total) by mouth every 6 (six) hours as needed for nausea., Disp: 20 tablet, Rfl: 0 .  ondansetron (ZOFRAN-ODT) 4 MG disintegrating tablet, Take by mouth., Disp: , Rfl:  .  oxymetazoline (AFRIN) 0.05 % nasal spray, Place 1 spray into both nostrils daily as needed for congestion., Disp: , Rfl:  .  simvastatin (ZOCOR) 10 MG tablet, Take 1 tablet (10 mg total) by mouth daily., Disp: 90 tablet, Rfl: 1 .  tiZANidine (ZANAFLEX) 4 MG tablet, Take 1 tablet (4 mg total) by mouth every 8 (eight) hours as needed for muscle spasms. (Patient taking differently: Take 4 mg by mouth at bedtime.), Disp: 90 tablet, Rfl: 1 .  amoxicillin (AMOXIL) 500 MG capsule, Take 2,000 mg by mouth See admin instructions. Take 2000 mg by mouth prior to dental appointments (Patient not taking: Reported on 12/30/2020), Disp: , Rfl:  .  carbamide peroxide (DEBROX) 6.5 % OTIC solution, Place 5 drops into both ears 2 (two) times daily as needed (ear wax).  (Patient not taking: Reported on 12/30/2020), Disp: , Rfl:  .  cholecalciferol (VITAMIN D) 1000 units tablet, Take 1 tablet (1,000 Units total) by mouth daily. (Patient not taking: Reported on 12/30/2020), Disp: , Rfl:  .  Propylene Glycol-Glycerin (SOOTHE OP), Place 1 drop into both eyes 3 (three) times daily as needed (dry eyes). (Patient not taking: Reported on 12/30/2020),  Disp: , Rfl:    Allergies  Allergen Reactions  . Codeine Itching and Other (See Comments)    Nightmare   . Tramadol Hcl     Light headed and nauseated  . Oxycodone Itching     Past Surgical History:  Procedure Laterality Date  .  THUMB SURGERY Left 2008   joint replacement  . APPENDECTOMY    . BREAST BIOPSY Right 1992   neg  . BREAST LUMPECTOMY Right 1992  . BUNIONECTOMY Bilateral 1978, 2002  . CERVICAL DISCECTOMY  09/18/2012   C4-C5 AND C6-C7 ACDF   . COLONOSCOPY    . JOINT REPLACEMENT  2008   thumb  . JOINT REPLACEMENT Right 2019   shoulder  . KNEE ARTHROPLASTY Right 11/04/2019   Procedure: COMPUTER ASSISTED TOTAL KNEE ARTHROPLASTY;  Surgeon: Dereck Leep, MD;  Location: [facility];  Service: Orthopedics;  Laterality: Right;  . REVERSE SHOULDER ARTHROPLASTY Left 11/17/2020   Procedure: REVERSE SHOULDER ARTHROPLASTY;  Surgeon: Corky Mull, MD;  Location: [facility];  Service: Orthopedics;  Laterality: Left;  . SHOULDER ARTHROSCOPY WITH ROTATOR CUFF REPAIR AND OPEN BICEPS TENODESIS Right 12/20/2017   Procedure: SHOULDER ARTHROSCOPY WITH DEBRIDEMENT, DECOMPRESSION AND  BICEPS TENODESIS;  Surgeon: Corky Mull, MD;  Location: [place];  Service: Orthopedics;  Laterality: Right;  . TOTAL HIP ARTHROPLASTY Left 06/10/2020   Procedure: TOTAL HIP ARTHROPLASTY;  Surgeon: Dereck Leep, MD;  Location: [facility];  Service: Orthopedics;  Laterality: Left;  . TOTAL SHOULDER ARTHROPLASTY Right 08/09/2018   Procedure: RIGHT TOTAL SHOULDER ARTHROPLASTY;  Surgeon: Justice Britain, MD;  Location: [place];  Service: Orthopedics;  Laterality: Right;  . TUBAL LIGATION Bilateral      Family History  Problem Relation Age of Onset  . Heart disease Mother   . Heart attack Mother   . Arthritis Mother   . Heart disease Father   . Heart disease Sister   . Arthritis Sister   . Heart disease Brother   . Heart failure Brother   . Memory loss Brother   . Arthritis Maternal Grandmother    . Diabetes Paternal Grandmother   . Pulmonary embolism Sister   . Breast cancer Neg Hx      Social History   Tobacco Use  . Smoking status: Never Smoker  .  Smokeless tobacco: Never Used  . Tobacco comment: smoking cessation materials not required  Substance Use Topics  . Alcohol use: Not Currently    With staff assistance, above reviewed with the patient today.  ROS: As per HPI, otherwise no specific complaints on a limited and focused system review   No results found for this or any previous visit (from the past 72 hour(s)).   PHQ2/9: [facility] 12/30/2020 04/29/2020 04/07/2020 02/28/2020 01/15/2020  Decreased Interest 0 0 0 0 0  Down, Depressed, Hopeless 0 0 0 0 0  PHQ - 2 Score 0 0 0 0 0  Altered sleeping - 0 0 1 1  Tired, decreased energy - 1 0 0 0  Change in appetite - 0 0 0 0  Feeling bad or failure about yourself  - 0 0 0 0  Trouble concentrating - 0 0 0 0  Moving slowly or fidgety/restless - 0 0 0 0  Suicidal thoughts - 0 0 0 0  PHQ-9 Score - 1 0 1 1  Difficult doing work/chores - Not difficult at all Not difficult at all Not difficult at all Not difficult at all  Some recent data might be hidden   PHQ-2/9 Result is neg  Fall Risk: Fall Risk  12/30/2020 08/05/2020 04/29/2020 04/07/2020 02/28/2020  Falls in the past year? 0 0 0 0 0  Number falls in past yr: 0 - 0 0 0  Injury with Fall? 0 - 0 0 0  Risk for fall due to : - Orthopedic patient;Medication side effect - - -  Risk for fall due to: Comment - - - - -  Follow up - Falls prevention discussed - - -      Objective:   Vitals:   12/30/20 1039  BP: (!) 152/80  Pulse: 89  Resp: 16  Temp: 98 F (36.7 C)  TempSrc: Oral  SpO2: 99%  Weight: 132 lb 6.4 oz (60.1 kg)  Height: 5\' 6"  (1.676 m)    Body mass index is 21.37 kg/m.  Physical Exam  Recheck of her blood pressure by myself was 140/80 on the right with an adult cuff.  NAD, masked, pleasant HEENT - [place]/AT, sclera anicteric, PERRL,  EOMI, conj - non-inj'ed, positive glasses,  Neck - supple, no adenopathy, carotids 2+ and = without bruits bilat Car - RRR without m/g/r Pulm- RR and effort normal at rest, CTA without wheeze or rales Abd - soft, NT diffusely, Back - no CVA tenderness Ext - no LE edema, left arm in a sling Neuro/psychiatric - affect was not flat, appropriate with conversation             Alert and oriented          Was able to get up from the chair and up the step onto the exam table without assistance, sensation intact to light touch in the distal extremity             Speech and gait are normal   Results for orders placed or performed during the hospital encounter of 11/17/20  Basic metabolic panel  Result Value Ref Range   Sodium 134 (L) 135 - 145 mmol/L   Potassium 3.8 3.5 - 5.1 mmol/L   Chloride 100 98 - 111 mmol/L   CO2 24 22 - 32 mmol/L   Glucose, Bld 136 (H) 70 - 99 mg/dL   BUN 16 8 - 23 mg/dL   Creatinine, Ser 1.610.83 0.44 - 1.00 mg/dL   Calcium  8.7 (L) 8.9 - 10.3 mg/dL   GFR, Estimated >60 >60 mL/min   Anion gap 10 5 - 15  CBC  Result Value Ref Range   WBC 13.1 (H) 4.0 - 10.5 K/uL   RBC 3.17 (L) 3.87 - 5.11 MIL/uL   Hemoglobin 9.5 (L) 12.0 - 15.0 g/dL   HCT 27.2 (L) 36.0 - 46.0 %   MCV 85.8 80.0 - 100.0 fL   MCH 30.0 26.0 - 34.0 pg   MCHC 34.9 30.0 - 36.0 g/dL   RDW 14.0 11.5 - 15.5 %   Platelets 222 150 - 400 K/uL   nRBC 0.0 0.0 - 0.2 %  Surgical pathology  Result Value Ref Range   SURGICAL PATHOLOGY      SURGICAL PATHOLOGY CASE: [id number] PATIENT: [facility] Surgical Pathology Report     Specimen Submitted: A. Left humeral head  Clinical History: Rotator cuff tendinitis, left M75.82, Primary osteoarthritis of left shoulder M19.012      DIAGNOSIS: A. HUMERAL HEAD, LEFT; ARTHROPLASTY: - OSTEOARTHROSIS. - TRILINEAGE HEMATOPOIESIS. - NEGATIVE FOR MALIGNANCY.  GROSS DESCRIPTION: A. Labeled: Left humeral head Received: In formalin Size of specimen:       Head -5.5 x 5.2 x 1.7 cm Articular surface: Moderately eroded and roughened with areas of eburnation noted Cut surface: Pink-yellow with no masses or cysts grossly noted Other findings: None  Block summary: 1 -representative section  Tissue decalcification: Submitted after decalcification   Final Diagnosis performed by Quay Burow, MD.   Electronically signed 11/19/2020 8:53:23AM The electronic signature indicates that the named Attending Pathologist has evaluated the specimen Technical compo nent performed at [facility], [address] [place], [place] [address] Lab: [phone] Dir: Rush Farmer, MD, MMM  Professional component performed at [facility], [facility], [place], [place], [place] [address] Lab: [phone] Dir: Dellia Nims. Reuel Derby, MD        Assessment & Plan:   1. Hypertension goal BP (blood pressure) < 140/90 Noted some concerns with her blood pressures being elevated on home checks in the recent past, and she did not bring in the machine she has been checking the blood pressures at home with today.  Would really like to have that machine checked for accuracy.  Her blood pressures in the office were borderline high systolic.  She has been in increased pain after her shoulder surgery, and did note that pain can often elevate blood pressures. Continue her current medication regimen felt best presently, and she did see orthopedics yesterday and was placed on a steroid to help in management. Recommended continuing to check her blood pressures at home starting after a few days and hopefully with the pain lessened, and if her numbers are remaining higher, with the systolic reading above [id number] or the diastolic reading above 90 with any consistency, did recommend she follow-up and bring the machine with her for a follow-up visit before she heads [place] in February to ensure her blood pressures are remaining well controlled.  2. Hyperlipidemia LDL goal <100 On  a low-dose statin presently and continue  3. Primary osteoarthritis involving multiple joints/status post left shoulder surgery As above noted, do feel pain can raise blood pressures, and currently is followed by orthopedics and will continue.   4. CKD (chronic kidney disease) stage 2, GFR 60-89 ml/min Last lab check in early December was good, and continue to monitor.  Tentatively, we will put a follow-up in 3 months time, follow-up sooner as needed and as noted above. She is aware  that the follow-up will likely be with a new provider as I will be leaving this practice prior to her planned follow-up.   Also recommended a follow-up with her eye doctor again, to help assess her vision concerns.   Towanda Malkin, MD 12/30/20 11:04 AM

## 2020-12-29 DIAGNOSIS — M25512 Pain in left shoulder: Secondary | ICD-10-CM | POA: Diagnosis not present

## 2020-12-30 ENCOUNTER — Encounter: Payer: Self-pay | Admitting: Internal Medicine

## 2020-12-30 ENCOUNTER — Other Ambulatory Visit: Payer: Self-pay

## 2020-12-30 ENCOUNTER — Ambulatory Visit (INDEPENDENT_AMBULATORY_CARE_PROVIDER_SITE_OTHER): Payer: Medicare HMO | Admitting: Internal Medicine

## 2020-12-30 VITALS — BP 152/80 | HR 89 | Temp 98.0°F | Resp 16 | Ht 66.0 in | Wt 132.4 lb

## 2020-12-30 DIAGNOSIS — E785 Hyperlipidemia, unspecified: Secondary | ICD-10-CM | POA: Diagnosis not present

## 2020-12-30 DIAGNOSIS — N182 Chronic kidney disease, stage 2 (mild): Secondary | ICD-10-CM

## 2020-12-30 DIAGNOSIS — I10 Essential (primary) hypertension: Secondary | ICD-10-CM

## 2020-12-30 DIAGNOSIS — M159 Polyosteoarthritis, unspecified: Secondary | ICD-10-CM

## 2020-12-30 DIAGNOSIS — M8949 Other hypertrophic osteoarthropathy, multiple sites: Secondary | ICD-10-CM | POA: Diagnosis not present

## 2020-12-30 DIAGNOSIS — M25512 Pain in left shoulder: Secondary | ICD-10-CM | POA: Diagnosis not present

## 2021-01-01 DIAGNOSIS — M25512 Pain in left shoulder: Secondary | ICD-10-CM | POA: Diagnosis not present

## 2021-01-01 DIAGNOSIS — M6281 Muscle weakness (generalized): Secondary | ICD-10-CM | POA: Diagnosis not present

## 2021-01-01 DIAGNOSIS — M25612 Stiffness of left shoulder, not elsewhere classified: Secondary | ICD-10-CM | POA: Diagnosis not present

## 2021-01-01 DIAGNOSIS — Z96612 Presence of left artificial shoulder joint: Secondary | ICD-10-CM | POA: Diagnosis not present

## 2021-01-05 DIAGNOSIS — H524 Presbyopia: Secondary | ICD-10-CM | POA: Diagnosis not present

## 2021-01-06 ENCOUNTER — Other Ambulatory Visit: Payer: Self-pay

## 2021-01-06 DIAGNOSIS — M25612 Stiffness of left shoulder, not elsewhere classified: Secondary | ICD-10-CM | POA: Diagnosis not present

## 2021-01-06 DIAGNOSIS — M25512 Pain in left shoulder: Secondary | ICD-10-CM | POA: Diagnosis not present

## 2021-01-06 DIAGNOSIS — M5412 Radiculopathy, cervical region: Secondary | ICD-10-CM

## 2021-01-06 DIAGNOSIS — M6281 Muscle weakness (generalized): Secondary | ICD-10-CM | POA: Diagnosis not present

## 2021-01-06 DIAGNOSIS — Z96612 Presence of left artificial shoulder joint: Secondary | ICD-10-CM | POA: Diagnosis not present

## 2021-01-06 MED ORDER — TIZANIDINE HCL 4 MG PO TABS: 4.0000 mg | ORAL_TABLET | Freq: Three times a day (TID) | ORAL | 0 refills | Status: AC | PRN

## 2021-01-11 ENCOUNTER — Telehealth: Payer: Self-pay | Admitting: Internal Medicine

## 2021-01-11 NOTE — Telephone Encounter (Signed)
Copied from [place] [id number]. Topic: Medicare AWV >> Jan 11, 2021  2:40 PM Cher Nakai R wrote: Reason for CRM:   Left message for patient to call back and schedule Medicare Annual Wellness Visit (AWV) in office.   If unable to come into the office for AWV,  please offer to do virtually or by telephone.  Last AWV 10/01/2019  Please schedule at anytime with [place].  40 minute appointment  Any questions, please contact me at [phone]

## 2021-01-15 DIAGNOSIS — M25612 Stiffness of left shoulder, not elsewhere classified: Secondary | ICD-10-CM | POA: Diagnosis not present

## 2021-01-15 DIAGNOSIS — M6281 Muscle weakness (generalized): Secondary | ICD-10-CM | POA: Diagnosis not present

## 2021-01-15 DIAGNOSIS — Z96612 Presence of left artificial shoulder joint: Secondary | ICD-10-CM | POA: Diagnosis not present

## 2021-01-15 DIAGNOSIS — M25512 Pain in left shoulder: Secondary | ICD-10-CM | POA: Diagnosis not present

## 2021-01-22 DIAGNOSIS — Z96612 Presence of left artificial shoulder joint: Secondary | ICD-10-CM | POA: Diagnosis not present

## 2021-01-22 DIAGNOSIS — M19012 Primary osteoarthritis, left shoulder: Secondary | ICD-10-CM | POA: Diagnosis not present

## 2021-01-25 ENCOUNTER — Telehealth: Payer: Self-pay

## 2021-01-25 DIAGNOSIS — I10 Essential (primary) hypertension: Secondary | ICD-10-CM

## 2021-01-25 MED ORDER — METOPROLOL SUCCINATE ER 100 MG PO TB24: 100.0000 mg | ORAL_TABLET | Freq: Every day | ORAL | 0 refills | Status: DC

## 2021-01-25 NOTE — Telephone Encounter (Signed)
rx sent

## 2021-01-28 DIAGNOSIS — Z96612 Presence of left artificial shoulder joint: Secondary | ICD-10-CM | POA: Diagnosis not present

## 2021-01-28 DIAGNOSIS — M19012 Primary osteoarthritis, left shoulder: Secondary | ICD-10-CM | POA: Diagnosis not present

## 2021-02-01 DIAGNOSIS — Z96612 Presence of left artificial shoulder joint: Secondary | ICD-10-CM | POA: Diagnosis not present

## 2021-02-01 DIAGNOSIS — M19012 Primary osteoarthritis, left shoulder: Secondary | ICD-10-CM | POA: Diagnosis not present

## 2021-02-03 DIAGNOSIS — M19012 Primary osteoarthritis, left shoulder: Secondary | ICD-10-CM | POA: Diagnosis not present

## 2021-02-03 DIAGNOSIS — Z96612 Presence of left artificial shoulder joint: Secondary | ICD-10-CM | POA: Diagnosis not present

## 2021-02-08 DIAGNOSIS — M19012 Primary osteoarthritis, left shoulder: Secondary | ICD-10-CM | POA: Diagnosis not present

## 2021-02-08 DIAGNOSIS — Z96612 Presence of left artificial shoulder joint: Secondary | ICD-10-CM | POA: Diagnosis not present

## 2021-02-11 ENCOUNTER — Ambulatory Visit (INDEPENDENT_AMBULATORY_CARE_PROVIDER_SITE_OTHER): Payer: Medicare HMO

## 2021-02-11 DIAGNOSIS — Z0000 Encounter for general adult medical examination without abnormal findings: Secondary | ICD-10-CM | POA: Diagnosis not present

## 2021-02-11 DIAGNOSIS — M19012 Primary osteoarthritis, left shoulder: Secondary | ICD-10-CM | POA: Diagnosis not present

## 2021-02-11 DIAGNOSIS — Z96612 Presence of left artificial shoulder joint: Secondary | ICD-10-CM | POA: Diagnosis not present

## 2021-02-11 NOTE — Progress Notes (Signed)
Subjective:   Rachel James is a 75 y.o. female who presents for Medicare Annual (Subsequent) preventive examination.  Virtual Visit via Telephone Note  I connected with  [name] on 02/11/21 at 11:20 AM EST by telephone and verified that I am speaking with the correct person using two identifiers.  Location: Patient: home Provider: [place] Persons participating in the virtual visit: [place]   I discussed the limitations, risks, security and privacy concerns of performing an evaluation and management service by telephone and the availability of in person appointments. The patient expressed understanding and agreed to proceed.  Interactive audio and video telecommunications were attempted between this nurse and patient, however failed, due to patient having technical difficulties OR patient did not have access to video capability.  We continued and completed visit with audio only.  Some vital signs may be absent or patient reported.   Clemetine Marker, LPN    Review of Systems     Cardiac Risk Factors include: advanced age (>40men, >98 women);hypertension;dyslipidemia     Objective:    Today's Vitals   02/11/21 1137  PainSc: 2    There is no height or weight on file to calculate BMI.  Advanced Directives 02/11/2021 11/18/2020 11/17/2020 11/09/2020 06/10/2020 06/02/2020 11/18/2019  Does Patient Have a Medical Advance Directive? No No No No No No Yes  Type of Advance Directive - - - - - - Living will  Does patient want to make changes to medical advance directive? - - - - - - No - Patient declined  Copy of [place] in [place]  Would patient like information on creating a medical advance directive? No - Patient declined No - Patient declined No - Patient declined No - Patient declined No - Patient declined No - Patient declined No - Patient declined    Current Medications (verified) Outpatient Encounter Medications as of  02/11/2021  Medication Sig  . acetaminophen (TYLENOL) 500 MG tablet Take 1,000 mg by mouth 2 (two) times daily.  Marland Kitchen docusate sodium (COLACE) 100 MG capsule Take 100 mg by mouth daily as needed for mild constipation.   . famotidine (PEPCID) 40 MG tablet TAKE 1 TABLET (40 MG TOTAL) BY MOUTH 2 (TWO) TIMES DAILY AS NEEDED FOR HEARTBURN OR INDIGESTION.  . fluticasone (FLONASE) 50 MCG/ACT nasal spray Place 1 spray into both nostrils daily as needed for allergies.   . hydrochlorothiazide (MICROZIDE) 12.5 MG capsule Take 1 capsule (12.5 mg total) by mouth daily.  Marland Kitchen losartan (COZAAR) 100 MG tablet Take 1 tablet (100 mg total) by mouth daily.  . magnesium oxide (MAG-OX) 400 (241.3 Mg) MG tablet Take 400 mg by mouth at bedtime.   . metoprolol succinate (TOPROL-XL) 100 MG 24 hr tablet Take 1 tablet (100 mg total) by mouth daily. Take with or immediately following a meal.  . Multiple Vitamins-Minerals (MULTIVITAMIN PO) Take 1 tablet by mouth daily.  Marland Kitchen oxymetazoline (AFRIN) 0.05 % nasal spray Place 1 spray into both nostrils daily as needed for congestion.  Marland Kitchen Propylene Glycol-Glycerin (SOOTHE OP) Place 1 drop into both eyes 3 (three) times daily as needed (dry eyes).  . simvastatin (ZOCOR) 10 MG tablet Take 1 tablet (10 mg total) by mouth daily.  Marland Kitchen tiZANidine (ZANAFLEX) 4 MG tablet Take 1 tablet (4 mg total) by mouth every 8 (eight) hours as needed for muscle spasms.  . [DISCONTINUED] amoxicillin (AMOXIL) 500 MG capsule Take 2,000 mg by mouth  See admin instructions. Take 2000 mg by mouth prior to dental appointments (Patient not taking: Reported on 12/30/2020)  . [DISCONTINUED] aspirin EC 325 MG tablet Take 1 tablet (325 mg total) by mouth daily.  . [DISCONTINUED] carbamide peroxide (DEBROX) 6.5 % OTIC solution Place 5 drops into both ears 2 (two) times daily as needed (ear wax).  (Patient not taking: Reported on 12/30/2020)  . [DISCONTINUED] cholecalciferol (VITAMIN D) 1000 units tablet Take 1 tablet (1,000 Units  total) by mouth daily. (Patient not taking: Reported on 12/30/2020)  . [DISCONTINUED] HYDROcodone-acetaminophen (NORCO/VICODIN) 5-325 MG tablet Take 1-2 tablets by mouth every 4 (four) hours as needed for moderate pain (pain score 4-6).  . [DISCONTINUED] naproxen sodium (ALEVE) 220 MG tablet Take 220 mg by mouth 2 (two) times daily with a meal.  . [DISCONTINUED] ondansetron (ZOFRAN) 4 MG tablet Take 1 tablet (4 mg total) by mouth every 6 (six) hours as needed for nausea.  . [DISCONTINUED] ondansetron (ZOFRAN-ODT) 4 MG disintegrating tablet Take by mouth.   No facility-administered encounter medications on file as of 02/11/2021.    Allergies (verified) Codeine, Tramadol hcl, and Oxycodone   History: Past Medical History:  Diagnosis Date  . Allergy   . Arthritis    knees, lower back  . Cervical radiculitis   . Chronic radicular low back pain   . CKD (chronic kidney disease) stage 2, GFR 60-89 ml/min 07/11/2017  . Degenerative disc disease, lumbar   . GERD (gastroesophageal reflux disease)   . Hyperlipidemia LDL goal <100   . Hypertension   . Osteopenia 06/22/2017   June 2016 DEXA  . Pneumonia 2012  . PONV (postoperative nausea and vomiting)    in past.  none recently   Past Surgical History:  Procedure Laterality Date  .  THUMB SURGERY Left 2008   joint replacement  . APPENDECTOMY    . BREAST BIOPSY Right 1992   neg  . BREAST LUMPECTOMY Right 1992  . BUNIONECTOMY Bilateral 1978, 2002  . CERVICAL DISCECTOMY  09/18/2012   C4-C5 AND C6-C7 ACDF   . COLONOSCOPY    . JOINT REPLACEMENT  2008   thumb  . JOINT REPLACEMENT Right 2019   shoulder  . KNEE ARTHROPLASTY Right 11/04/2019   Procedure: COMPUTER ASSISTED TOTAL KNEE ARTHROPLASTY;  Surgeon: Dereck Leep, MD;  Location: [facility];  Service: Orthopedics;  Laterality: Right;  . REVERSE SHOULDER ARTHROPLASTY Left 11/17/2020   Procedure: REVERSE SHOULDER ARTHROPLASTY;  Surgeon: Corky Mull, MD;  Location: [facility];  Service:  Orthopedics;  Laterality: Left;  . SHOULDER ARTHROSCOPY WITH ROTATOR CUFF REPAIR AND OPEN BICEPS TENODESIS Right 12/20/2017   Procedure: SHOULDER ARTHROSCOPY WITH DEBRIDEMENT, DECOMPRESSION AND  BICEPS TENODESIS;  Surgeon: Corky Mull, MD;  Location: [place];  Service: Orthopedics;  Laterality: Right;  . SPINE SURGERY  2013   cervical  . TOTAL HIP ARTHROPLASTY Left 06/10/2020   Procedure: TOTAL HIP ARTHROPLASTY;  Surgeon: Dereck Leep, MD;  Location: [facility];  Service: Orthopedics;  Laterality: Left;  . TOTAL SHOULDER ARTHROPLASTY Right 08/09/2018   Procedure: RIGHT TOTAL SHOULDER ARTHROPLASTY;  Surgeon: Justice Britain, MD;  Location: [place];  Service: Orthopedics;  Laterality: Right;  . TUBAL LIGATION Bilateral    Family History  Problem Relation Age of Onset  . Heart disease Mother   . Heart attack Mother   . Arthritis Mother   . Heart disease Father   . Heart disease Sister   . Arthritis Sister   . COPD Sister   .  Heart disease Brother   . Heart failure Brother   . Memory loss Brother   . Arthritis Brother   . Arthritis Maternal Grandmother   . Diabetes Paternal Grandmother   . Pulmonary embolism Sister   . Breast cancer Neg Hx    Social History   Socioeconomic History  . Marital status: Divorced    Spouse name: Not on file  . Number of children: 3  . Years of education: Not on file  . Highest education level: Associate degree: occupational, Hotel manager, or vocational program  Occupational History  . Occupation: retired  Tobacco Use  . Smoking status: Never Smoker  . Smokeless tobacco: Never Used  . Tobacco comment: smoking cessation materials not required  Vaping Use  . Vaping Use: Never used  Substance and Sexual Activity  . Alcohol use: Not Currently    Alcohol/week: 0.0 standard drinks    Comment: alcohol raises BP  . Drug use: No  . Sexual activity: Not Currently    Birth control/protection: Post-menopausal  Other Topics Concern  . Not on file   Social History Narrative  . Not on file   Social Determinants of Health   Financial Resource Strain: Low Risk   . Difficulty of Paying Living Expenses: Not hard at all  Food Insecurity: No Food Insecurity  . Worried About Charity fundraiser in the Last Year: Never true  . Ran Out of Food in the Last Year: Never true  Transportation Needs: No Transportation Needs  . Lack of Transportation (Medical): No  . Lack of Transportation (Non-Medical): No  Physical Activity: Inactive  . Days of Exercise per Week: 0 days  . Minutes of Exercise per Session: 0 min  Stress: No Stress Concern Present  . Feeling of Stress : Not at all  Social Connections: Moderately Isolated  . Frequency of Communication with Friends and Family: More than three times a week  . Frequency of Social Gatherings with Friends and Family: Twice a week  . Attends Religious Services: More than 4 times per year  . Active Member of Clubs or Organizations: No  . Attends Archivist Meetings: Never  . Marital Status: Divorced    Tobacco Counseling Counseling given: Not Answered Comment: smoking cessation materials not required   Clinical Intake:  Pre-visit preparation completed: Yes  Pain : 0-10 Pain Score: 2  Pain Type: Chronic pain Pain Location: Shoulder Pain Orientation: Left Pain Descriptors / Indicators: Aching,Sore Pain Onset: More than a month ago Pain Frequency: Constant     Nutritional Risks: None Diabetes: No  How often do you need to have someone help you when you read instructions, pamphlets, or other written materials from your doctor or pharmacy?: 1 - Never    Interpreter Needed?: No  Information entered by :: Clemetine Marker LPN   Activities of Daily Living In your present state of health, do you have any difficulty performing the following activities: 02/11/2021 12/30/2020  Hearing? Y Y  Comment declines hearing aids -  Vision? N N  Difficulty concentrating or making  decisions? N N  Walking or climbing stairs? Y Y  Dressing or bathing? N N  Doing errands, shopping? N N  Preparing Food and eating ? N -  Using the Toilet? N -  In the past six months, have you accidently leaked urine? Y -  Comment wears liners for protection -  Do you have problems with loss of bowel control? N -  Managing your Medications? N -  Managing your Finances? N -  Housekeeping or managing your Housekeeping? N -  Some recent data might be hidden    Patient Care Team: [place] as PCP - General (Family Medicine) Beverly Gust, MD as Consulting Physician (Otolaryngology) Justice Britain, MD as Consulting Physician (Orthopedic Surgery) Neldon Labella, RN as Case Manager  Indicate any recent Medical Services you may have received from other than [facility] providers in the past year (date may be approximate).     Assessment:   This is a routine wellness examination for Z[place].  Hearing/Vision screen  Hearing Screening   125Hz  250Hz  500Hz  1000Hz  2000Hz  3000Hz  4000Hz  6000Hz  8000Hz   Right ear:           Left ear:           Comments: Pt c/o mild hearing loss, declines hearing aids; has hearing evaluation scheduled for 02/2021   Dietary issues and exercise activities discussed: Current Exercise Habits: The patient does not participate in regular exercise at present, Exercise limited by: orthopedic condition(s)  Goals    .  Chronic Disease Management      CARE PLAN ENTRY (see longitudinal plan of care for additional care plan information)  Current Barriers:  . Chronic Disease Management support and education needs related to HTN, GERD, CKD and HLD.  Case Manager Clinical Goal(s):  Over the next 120 days: Marland Kitchen Monitor BP and record readings. . Continue taking medications as prescribed. . Attend medical appointments as scheduled. . Follow precautions and activity restrictions as outlined by Orthopedic team to prevent falls and  injuries.  Interventions:  . Inter-disciplinary care team collaboration (see longitudinal plan of care) . Reviewed medications. Encouraged to continue taking as prescribed and notify care team with concerns regarding prescription costs. . Discussed established BP parameters and indications for notifying a provider. Encouraged to monitor routinely if unable to monitor daily and record readings.  . Discussed safety and fall prevention measures. Reviewed treatment recommendations and activity restrictions. Encouraged to use an assistive device as needed when ambulating. Currently attending Physical Therapy sessions d/t hip surgery in June. Reports being evaluated by Neurosurgery on yesterday d/t concerns with her lower back. Pending insurance provider's approval for EMG. . Reviewed scheduled/upcoming appointments. Encouraged to continue attending as scheduled to prevent delays in care. Encouraged to notify care management team with concerns regarding transportation.  . Discussed plan for ongoing care management and follow-up. Provided direct contact information for care management team. Ms. Sirek is doing well and functions independently in her home. We will plan to follow-up within the next few months to address the Neurosurgery team's treatment plan. She will be due for her annual PCP visit in within the next few months but may be out of the area in November. The clinic was contacted. Appointment scheduled for 10/12/20.   Patient Self Care Activities:  . Self administers medications  . Attends scheduled provider appointments . Calls pharmacy for medication refills . Performs ADL's independently . Performs IADL's independently . Calls provider office for new concerns or questions   Initial goal documentation     .  DIET - EAT MORE FRUITS AND VEGETABLES      Recommend to eat 3 small healthy meals and at least 2 healthy snacks per day.    Marland Kitchen  DIET - REDUCE SUGAR INTAKE (pt-stated)    .  Increase  physical activity      Increase physical activity as tolerated once knee replacement therapy complete      Depression  Screen PHQ 2/9 Scores 02/11/2021 12/30/2020 04/29/2020 04/07/2020 02/28/2020 01/15/2020 10/01/2019  PHQ - 2 Score 0 0 0 0 0 0 0  PHQ- 9 Score - - 1 0 1 1 -    Fall Risk Fall Risk  02/11/2021 12/30/2020 08/05/2020 04/29/2020 04/07/2020  Falls in the past year? 1 0 0 0 0  Number falls in past yr: 1 0 - 0 0  Injury with Fall? 0 0 - 0 0  Risk for fall due to : History of fall(s) - Orthopedic patient;Medication side effect - -  Risk for fall due to: Comment - - - - -  Follow up Falls prevention discussed - Falls prevention discussed - -    FALL RISK PREVENTION PERTAINING TO THE HOME:  Any stairs in or around the home? Yes  If so, are there any without handrails? No  Home free of loose throw rugs in walkways, pet beds, electrical cords, etc? Yes  Adequate lighting in your home to reduce risk of falls? Yes   ASSISTIVE DEVICES UTILIZED TO PREVENT FALLS:  Life alert? No  Use of a cane, walker or w/c? No  Grab bars in the bathroom? Yes  Shower chair or bench in shower? Yes  Elevated toilet seat or a handicapped toilet? Yes   TIMED UP AND GO:  Was the test performed? No . Telephonic visit.   Cognitive Function: Normal cognitive status assessed by direct observation by this Nurse Health Advisor. No abnormalities found.       6CIT Screen 10/01/2019 09/27/2018  What Year? 0 points 0 points  What month? 0 points 0 points  What time? 0 points 0 points  Count back from 20 0 points 0 points  Months in reverse 0 points 0 points  Repeat phrase 0 points 0 points  Total Score 0 0    Immunizations Immunization History  Administered Date(s) Administered  . Fluad Quad(high Dose 65+) 08/12/2019  . Influenza, High Dose Seasonal PF 09/17/2015, 08/16/2016, 09/26/2017, 09/27/2018, 11/11/2020  . Influenza,inj,quad, With Preservative 09/18/2017, 09/18/2018  . Moderna Sars-Covid-2  Vaccination 03/17/2020, 04/14/2020, 11/13/2020  . Pneumococcal Conjugate-13 04/21/2015  . Pneumococcal Polysaccharide-23 08/16/2016  . Tdap 07/12/2014  . Zoster Recombinat (Shingrix) 12/15/2018, 12/24/2019    TDAP status: Up to date  Flu Vaccine status: Up to date  Pneumococcal vaccine status: Up to date  Covid-19 vaccine status: Completed vaccines  Qualifies for Shingles Vaccine? Yes   Zostavax completed Yes   Shingrix Completed?: Yes  Screening Tests Health Maintenance  Topic Date Due  . COVID-19 Vaccine (4 - Booster for Moderna series) 05/13/2021  . DEXA SCAN  10/27/2021  . MAMMOGRAM  11/02/2021  . COLONOSCOPY (Pts 45-64yrs Insurance coverage will need to be confirmed)  01/19/2022  . TETANUS/TDAP  07/12/2024  . INFLUENZA VACCINE  Completed  . Hepatitis C Screening  Completed  . PNA vac Low Risk Adult  Completed    Health Maintenance  There are no preventive care reminders to display for this patient.  Colorectal cancer screening: Type of screening: Colonoscopy. Completed 01/20/12. Repeat every 10 years  Mammogram status: Completed 11/02/20. Repeat every year  Bone Density status: Completed 10/28/19. Results reflect: Bone density results: OSTEOPENIA. Repeat every 2 years.  Lung Cancer Screening: (Low Dose CT Chest recommended if Age 65-80 years, 30 pack-year currently smoking OR have quit w/in 15years.) does not qualify.   Additional Screening:  Hepatitis C Screening: does qualify; Completed 08/23/16  Vision Screening: Recommended annual ophthalmology exams for early detection of glaucoma and  other disorders of the eye. Is the patient up to date with their annual eye exam?  Yes  Who is the provider or what is the name of the office in which the patient attends annual eye exams? Dr. Ellin Mayhew  Dental Screening: Recommended annual dental exams for proper oral hygiene  Community Resource Referral / Chronic Care Management: CRR required this visit?  No   CCM required  this visit?  No      Plan:     I have personally reviewed and noted the following in the patient's chart:   . Medical and social history . Use of alcohol, tobacco or illicit drugs  . Current medications and supplements . Functional ability and status . Nutritional status . Physical activity . Advanced directives . List of other physicians . Hospitalizations, surgeries, and ER visits in previous 12 months . Vitals . Screenings to include cognitive, depression, and falls . Referrals and appointments  In addition, I have reviewed and discussed with patient certain preventive protocols, quality metrics, and best practice recommendations. A written personalized care plan for preventive services as well as general preventive health recommendations were provided to patient.     Clemetine Marker, LPN   [id number]/0354   Nurse Notes: pt c/o swelling in feet and ankles, more on right side for approx the last 6 months but goes away overnight. Pt also c/o red blotches on right leg from knee to ankle. Pt denies pain or itching, only redness. Pt is in [place] until April. Pt advised to see urgent care if sxs worsen of persist and schedule office visit for follow up when she returns.

## 2021-02-11 NOTE — Patient Instructions (Signed)
Rachel James , Thank you for taking time to come for your Medicare Wellness Visit. I appreciate your ongoing commitment to your health goals. Please review the following plan we discussed and let me know if I can assist you in the future.   Screening recommendations/referrals: Colonoscopy: done 01/20/12. Repeat in 2023 Mammogram: done 11/02/20 Bone Density: done 10/28/19 Recommended yearly ophthalmology/optometry visit for glaucoma screening and checkup Recommended yearly dental visit for hygiene and checkup  Vaccinations: Influenza vaccine: done 11/11/20 Pneumococcal vaccine: done 08/16/16 Tdap vaccine: done 07/12/14 Shingles vaccine: done 12/15/18 & 12/24/19   Covid-19:done 03/17/20, 04/14/20 & 11/13/20  Advanced directives: Please bring a copy of your health care power of attorney and living will to the office at your convenience once you have completed those documents.   Conditions/risks identified: recommend increasing physical activity as tolerated  Next appointment: Follow up in one year for your annual wellness visit.    Preventive Care 11 Years and Older, Female Preventive care refers to lifestyle choices and visits with your health care provider that can promote health and wellness. What does preventive care include?  A yearly physical exam. This is also called an annual well check.  Dental exams once or twice a year.  Routine eye exams. Ask your health care provider how often you should have your eyes checked.  Personal lifestyle choices, including:  Daily care of your teeth and gums.  Regular physical activity.  Eating a healthy diet.  Avoiding tobacco and drug use.  Limiting alcohol use.  Practicing safe sex.  Taking low-dose aspirin every day.  Taking vitamin and mineral supplements as recommended by your health care provider. What happens during an annual well check? The services and screenings done by your health care provider during your annual well check will  depend on your age, overall health, lifestyle risk factors, and family history of disease. Counseling  Your health care provider may ask you questions about your:  Alcohol use.  Tobacco use.  Drug use.  Emotional well-being.  Home and relationship well-being.  Sexual activity.  Eating habits.  History of falls.  Memory and ability to understand (cognition).  Work and work Statistician.  Reproductive health. Screening  You may have the following tests or measurements:  Height, weight, and BMI.  Blood pressure.  Lipid and cholesterol levels. These may be checked every 5 years, or more frequently if you are over 90 years old.  Skin check.  Lung cancer screening. You may have this screening every year starting at age 62 if you have a 30-pack-year history of smoking and currently smoke or have quit within the past 15 years.  Fecal occult blood test (FOBT) of the stool. You may have this test every year starting at age 85.  Flexible sigmoidoscopy or colonoscopy. You may have a sigmoidoscopy every 5 years or a colonoscopy every 10 years starting at age 50.  Hepatitis C blood test.  Hepatitis B blood test.  Sexually transmitted disease (STD) testing.  Diabetes screening. This is done by checking your blood sugar (glucose) after you have not eaten for a while (fasting). You may have this done every 1-3 years.  Bone density scan. This is done to screen for osteoporosis. You may have this done starting at age 57.  Mammogram. This may be done every 1-2 years. Talk to your health care provider about how often you should have regular mammograms. Talk with your health care provider about your test results, treatment options, and if necessary, the need  for more tests. Vaccines  Your health care provider may recommend certain vaccines, such as:  Influenza vaccine. This is recommended every year.  Tetanus, diphtheria, and acellular pertussis (Tdap, Td) vaccine. You may need a  Td booster every 10 years.  Zoster vaccine. You may need this after age 42.  Pneumococcal 13-valent conjugate (PCV13) vaccine. One dose is recommended after age 40.  Pneumococcal polysaccharide (PPSV23) vaccine. One dose is recommended after age 22. Talk to your health care provider about which screenings and vaccines you need and how often you need them. This information is not intended to replace advice given to you by your health care provider. Make sure you discuss any questions you have with your health care provider. Document Released: 01/01/2016 Document Revised: 08/24/2016 Document Reviewed: 10/06/2015 Elsevier Interactive Patient Education  2017 [place] Prevention in the Home Falls can cause injuries. They can happen to people of all ages. There are many things you can do to make your home safe and to help prevent falls. What can I do on the outside of my home?  Regularly fix the edges of walkways and driveways and fix any cracks.  Remove anything that might make you trip as you walk through a door, such as a raised step or threshold.  Trim any bushes or trees on the path to your home.  Use bright outdoor lighting.  Clear any walking paths of anything that might make someone trip, such as rocks or tools.  Regularly check to see if handrails are loose or broken. Make sure that both sides of any steps have handrails.  Any raised decks and porches should have guardrails on the edges.  Have any leaves, snow, or ice cleared regularly.  Use sand or salt on walking paths during winter.  Clean up any spills in your garage right away. This includes oil or grease spills. What can I do in the bathroom?  Use night lights.  Install grab bars by the toilet and in the tub and shower. Do not use towel bars as grab bars.  Use non-skid mats or decals in the tub or shower.  If you need to sit down in the shower, use a plastic, non-slip stool.  Keep the floor dry. Clean  up any water that spills on the floor as soon as it happens.  Remove soap buildup in the tub or shower regularly.  Attach bath mats securely with double-sided non-slip rug tape.  Do not have throw rugs and other things on the floor that can make you trip. What can I do in the bedroom?  Use night lights.  Make sure that you have a light by your bed that is easy to reach.  Do not use any sheets or blankets that are too big for your bed. They should not hang down onto the floor.  Have a firm chair that has side arms. You can use this for support while you get dressed.  Do not have throw rugs and other things on the floor that can make you trip. What can I do in the kitchen?  Clean up any spills right away.  Avoid walking on wet floors.  Keep items that you use a lot in easy-to-reach places.  If you need to reach something above you, use a strong step stool that has a grab bar.  Keep electrical cords out of the way.  Do not use floor polish or wax that makes floors slippery. If you must use wax, use  non-skid floor wax.  Do not have throw rugs and other things on the floor that can make you trip. What can I do with my stairs?  Do not leave any items on the stairs.  Make sure that there are handrails on both sides of the stairs and use them. Fix handrails that are broken or loose. Make sure that handrails are as long as the stairways.  Check any carpeting to make sure that it is firmly attached to the stairs. Fix any carpet that is loose or worn.  Avoid having throw rugs at the top or bottom of the stairs. If you do have throw rugs, attach them to the floor with carpet tape.  Make sure that you have a light switch at the top of the stairs and the bottom of the stairs. If you do not have them, ask someone to add them for you. What else can I do to help prevent falls?  Wear shoes that:  Do not have high heels.  Have rubber bottoms.  Are comfortable and fit you well.  Are  closed at the toe. Do not wear sandals.  If you use a stepladder:  Make sure that it is fully opened. Do not climb a closed stepladder.  Make sure that both sides of the stepladder are locked into place.  Ask someone to hold it for you, if possible.  Clearly mark and make sure that you can see:  Any grab bars or handrails.  First and last steps.  Where the edge of each step is.  Use tools that help you move around (mobility aids) if they are needed. These include:  Canes.  Walkers.  Scooters.  Crutches.  Turn on the lights when you go into a dark area. Replace any light bulbs as soon as they burn out.  Set up your furniture so you have a clear path. Avoid moving your furniture around.  If any of your floors are uneven, fix them.  If there are any pets around you, be aware of where they are.  Review your medicines with your doctor. Some medicines can make you feel dizzy. This can increase your chance of falling. Ask your doctor what other things that you can do to help prevent falls. This information is not intended to replace advice given to you by your health care provider. Make sure you discuss any questions you have with your health care provider. Document Released: 10/01/2009 Document Revised: 05/12/2016 Document Reviewed: 01/09/2015 Elsevier Interactive Patient Education  2017 [employer].

## 2021-02-15 DIAGNOSIS — Z96612 Presence of left artificial shoulder joint: Secondary | ICD-10-CM | POA: Diagnosis not present

## 2021-02-15 DIAGNOSIS — M19012 Primary osteoarthritis, left shoulder: Secondary | ICD-10-CM | POA: Diagnosis not present

## 2021-02-18 DIAGNOSIS — M19012 Primary osteoarthritis, left shoulder: Secondary | ICD-10-CM | POA: Diagnosis not present

## 2021-02-18 DIAGNOSIS — Z96612 Presence of left artificial shoulder joint: Secondary | ICD-10-CM | POA: Diagnosis not present

## 2021-02-22 DIAGNOSIS — Z96612 Presence of left artificial shoulder joint: Secondary | ICD-10-CM | POA: Diagnosis not present

## 2021-02-22 DIAGNOSIS — M19012 Primary osteoarthritis, left shoulder: Secondary | ICD-10-CM | POA: Diagnosis not present

## 2021-02-25 ENCOUNTER — Telehealth: Payer: Self-pay

## 2021-02-25 DIAGNOSIS — E785 Hyperlipidemia, unspecified: Secondary | ICD-10-CM

## 2021-02-25 DIAGNOSIS — Z96612 Presence of left artificial shoulder joint: Secondary | ICD-10-CM | POA: Diagnosis not present

## 2021-02-25 DIAGNOSIS — I10 Essential (primary) hypertension: Secondary | ICD-10-CM

## 2021-02-25 DIAGNOSIS — M19012 Primary osteoarthritis, left shoulder: Secondary | ICD-10-CM | POA: Diagnosis not present

## 2021-02-25 NOTE — Telephone Encounter (Signed)
Patient called.  Patient aware.  

## 2021-02-25 NOTE — Telephone Encounter (Signed)
Copied from [place] [id number]. Topic: General - Inquiry >> Feb 25, 2021  9:12 AM Greggory Keen D wrote: Reason for CRM: Pt called saying she has a stiff neck muscle pain from time to time.  She is in [place] right now and wants to know if some one could send a prednisone dose pack.  She said Dr. Roxan Hockey usually does this.  She wants it sent to [facility], [place]. [place]  CB#  [phone]

## 2021-02-25 NOTE — Telephone Encounter (Signed)
She'll unfortunately need to be evaluated in person for an exam. If she can't make it to our office and is bothering her considerably, I recommend an urgent care visit where she is staying.

## 2021-03-02 DIAGNOSIS — M19012 Primary osteoarthritis, left shoulder: Secondary | ICD-10-CM | POA: Diagnosis not present

## 2021-03-02 DIAGNOSIS — Z96612 Presence of left artificial shoulder joint: Secondary | ICD-10-CM | POA: Diagnosis not present

## 2021-03-04 DIAGNOSIS — M19012 Primary osteoarthritis, left shoulder: Secondary | ICD-10-CM | POA: Diagnosis not present

## 2021-03-04 DIAGNOSIS — Z96612 Presence of left artificial shoulder joint: Secondary | ICD-10-CM | POA: Diagnosis not present

## 2021-03-08 DIAGNOSIS — Z96612 Presence of left artificial shoulder joint: Secondary | ICD-10-CM | POA: Diagnosis not present

## 2021-03-08 DIAGNOSIS — M19012 Primary osteoarthritis, left shoulder: Secondary | ICD-10-CM | POA: Diagnosis not present

## 2021-03-09 ENCOUNTER — Other Ambulatory Visit: Payer: Self-pay

## 2021-03-09 DIAGNOSIS — I10 Essential (primary) hypertension: Secondary | ICD-10-CM

## 2021-03-09 DIAGNOSIS — E785 Hyperlipidemia, unspecified: Secondary | ICD-10-CM

## 2021-03-09 MED ORDER — LOSARTAN POTASSIUM 100 MG PO TABS: 100.0000 mg | ORAL_TABLET | Freq: Every day | ORAL | 3 refills | Status: DC

## 2021-03-09 MED ORDER — SIMVASTATIN 10 MG PO TABS: 10.0000 mg | ORAL_TABLET | Freq: Every day | ORAL | 3 refills | Status: DC

## 2021-03-10 MED ORDER — HYDROCHLOROTHIAZIDE 12.5 MG PO CAPS: 12.5000 mg | ORAL_CAPSULE | Freq: Every day | ORAL | 0 refills | Status: DC

## 2021-03-10 NOTE — Addendum Note (Signed)
Addended by: Chilton Greathouse on: 03/10/2021 09:23 AM   Modules accepted: Orders

## 2021-03-11 ENCOUNTER — Ambulatory Visit: Payer: Self-pay

## 2021-03-11 DIAGNOSIS — M19012 Primary osteoarthritis, left shoulder: Secondary | ICD-10-CM | POA: Diagnosis not present

## 2021-03-11 DIAGNOSIS — Z96612 Presence of left artificial shoulder joint: Secondary | ICD-10-CM | POA: Diagnosis not present

## 2021-03-15 DIAGNOSIS — Z96612 Presence of left artificial shoulder joint: Secondary | ICD-10-CM | POA: Diagnosis not present

## 2021-03-15 DIAGNOSIS — M19012 Primary osteoarthritis, left shoulder: Secondary | ICD-10-CM | POA: Diagnosis not present

## 2021-03-18 DIAGNOSIS — Z96612 Presence of left artificial shoulder joint: Secondary | ICD-10-CM | POA: Diagnosis not present

## 2021-03-18 DIAGNOSIS — M19012 Primary osteoarthritis, left shoulder: Secondary | ICD-10-CM | POA: Diagnosis not present

## 2021-03-22 DIAGNOSIS — Z96612 Presence of left artificial shoulder joint: Secondary | ICD-10-CM | POA: Diagnosis not present

## 2021-03-22 DIAGNOSIS — M19012 Primary osteoarthritis, left shoulder: Secondary | ICD-10-CM | POA: Diagnosis not present

## 2021-03-22 NOTE — Chronic Care Management (AMB) (Signed)
Outreach will be rescheduled. Please disregard this encounter.

## 2021-03-25 DIAGNOSIS — Z96612 Presence of left artificial shoulder joint: Secondary | ICD-10-CM | POA: Diagnosis not present

## 2021-03-25 DIAGNOSIS — M19012 Primary osteoarthritis, left shoulder: Secondary | ICD-10-CM | POA: Diagnosis not present

## 2021-03-27 IMAGING — DX DG HIP (WITH OR WITHOUT PELVIS) 1V PORT*L*
2 series · 3 of 3 positions shown · non-contrast
Comparison: None.

CLINICAL DATA: Status post left hip arthroplasty

EXAM:
DG HIP (WITH OR WITHOUT PELVIS) 1V PORT LEFT

[Series 1: pelvis ap · 0.14mm/px · 2 of 2 slices shown]
[im 1/2]
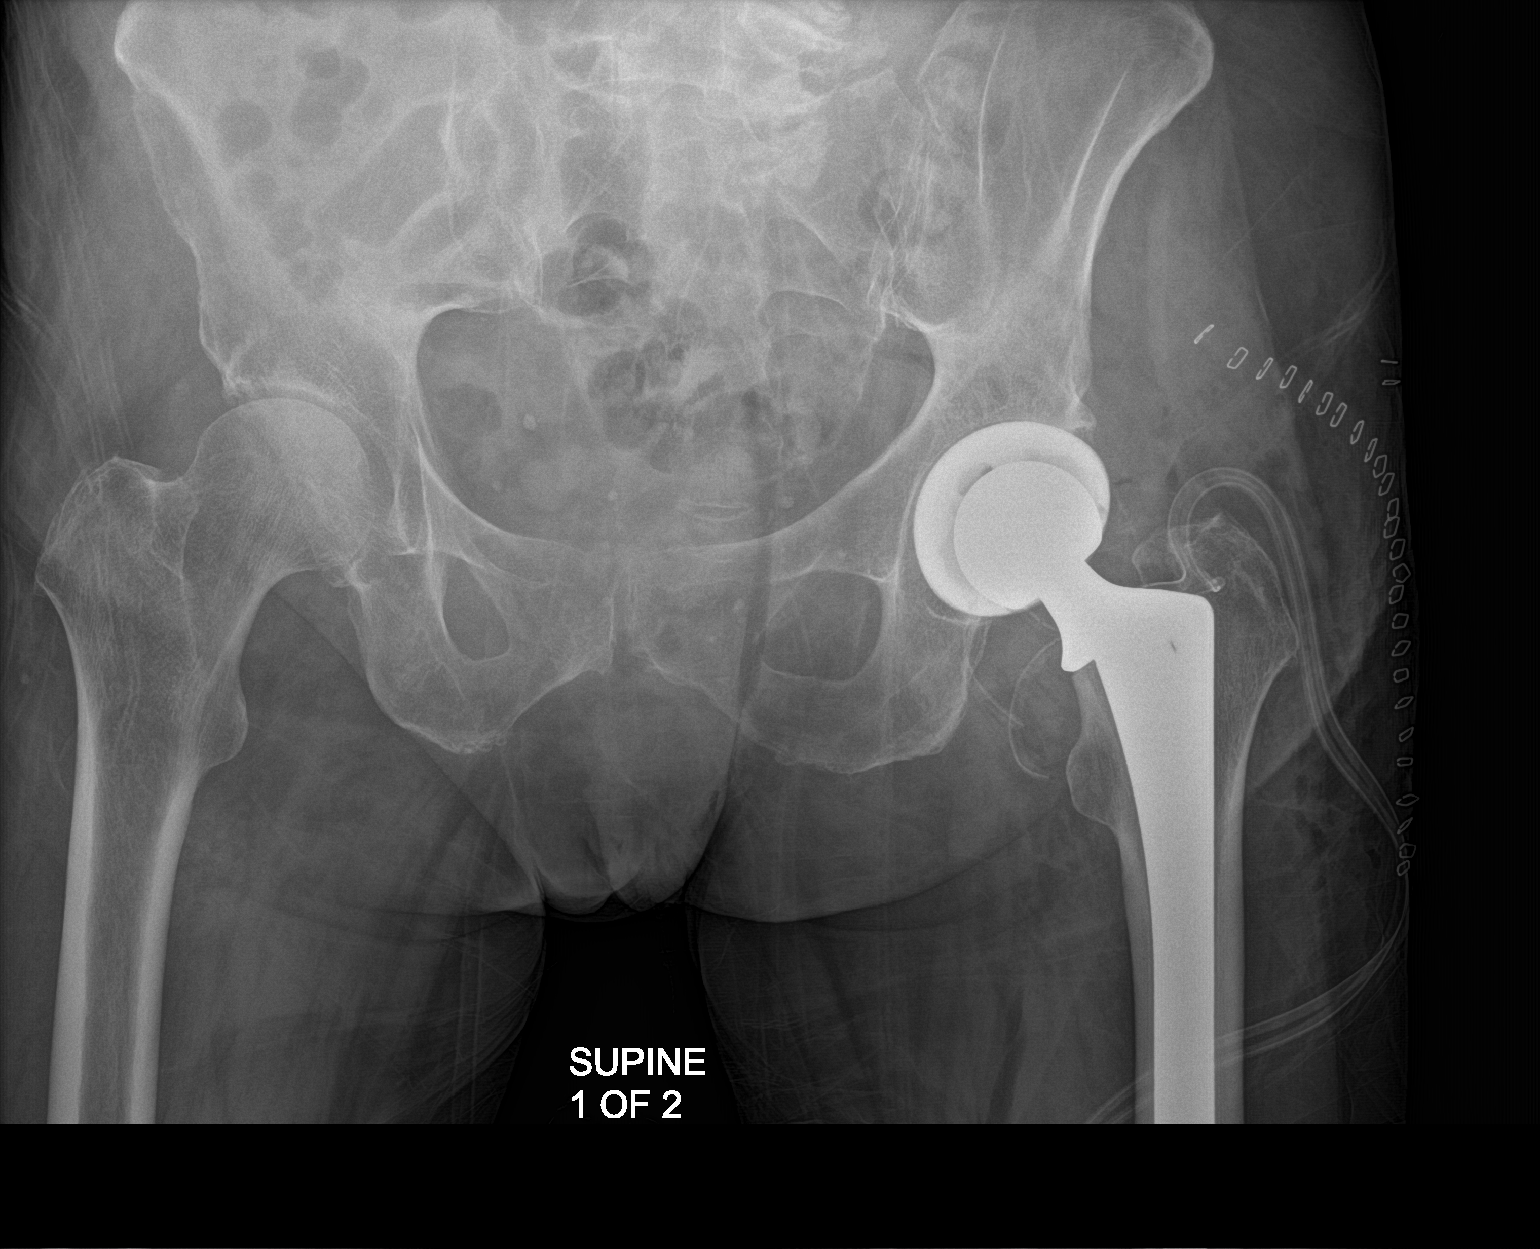
[im 2/2]
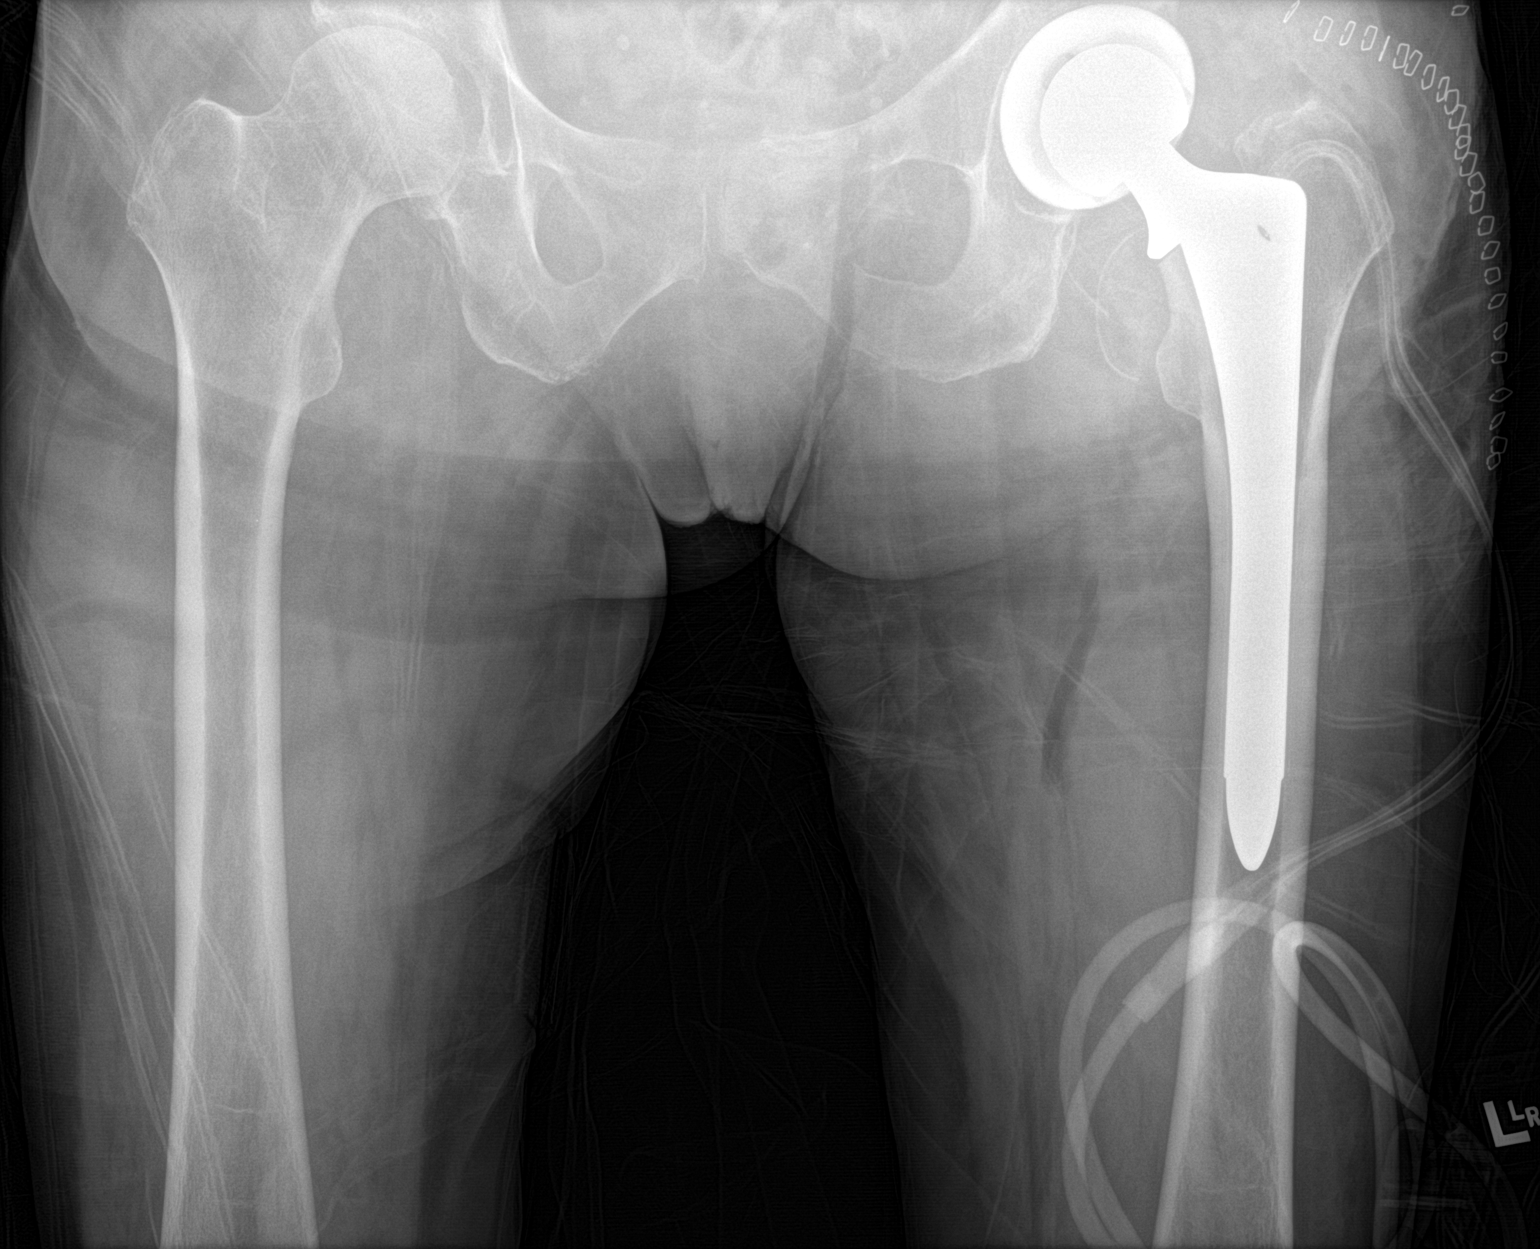

[hip lat]
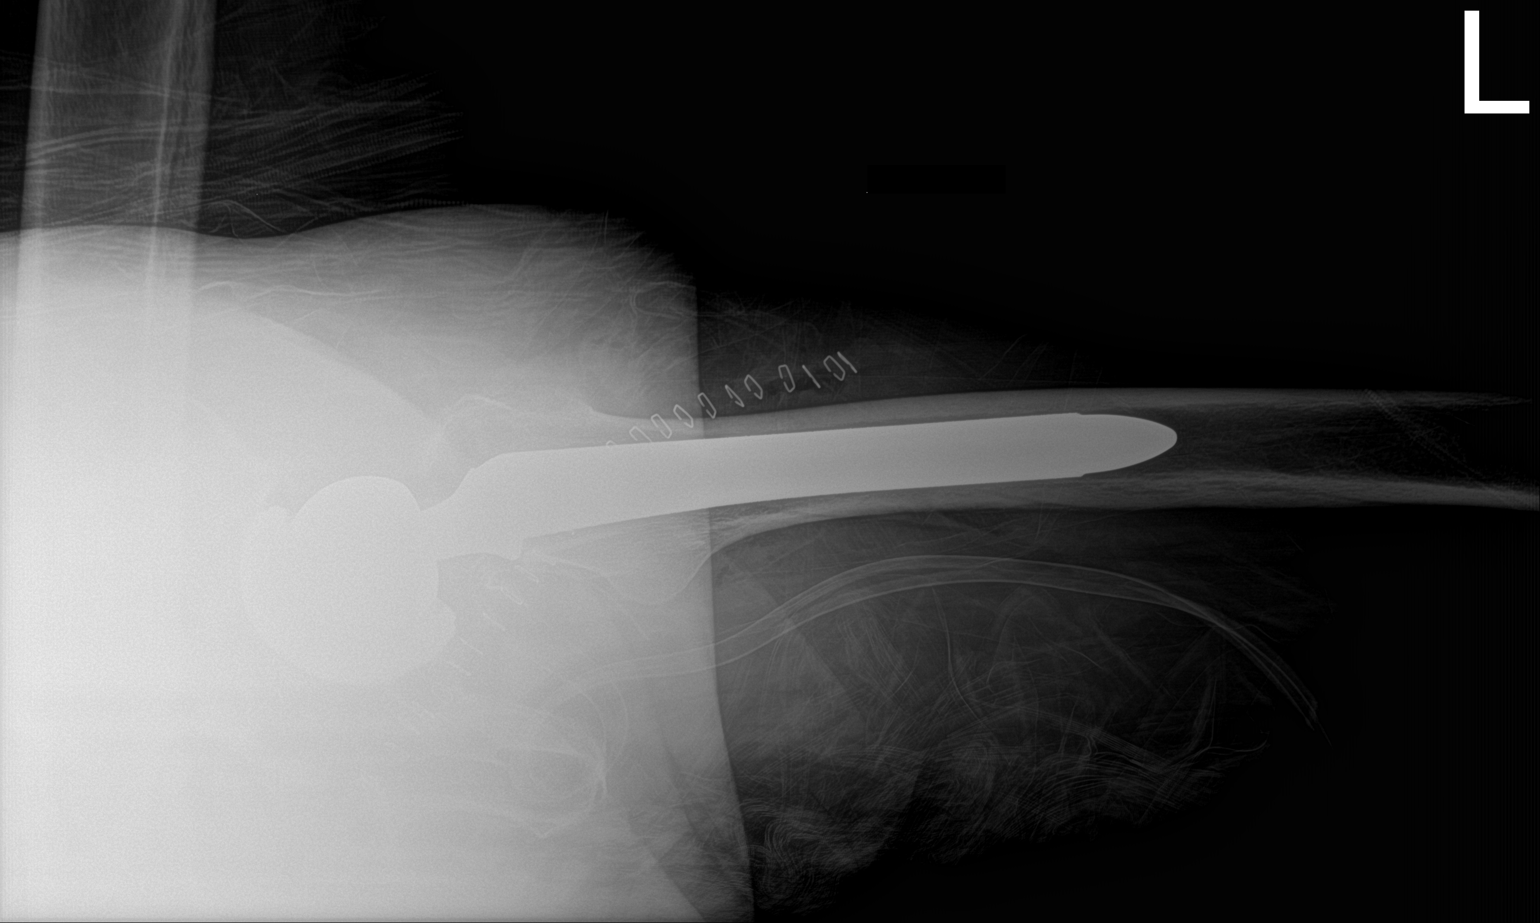

[3 of 3 positions shown; findings below may reference images not displayed]

FINDINGS: Pelvic ring as visualized is intact. Left hip replacement is now
seen. Surgical drains and staples are noted. No acute bony or soft
tissue abnormality is seen.
IMPRESSION: Left hip replacement.

## 2021-04-02 ENCOUNTER — Telehealth: Payer: Self-pay

## 2021-04-02 ENCOUNTER — Ambulatory Visit: Payer: Self-pay

## 2021-04-02 NOTE — Telephone Encounter (Signed)
  Chronic Care Management   Outreach Note  04/02/2021 Name: Rachel James MRN: [id number] DOB: 15-May-1946   Reason for referral : Chronic Care Management   An unsuccessful telephone outreach was attempted today. [name] is currently enrolled in the chronic care management program.    Follow Up Plan:  A HIPAA compliant voice message was left today requesting a return call.   [facility] Care Management [facility] [phone]

## 2021-04-02 NOTE — Chronic Care Management (AMB) (Signed)
Error Please Disregard

## 2021-04-07 ENCOUNTER — Ambulatory Visit: Payer: Medicare HMO | Admitting: Family Medicine

## 2021-04-08 ENCOUNTER — Other Ambulatory Visit: Payer: Self-pay

## 2021-04-08 ENCOUNTER — Ambulatory Visit (INDEPENDENT_AMBULATORY_CARE_PROVIDER_SITE_OTHER): Payer: Medicare HMO | Admitting: Family Medicine

## 2021-04-08 ENCOUNTER — Encounter: Payer: Self-pay | Admitting: Family Medicine

## 2021-04-08 VITALS — BP 110/68 | HR 75 | Temp 98.3°F | Resp 16 | Ht 66.0 in | Wt 134.6 lb

## 2021-04-08 DIAGNOSIS — M5412 Radiculopathy, cervical region: Secondary | ICD-10-CM | POA: Diagnosis not present

## 2021-04-08 MED ORDER — PREDNISONE 10 MG PO TABS: ORAL_TABLET | ORAL | 0 refills | Status: DC

## 2021-04-08 NOTE — Patient Instructions (Signed)

## 2021-04-08 NOTE — Progress Notes (Signed)
4/21/202210:46 AM  Rachel James Nov 21, 1946, 75 y.o., female [id number]  Chief Complaint  Patient presents with  . Neck Pain    stiffness    HPI:   Patient is a 75 y.o. female with past medical history significant for GERD, HTN, OA who presents today for neck pain.  Neck pain started in 2010 Realized when stress has increasing neck pain This comes and goes Rent [employer] got raised by $300 a month Now will be moving to [place] to live with sister Lives alone, but daughter will help her move Usually takes steroids for this Uses heat for pain Tries to limit naproxen due to CKD   Depression screen [facility] 2/9 04/08/2021 02/11/2021 12/30/2020  Decreased Interest 0 0 0  Down, Depressed, Hopeless 0 0 0  PHQ - 2 Score 0 0 0  Altered sleeping 0 - -  Tired, decreased energy 0 - -  Change in appetite 0 - -  Feeling bad or failure about yourself  0 - -  Trouble concentrating 0 - -  Moving slowly or fidgety/restless 0 - -  Suicidal thoughts 0 - -  PHQ-9 Score 0 - -  Difficult doing work/chores Not difficult at all - -  Some recent data might be hidden    Fall Risk  04/08/2021 02/11/2021 12/30/2020 08/05/2020 04/29/2020  Falls in the past year? 0 1 0 0 0  Number falls in past yr: 0 1 0 - 0  Injury with Fall? 0 0 0 - 0  Risk for fall due to : - History of fall(s) - Orthopedic patient;Medication side effect -  Risk for fall due to: Comment - - - - -  Follow up - Falls prevention discussed - Falls prevention discussed -     Allergies  Allergen Reactions  . Codeine Itching and Other (See Comments)    Nightmare   . Tramadol Hcl     Light headed and nauseated  . Oxycodone Itching    Prior to Admission medications   Medication Sig Start Date End Date Taking? Authorizing Provider  acetaminophen (TYLENOL) 500 MG tablet Take 1,000 mg by mouth 2 (two) times daily.    [provider]  docusate sodium (COLACE) 100 MG capsule Take 100 mg by mouth daily as needed for mild  constipation.     [provider]  famotidine (PEPCID) 40 MG tablet TAKE 1 TABLET (40 MG TOTAL) BY MOUTH 2 (TWO) TIMES DAILY AS NEEDED FOR HEARTBURN OR INDIGESTION. 09/07/20   Towanda Malkin, MD  fluticasone (FLONASE) 50 MCG/ACT nasal spray Place 1 spray into both nostrils daily as needed for allergies.     [provider]  hydrochlorothiazide (MICROZIDE) 12.5 MG capsule Take 1 capsule (12.5 mg total) by mouth daily. 03/10/21   Myles Gip, DO  losartan (COZAAR) 100 MG tablet Take 1 tablet (100 mg total) by mouth daily. 03/09/21   Myles Gip, DO  magnesium oxide (MAG-OX) 400 (241.3 Mg) MG tablet Take 400 mg by mouth at bedtime.     [provider]  metoprolol succinate (TOPROL-XL) 100 MG 24 hr tablet Take 1 tablet (100 mg total) by mouth daily. Take with or immediately following a meal. 01/25/21   Delsa Grana, PA-C  Multiple Vitamins-Minerals (MULTIVITAMIN PO) Take 1 tablet by mouth daily.    [provider]  oxymetazoline (AFRIN) 0.05 % nasal spray Place 1 spray into both nostrils daily as needed for congestion.    [provider]  Propylene Glycol-Glycerin (  SOOTHE OP) Place 1 drop into both eyes 3 (three) times daily as needed (dry eyes).    [provider]  simvastatin (ZOCOR) 10 MG tablet Take 1 tablet (10 mg total) by mouth daily. 03/09/21   Myles Gip, DO  tiZANidine (ZANAFLEX) 4 MG tablet Take 1 tablet (4 mg total) by mouth every 8 (eight) hours as needed for muscle spasms. 01/06/21   Towanda Malkin, MD    Past Medical History:  Diagnosis Date  . Allergy   . Arthritis    knees, lower back  . Cervical radiculitis   . Chronic radicular low back pain   . CKD (chronic kidney disease) stage 2, GFR 60-89 ml/min 07/11/2017  . Degenerative disc disease, lumbar   . GERD (gastroesophageal reflux disease)   . Hyperlipidemia LDL goal <100   . Hypertension   . Osteopenia 06/22/2017   June 2016 DEXA  . Pneumonia  2012  . PONV (postoperative nausea and vomiting)    in past.  none recently    Past Surgical History:  Procedure Laterality Date  .  THUMB SURGERY Left 2008   joint replacement  . APPENDECTOMY    . BREAST BIOPSY Right 1992   neg  . BREAST LUMPECTOMY Right 1992  . BUNIONECTOMY Bilateral 1978, 2002  . CERVICAL DISCECTOMY  09/18/2012   C4-C5 AND C6-C7 ACDF   . COLONOSCOPY    . JOINT REPLACEMENT  2008   thumb  . JOINT REPLACEMENT Right 2019   shoulder  . KNEE ARTHROPLASTY Right 11/04/2019   Procedure: COMPUTER ASSISTED TOTAL KNEE ARTHROPLASTY;  Surgeon: Dereck Leep, MD;  Location: [facility];  Service: Orthopedics;  Laterality: Right;  . REVERSE SHOULDER ARTHROPLASTY Left 11/17/2020   Procedure: REVERSE SHOULDER ARTHROPLASTY;  Surgeon: Corky Mull, MD;  Location: [facility];  Service: Orthopedics;  Laterality: Left;  . SHOULDER ARTHROSCOPY WITH ROTATOR CUFF REPAIR AND OPEN BICEPS TENODESIS Right 12/20/2017   Procedure: SHOULDER ARTHROSCOPY WITH DEBRIDEMENT, DECOMPRESSION AND  BICEPS TENODESIS;  Surgeon: Corky Mull, MD;  Location: [place];  Service: Orthopedics;  Laterality: Right;  . SPINE SURGERY  2013   cervical  . TOTAL HIP ARTHROPLASTY Left 06/10/2020   Procedure: TOTAL HIP ARTHROPLASTY;  Surgeon: Dereck Leep, MD;  Location: [facility];  Service: Orthopedics;  Laterality: Left;  . TOTAL SHOULDER ARTHROPLASTY Right 08/09/2018   Procedure: RIGHT TOTAL SHOULDER ARTHROPLASTY;  Surgeon: Justice Britain, MD;  Location: [place];  Service: Orthopedics;  Laterality: Right;  . TUBAL LIGATION Bilateral     Social History   Tobacco Use  . Smoking status: Never Smoker  . Smokeless tobacco: Never Used  . Tobacco comment: smoking cessation materials not required  Substance Use Topics  . Alcohol use: Not Currently    Alcohol/week: 0.0 standard drinks    Comment: alcohol raises BP    Family History  Problem Relation Age of Onset  . Heart disease Mother   . Heart attack  Mother   . Arthritis Mother   . Heart disease Father   . Heart disease Sister   . Arthritis Sister   . COPD Sister   . Heart disease Brother   . Heart failure Brother   . Memory loss Brother   . Arthritis Brother   . Arthritis Maternal Grandmother   . Diabetes Paternal Grandmother   . Pulmonary embolism Sister   . Breast cancer Neg Hx     Review of Systems  Eyes: Negative.   Respiratory: Negative.  Cardiovascular: Negative.   Musculoskeletal: Positive for neck pain.  Neurological: Negative for dizziness, sensory change, focal weakness and headaches.     OBJECTIVE:  Today's Vitals   04/08/21 1030  BP: 110/68  Pulse: 75  Resp: 16  Temp: 98.3 F (36.8 C)  SpO2: 99%  Weight: 134 lb 9.6 oz (61.1 kg)  Height: 5\' 6"  (1.676 m)   Body mass index is 21.73 kg/m.   Physical Exam Constitutional:      General: She is not in acute distress.    Appearance: Normal appearance. She is not ill-appearing.  HENT:     Head: Normocephalic.  Cardiovascular:     Rate and Rhythm: Normal rate and regular rhythm.     Pulses: Normal pulses.     Heart sounds: Normal heart sounds. No murmur heard. No friction rub. No gallop.   Pulmonary:     Effort: Pulmonary effort is normal. No respiratory distress.     Breath sounds: Normal breath sounds. No stridor. No wheezing, rhonchi or rales.  Abdominal:     General: Bowel sounds are normal.     Palpations: Abdomen is soft.     Tenderness: There is no abdominal tenderness.  Musculoskeletal:     Cervical back: No swelling, deformity, signs of trauma, rigidity, spasms, torticollis, tenderness, bony tenderness or crepitus. Pain with movement present. Normal range of motion.     Right lower leg: No edema.     Left lower leg: No edema.  Skin:    General: Skin is warm and dry.  Neurological:     Mental Status: She is alert and oriented to person, place, and time.  Psychiatric:        Mood and Affect: Mood normal.        Behavior: Behavior  normal.     No results found for this or any previous visit (from the past 24 hour(s)).  No results found.   ASSESSMENT and PLAN  Problem List Items Addressed This Visit      Nervous and Auditory   Cervical radiculitis - Primary   Relevant Medications   predniSONE (DELTASONE) 10 MG tablet      Plan . R/se/b of medications ordered discussed . RTC/ED precautions provided . Continue conservative treatments and OTC medications   Return if symptoms worsen or fail to improve.    Huston Foley Mako Pelfrey, FNP-BC [facility]

## 2021-04-26 ENCOUNTER — Other Ambulatory Visit: Payer: Self-pay | Admitting: Family Medicine

## 2021-04-26 DIAGNOSIS — I10 Essential (primary) hypertension: Secondary | ICD-10-CM

## 2021-04-26 MED ORDER — METOPROLOL SUCCINATE ER 100 MG PO TB24: 100.0000 mg | ORAL_TABLET | Freq: Every day | ORAL | 0 refills | Status: DC

## 2021-04-26 NOTE — Telephone Encounter (Signed)
Requested Prescriptions  Pending Prescriptions Disp Refills  . metoprolol succinate (TOPROL-XL) 100 MG 24 hr tablet 90 tablet 0    Sig: Take 1 tablet (100 mg total) by mouth daily. Take with or immediately following a meal.     Cardiovascular:  Beta Blockers Passed - 04/26/2021  1:18 PM      Passed - Last BP in normal range    BP Readings from Last 1 Encounters:  04/08/21 110/68         Passed - Last Heart Rate in normal range    Pulse Readings from Last 1 Encounters:  04/08/21 75         Passed - Valid encounter within last 6 months    Recent Outpatient Visits          2 weeks ago Cervical radiculitis   [facility] Just, Laurita Quint, FNP   3 months ago Hypertension goal BP (blood pressure) < 140/90   [facility] Lebron Conners D, MD   12 months ago Viral upper respiratory tract infection   [facility] Lebron Conners D, MD   1 year ago Hypertension goal BP (blood pressure) < 140/90   [facility] Le[facility] Conners D, MD   1 year ago Hypertension goal BP (blood pressure) < 140/90   [facility] Towanda Malkin, MD

## 2021-04-26 NOTE — Telephone Encounter (Signed)
Medication Refill - Medication: metoprolol succinate (TOPROL-XL) 100 MG 24 hr tablet Pt tried mychart request / but it stated there was no recipient   Has the patient contacted their pharmacy? No. (Agent: If no, request that the patient contact the pharmacy for the refill.) (Agent: If yes, when and what did the pharmacy advise?)  Preferred Pharmacy (with phone number or street name):  [id number] - [place], [address] Phone:  [phone]  Fax:  [phone]       Agent: Please be advised that RX refills may take up to 3 business days. We ask that you follow-up with your pharmacy.

## 2021-04-27 DIAGNOSIS — D225 Melanocytic nevi of trunk: Secondary | ICD-10-CM | POA: Diagnosis not present

## 2021-04-27 DIAGNOSIS — L820 Inflamed seborrheic keratosis: Secondary | ICD-10-CM | POA: Diagnosis not present

## 2021-04-27 DIAGNOSIS — D2262 Melanocytic nevi of left upper limb, including shoulder: Secondary | ICD-10-CM | POA: Diagnosis not present

## 2021-04-27 DIAGNOSIS — D2271 Melanocytic nevi of right lower limb, including hip: Secondary | ICD-10-CM | POA: Diagnosis not present

## 2021-04-27 DIAGNOSIS — L821 Other seborrheic keratosis: Secondary | ICD-10-CM | POA: Diagnosis not present

## 2021-04-27 DIAGNOSIS — D2261 Melanocytic nevi of right upper limb, including shoulder: Secondary | ICD-10-CM | POA: Diagnosis not present

## 2021-04-27 DIAGNOSIS — X32XXXA Exposure to sunlight, initial encounter: Secondary | ICD-10-CM | POA: Diagnosis not present

## 2021-04-27 DIAGNOSIS — R208 Other disturbances of skin sensation: Secondary | ICD-10-CM | POA: Diagnosis not present

## 2021-04-27 DIAGNOSIS — L570 Actinic keratosis: Secondary | ICD-10-CM | POA: Diagnosis not present

## 2021-04-27 DIAGNOSIS — D2272 Melanocytic nevi of left lower limb, including hip: Secondary | ICD-10-CM | POA: Diagnosis not present

## 2021-05-05 DIAGNOSIS — M25361 Other instability, right knee: Secondary | ICD-10-CM | POA: Diagnosis not present

## 2021-05-07 DIAGNOSIS — M19012 Primary osteoarthritis, left shoulder: Secondary | ICD-10-CM | POA: Diagnosis not present

## 2021-05-07 DIAGNOSIS — Z96612 Presence of left artificial shoulder joint: Secondary | ICD-10-CM | POA: Diagnosis not present

## 2021-05-10 ENCOUNTER — Ambulatory Visit (INDEPENDENT_AMBULATORY_CARE_PROVIDER_SITE_OTHER): Payer: Medicare HMO

## 2021-05-10 DIAGNOSIS — I10 Essential (primary) hypertension: Secondary | ICD-10-CM

## 2021-05-10 DIAGNOSIS — E785 Hyperlipidemia, unspecified: Secondary | ICD-10-CM | POA: Diagnosis not present

## 2021-05-10 NOTE — Chronic Care Management (AMB) (Signed)
Chronic Care Management   Follow Up Note   05/10/2021 Name: Rachel James MRN: [id number] DOB: 1946-08-04  Primary Care Provider: Delsa Grana, PA-C Reason for referral : Chronic Care Management   Rachel James is a 75 y.o. year old female who is a primary care patient of Delsa Grana, [place]. She is currently enrolled in the Chronic Care Management program. A routine telephonic outreach was conducted today.  Review of Rachel James status, including review of consultants reports, relevant labs and test results was conducted today. Collaboration with appropriate care team members was performed as part of the comprehensive evaluation and provision of chronic care management services.    SDOH (Social Determinants of Health) assessments performed: No    Outpatient Encounter Medications as of 05/10/2021  Medication Sig Note  . acetaminophen (TYLENOL) 500 MG tablet Take 1,000 mg by mouth 2 (two) times daily.   Marland Kitchen docusate sodium (COLACE) 100 MG capsule Take 100 mg by mouth daily as needed for mild constipation.    . famotidine (PEPCID) 40 MG tablet TAKE 1 TABLET (40 MG TOTAL) BY MOUTH 2 (TWO) TIMES DAILY AS NEEDED FOR HEARTBURN OR INDIGESTION. 02/11/2021: PRN only   . fluticasone (FLONASE) 50 MCG/ACT nasal spray Place 1 spray into both nostrils daily as needed for allergies.    . hydrochlorothiazide (MICROZIDE) 12.5 MG capsule Take 1 capsule (12.5 mg total) by mouth daily.   Marland Kitchen losartan (COZAAR) 100 MG tablet Take 1 tablet (100 mg total) by mouth daily.   . magnesium oxide (MAG-OX) 400 (241.3 Mg) MG tablet Take 400 mg by mouth at bedtime.    . metoprolol succinate (TOPROL-XL) 100 MG 24 hr tablet Take 1 tablet (100 mg total) by mouth daily. Take with or immediately following a meal.   . Multiple Vitamins-Minerals (MULTIVITAMIN PO) Take 1 tablet by mouth daily.   Marland Kitchen oxymetazoline (AFRIN) 0.05 % nasal spray Place 1 spray into both nostrils daily as needed for congestion.   . predniSONE (DELTASONE)  10 MG tablet Take six pills by mouth on day 1, then 5 on day 2, 4 on day 3, 3 on day 4, 2 on day 5, and 1 on day 6   . Propylene Glycol-Glycerin (SOOTHE OP) Place 1 drop into both eyes 3 (three) times daily as needed (dry eyes). 02/11/2021: PRN only   . simvastatin (ZOCOR) 10 MG tablet Take 1 tablet (10 mg total) by mouth daily.   Marland Kitchen tiZANidine (ZANAFLEX) 4 MG tablet Take 1 tablet (4 mg total) by mouth every 8 (eight) hours as needed for muscle spasms.    No facility-administered encounter medications on file as of 05/10/2021.     Objective:  Patient Care Plan: Hypertension (Adult)    Problem Identified: Hypertension (Hypertension)     Long-Range Goal: Hypertension Monitored   Start Date: 05/10/2021  Expected End Date: 09/07/2021  Priority: High  Note:   Objective:  . Last practice recorded BP readings:  BP Readings from Last 3 Encounters:  04/08/21 110/68  12/30/20 (!) 152/80  11/18/20 119/71 .   Marland Kitchen Most recent eGFR/CrCl: No results found for: EGFR  No components found for: CRCL  Lab Results  Component Value Date   CHOL 204 (H) 04/07/2020   HDL 87 04/07/2020   LDLCALC 101 (H) 04/07/2020   TRIG 74 04/07/2020   CHOLHDL 2.3 04/07/2020     Current Barriers:  . Chronic Disease Management support and educational needs r/t Hypertension and Dyslipidemia.  Case Manager Clinical Goal(s):  Marland Kitchen Over  the next 120 days, patient will demonstrate improved adherence to prescribed treatment plan as evidenced by taking all medications as prescribed, monitoring and recording blood pressure, and adhering to a cardiac prudent/heart healthy diet.   Interventions:  . Collaboration with Delsa Grana, PA-C regarding development and update of comprehensive plan of care as evidenced by provider attestation and co-signature . Inter-disciplinary care team collaboration (see longitudinal plan of care) . Reviewed medications and compliance with current treatment plan. Reports excellent compliance. Tolerating  statin well. Denies concerns regarding medication management or prescription costs. . Reviewed established blood pressure parameters along with indications for notifying a provider. Reports BP readings have been within range. Denies episodes of chest pain, palpitations, headaches, or dizziness. . Reviewed compliance with recommended cardiac diet. Discussed activity level. Reports doing very well with nutritional intake. Currently engages in water aerobics and swims 3 times a week. Denies recent change or decline in activity tolerance.   . Reviewed s/sx of heart attack, stroke and worsening symptoms that require immediate medical attention.    Patient Goals/Self-Care Activities: -Self-administer medications as prescribed -Attend all scheduled provider appointments -Monitor and record blood pressure -Adhere to recommended cardiac prudent/heart healthy diet -Notify provider or care management team with questions and new concerns as needed   Follow Up Plan:  Will follow up next month       PLAN A member of the care management team will follow up with Rachel James next month.    Cristy Friedlander Health/[facility]N Care Management [facility] [phone]

## 2021-05-13 NOTE — Patient Instructions (Signed)
  Thank you for allowing the Chronic Care Management team to participate in your care. It was a pleasure speaking with you. Please feel free to contact me with questions.  Goals Addressed: Patient Care Plan: Hypertension (Adult)    Problem Identified: Hypertension (Hypertension)     Long-Range Goal: Hypertension Monitored   Start Date: 05/10/2021  Expected End Date: 09/07/2021  Priority: High  Note:   Objective:  . Last practice recorded BP readings:  BP Readings from Last 3 Encounters:  04/08/21 110/68  12/30/20 (!) 152/80  11/18/20 119/71 .   Marland Kitchen Most recent eGFR/CrCl: No results found for: EGFR  No components found for: CRCL  Lab Results  Component Value Date   CHOL 204 (H) 04/07/2020   HDL 87 04/07/2020   LDLCALC 101 (H) 04/07/2020   TRIG 74 04/07/2020   CHOLHDL 2.3 04/07/2020     Current Barriers:  . Chronic Disease Management support and educational needs r/t Hypertension and Dyslipidemia.  Case Manager Clinical Goal(s):  Marland Kitchen Over the next 120 days, patient will demonstrate improved adherence to prescribed treatment plan as evidenced by taking all medications as prescribed, monitoring and recording blood pressure, and adhering to a cardiac prudent/heart healthy diet.   Interventions:  . Collaboration with Rachel Grana, PA-C regarding development and update of comprehensive plan of care as evidenced by provider attestation and co-signature . Inter-disciplinary care team collaboration (see longitudinal plan of care) . Reviewed medications and compliance with current treatment plan. Reports excellent compliance. Tolerating statin well. Denies concerns regarding medication management or prescription costs. . Reviewed established blood pressure parameters along with indications for notifying a provider. Reports BP readings have been within range. Denies episodes of chest pain, palpitations, headaches, or dizziness. . Reviewed compliance with recommended cardiac diet. Discussed  activity level. Reports doing very well with nutritional intake. Currently engages in water aerobics and swims 3 times a week. Denies recent change or decline in activity tolerance.   . Reviewed s/sx of heart attack, stroke and worsening symptoms that require immediate medical attention.    Patient Goals/Self-Care Activities: -Self-administer medications as prescribed -Attend all scheduled provider appointments -Monitor and record blood pressure -Adhere to recommended cardiac prudent/heart healthy diet -Notify provider or care management team with questions and new concerns as needed   Follow Up Plan:  Will follow up next month       Rachel James verbalized understanding of the information discussed during the telephonic outreach today. Declined need for mailed/printed instructions. A member of the care management team will follow up with Rachel James next month.    Rachel James Health/[facility] Care Management [facility] [phone]

## 2021-05-20 ENCOUNTER — Telehealth: Payer: Self-pay

## 2021-05-20 NOTE — Telephone Encounter (Signed)
Copied from [place] [phone]. Topic: General - Other >> May 20, 2021  9:36 AM Alanda Slim E wrote: Reason for CRM: Pt has changed pharmacies / FYI all med to go to [facility]

## 2021-06-02 ENCOUNTER — Ambulatory Visit (INDEPENDENT_AMBULATORY_CARE_PROVIDER_SITE_OTHER): Payer: Medicare Other

## 2021-06-02 ENCOUNTER — Other Ambulatory Visit: Payer: Self-pay | Admitting: Family Medicine

## 2021-06-02 DIAGNOSIS — I10 Essential (primary) hypertension: Secondary | ICD-10-CM

## 2021-06-02 DIAGNOSIS — K219 Gastro-esophageal reflux disease without esophagitis: Secondary | ICD-10-CM

## 2021-06-02 DIAGNOSIS — E785 Hyperlipidemia, unspecified: Secondary | ICD-10-CM

## 2021-06-02 DIAGNOSIS — M5412 Radiculopathy, cervical region: Secondary | ICD-10-CM

## 2021-06-02 MED ORDER — FAMOTIDINE 40 MG PO TABS: 40.0000 mg | ORAL_TABLET | Freq: Two times a day (BID) | ORAL | 1 refills | Status: AC | PRN

## 2021-06-02 MED ORDER — HYDROCHLOROTHIAZIDE 12.5 MG PO CAPS: 12.5000 mg | ORAL_CAPSULE | Freq: Every day | ORAL | 0 refills | Status: AC

## 2021-06-02 NOTE — Telephone Encounter (Signed)
Copied from [place] [id number]. Topic: Quick Communication - Rx Refill/Question >> Jun 02, 2021 12:39 PM Mcneil, Ja-Kwan wrote: Medication: famotidine (PEPCID) 40 MG tablet, tiZANidine (ZANAFLEX) 4 MG tablet, hydrochlorothiazide (MICROZIDE) 12.5 MG capsule, losartan (COZAAR) 100 MG tablet, and simvastatin (ZOCOR) 10 MG tablet  Has the patient contacted their pharmacy? Yes.   (Agent: If no, request that the patient contact the pharmacy for the refill.) (Agent: If yes, when and what did the pharmacy advise?)  Preferred Pharmacy (with phone number or street name): [facility] ([place], [place] [place], [address]  Phone: [phone]   Fax: [phone]  Agent: Please be advised that RX refills may take up to 3 business days. We ask that you follow-up with your pharmacy.

## 2021-06-02 NOTE — Telephone Encounter (Signed)
Notes to clinic:  review medication for refill Last seen by [facility] Just on 04/08/2021 Patient due for labs    Requested Prescriptions  Pending Prescriptions Disp Refills   famotidine (PEPCID) 40 MG tablet 180 tablet 1    Sig: Take 1 tablet (40 mg total) by mouth 2 (two) times daily as needed for heartburn or indigestion.      Gastroenterology:  H2 Antagonists Passed - 06/02/2021 12:44 PM      Passed - Valid encounter within last 12 months    Recent Outpatient Visits           1 month ago Cervical radiculitis   [facility], Laurita Quint, FNP   5 months ago Hypertension goal BP (blood pressure) < 140/90   [facility] Lebron Conners D, MD   1 year ago Viral upper respiratory tract infection   [place], MD   1 year ago Hypertension goal BP (blood pressure) < 140/90   [facility] Lebron Conners D, MD   1 year ago Hypertension goal BP (blood pressure) < 140/90   [facility] [place], Derald Macleod D, MD                  tiZANidine (ZANAFLEX) 4 MG tablet 90 tablet 0    Sig: Take 1 tablet (4 mg total) by mouth every 8 (eight) hours as needed for muscle spasms.      Not Delegated - Cardiovascular:  Alpha-2 Agonists - tizanidine Failed - 06/02/2021 12:44 PM      Failed - This refill cannot be delegated      Passed - Valid encounter within last 6 months    Recent Outpatient Visits           1 month ago Cervical radiculitis   [facility], Laurita Quint, FNP   5 months ago Hypertension goal BP (blood pressure) < 140/90   [facility] Lebron Conners D, MD   1 year ago Viral upper respiratory tract infection   [place], [place]   1 year ago Hypertension goal BP (blood pressure) < 140/90   [facility] Lebron Conners D, MD   1  year ago Hypertension goal BP (blood pressure) < 140/90   [facility] [place], Derald Macleod D, MD                  hydrochlorothiazide (MICROZIDE) 12.5 MG capsule 90 capsule 0    Sig: Take 1 capsule (12.5 mg total) by mouth daily.      Cardiovascular: Diuretics - Thiazide Failed - 06/02/2021 12:44 PM      Failed - Ca in normal range and within 360 days    Calcium  Date Value Ref Range Status  11/18/2020 8.7 (L) 8.9 - 10.3 mg/dL Final          Failed - Na in normal range and within 360 days    Sodium  Date Value Ref Range Status  11/18/2020 134 (L) 135 - 145 mmol/L Final  08/23/2016 140 134 - 144 mmol/L Final          Passed - Cr in normal range and within 360 days    Creat  Date Value Ref Range Status  04/07/2020 0.84 0.60 - 0.93 mg/dL Final    Comment:    For patients >13 years of age, the reference limit for  Creatinine is approximately 13% higher for people identified as African-American. .    Creatinine, Ser  Date Value Ref Range Status  11/18/2020 0.83 0.44 - 1.00 mg/dL Final          Passed - K in normal range and within 360 days    Potassium  Date Value Ref Range Status  11/18/2020 3.8 3.5 - 5.1 mmol/L Final          Passed - Last BP in normal range    BP Readings from Last 1 Encounters:  04/08/21 110/68          Passed - Valid encounter within last 6 months    Recent Outpatient Visits           1 month ago Cervical radiculitis   [facility], Laurita Quint, FNP   5 months ago Hypertension goal BP (blood pressure) < 140/90   [facility] Lebron Conners D, MD   1 year ago Viral upper respiratory tract infection   [place], MD   1 year ago Hypertension goal BP (blood pressure) < 140/90   [facility] Lebron Conners D, MD   1 year ago Hypertension goal BP (blood pressure) < 140/90   [facility] [place], Derald Macleod D, MD                  losartan (COZAAR) 100 MG tablet 90 tablet 3    Sig: Take 1 tablet (100 mg total) by mouth daily.      Cardiovascular:  Angiotensin Receptor Blockers Failed - 06/02/2021 12:44 PM      Failed - Cr in normal range and within 180 days    Creat  Date Value Ref Range Status  04/07/2020 0.84 0.60 - 0.93 mg/dL Final    Comment:    For patients >5 years of age, the reference limit for Creatinine is approximately 13% higher for people identified as African-American. .    Creatinine, Ser  Date Value Ref Range Status  11/18/2020 0.83 0.44 - 1.00 mg/dL Final          Failed - K in normal range and within 180 days    Potassium  Date Value Ref Range Status  11/18/2020 3.8 3.5 - 5.1 mmol/L Final          Passed - Patient is not pregnant      Passed - Last BP in normal range    BP Readings from Last 1 Encounters:  04/08/21 110/68          Passed - Valid encounter within last 6 months    Recent Outpatient Visits           1 month ago Cervical radiculitis   [facility], Laurita Quint, FNP   5 months ago Hypertension goal BP (blood pressure) < 140/90   [facility] Lebron Conners D, MD   1 year ago Viral upper respiratory tract infection   [place], MD   1 year ago Hypertension goal BP (blood pressure) < 140/90   [facility] Lebron Conners D, MD   1 year ago Hypertension goal BP (blood pressure) < 140/90   [facility] [place], Derald Macleod D, MD                  simvastatin (ZOCOR) 10 MG tablet 90 tablet 3  Sig: Take 1 tablet (10 mg total) by mouth daily.      Cardiovascular:  Antilipid - Statins Failed - 06/02/2021 12:44 PM      Failed - Total Cholesterol in normal range and within 360 days    Cholesterol, Total  Date Value Ref Range Status  08/23/2016 158 100  - 199 mg/dL Final   Cholesterol  Date Value Ref Range Status  04/07/2020 204 (H) <200 mg/dL Final          Failed - LDL in normal range and within 360 days    LDL Cholesterol (Calc)  Date Value Ref Range Status  04/07/2020 101 (H) mg/dL (calc) Final    Comment:    Reference range: <100 . Desirable range <100 mg/dL for primary prevention;   <70 mg/dL for patients with CHD or diabetic patients  with > or = 2 CHD risk factors. Marland Kitchen LDL-C is now calculated using the Martin-Hopkins  calculation, which is a validated novel method providing  better accuracy than the Friedewald equation in the  estimation of LDL-C.  Cresenciano Genre et al. Annamaria Helling. 8938;101(75): 2061-2068  (http://education.[employer].com/faq/FAQ164)           Failed - HDL in normal range and within 360 days    HDL  Date Value Ref Range Status  04/07/2020 87 > OR = 50 mg/dL Final  08/23/2016 68 >39 mg/dL Final          Failed - Triglycerides in normal range and within 360 days    Triglycerides  Date Value Ref Range Status  04/07/2020 74 <150 mg/dL Final          Passed - Patient is not pregnant      Passed - Valid encounter within last 12 months    Recent Outpatient Visits           1 month ago Cervical radiculitis   [facility], Laurita Quint, FNP   5 months ago Hypertension goal BP (blood pressure) < 140/90   [facility] Lebron Conners D, MD   1 year ago Viral upper respiratory tract infection   [place], MD   1 year ago Hypertension goal BP (blood pressure) < 140/90   [facility] Lebron Conners D, MD   1 year ago Hypertension goal BP (blood pressure) < 140/90   [facility] Towanda Malkin, MD

## 2021-06-02 NOTE — Chronic Care Management (AMB) (Signed)
Chronic Care Management   Follow Up Note   06/02/2021 Name: Rachel James MRN: [id number] DOB: Oct 31, 1946  Primary Care Provider: Delsa Grana, PA-C Reason for referral : Chronic Care Management   Rachel James is a 75 y.o. year old female who is a primary care patient of Delsa Grana, [place]. A routine telephonic outreach was conducted today. She has met her care management goals.  Review of Rachel James status, including review of consultants reports, relevant labs and test results was conducted today. Collaboration with appropriate care team members was performed as part of the comprehensive evaluation and provision of chronic care management services.     SDOH (Social Determinants of Health) assessments performed: No    Outpatient Encounter Medications as of 06/02/2021  Medication Sig Note   naproxen sodium (ALEVE) 220 MG tablet Take 220 mg by mouth 2 (two) times daily as needed.    acetaminophen (TYLENOL) 500 MG tablet Take 1,000 mg by mouth 2 (two) times daily.    docusate sodium (COLACE) 100 MG capsule Take 100 mg by mouth daily as needed for mild constipation.     famotidine (PEPCID) 40 MG tablet TAKE 1 TABLET (40 MG TOTAL) BY MOUTH 2 (TWO) TIMES DAILY AS NEEDED FOR HEARTBURN OR INDIGESTION. 02/11/2021: PRN only    fluticasone (FLONASE) 50 MCG/ACT nasal spray Place 1 spray into both nostrils daily as needed for allergies.     hydrochlorothiazide (MICROZIDE) 12.5 MG capsule Take 1 capsule (12.5 mg total) by mouth daily.    losartan (COZAAR) 100 MG tablet Take 1 tablet (100 mg total) by mouth daily.    magnesium oxide (MAG-OX) 400 (241.3 Mg) MG tablet Take 400 mg by mouth at bedtime.     metoprolol succinate (TOPROL-XL) 100 MG 24 hr tablet Take 1 tablet (100 mg total) by mouth daily. Take with or immediately following a meal.    Multiple Vitamins-Minerals (MULTIVITAMIN PO) Take 1 tablet by mouth daily.    oxymetazoline (AFRIN) 0.05 % nasal spray Place 1 spray into both nostrils  daily as needed for congestion.    predniSONE (DELTASONE) 10 MG tablet Take six pills by mouth on day 1, then 5 on day 2, 4 on day 3, 3 on day 4, 2 on day 5, and 1 on day 6    Propylene Glycol-Glycerin (SOOTHE OP) Place 1 drop into both eyes 3 (three) times daily as needed (dry eyes). 02/11/2021: PRN only    simvastatin (ZOCOR) 10 MG tablet Take 1 tablet (10 mg total) by mouth daily.    tiZANidine (ZANAFLEX) 4 MG tablet Take 1 tablet (4 mg total) by mouth every 8 (eight) hours as needed for muscle spasms.    No facility-administered encounter medications on file as of 06/02/2021.     Objective:  Patient Care Plan: Hypertension (Adult)     Problem Identified: Hypertension (Hypertension)      Long-Range Goal: Hypertension Monitored Completed 06/02/2021  Start Date: 05/10/2021  Expected End Date: 09/07/2021  Priority: High  Note:    Current Barriers:  Chronic Disease Management support and educational needs r/t Hypertension and Dyslipidemia.  Case Manager Clinical Goal(s):  Over the next 120 days, patient will demonstrate improved adherence to prescribed treatment plan as evidenced by taking all medications as prescribed, monitoring and recording blood pressure, and adhering to a cardiac prudent/heart healthy diet.   Interventions:  Collaboration with Delsa Grana, PA-C regarding development and update of comprehensive plan of care as evidenced by provider attestation and co-signature Inter-disciplinary care  team collaboration (see longitudinal plan of care) Reviewed medications and compliance with current treatment plan. Reports excellent compliance with medications. Tolerating prescribed regimen well. No concerns regarding medication management or prescription cost. She is in the process of relocating. Agreed to keep the clinic and care management team updated regarding needed refills.  Reviewed established blood pressure parameters along with indications for notifying a provider. Reports  BP readings have been within range. Denies episodes of chest pain, palpitations, headaches, or dizziness. Reviewed compliance with activity and recommended cardiac diet. Remains compliant with nutritional intake and frequent activity. Continues to engage in activities at the local [employer] three times a week. She remains very motivated to improve her overall health.  Reviewed s/sx of heart attack, stroke and worsening symptoms that require immediate medical attention.    Patient Goals/Self-Care Activities: -Self-administer medications as prescribed -Attend all scheduled provider appointments -Monitor and record blood pressure -Adhere to recommended cardiac prudent/heart healthy diet -Continue to engage in low impact activity as tolerated. -Notify provider or care management team with questions and new concerns as needed   Goal Met         PLAN Rachel James is doing very well and has met her chronic care management goals. She is in the process of relocating to another state. Agreeable to final outreach within the next month to ensure all medications are received and address any new/urgent concerns prior to her relocating.    Cristy Friedlander Health/[facility] Care Management [facility] [phone]

## 2021-06-02 NOTE — Telephone Encounter (Signed)
Last seen 4.21.2022 no upcoming appt

## 2021-06-03 ENCOUNTER — Telehealth: Payer: Self-pay

## 2021-06-03 NOTE — Telephone Encounter (Signed)
Also tried to set something up before she leaves and could not seem to find a date for her that would work. Please give the patient a call and advise what we or her needs to do.

## 2021-06-03 NOTE — Telephone Encounter (Signed)
We had a request for med refills and Kristeen Miss wrote that she is given a 3 month supply on them except for the HTN and that the patient needs a appt in the next month. Spoke with the pt and she is moving out of state as of July 1st 2022 but will be traveling before she settles into new home.

## 2021-06-03 NOTE — Telephone Encounter (Signed)
Sent in yesterday.

## 2021-06-04 ENCOUNTER — Other Ambulatory Visit: Payer: Self-pay

## 2021-06-04 ENCOUNTER — Telehealth: Payer: Self-pay

## 2021-06-04 DIAGNOSIS — I10 Essential (primary) hypertension: Secondary | ICD-10-CM

## 2021-06-04 DIAGNOSIS — M5412 Radiculopathy, cervical region: Secondary | ICD-10-CM

## 2021-06-04 DIAGNOSIS — E785 Hyperlipidemia, unspecified: Secondary | ICD-10-CM

## 2021-06-04 MED ORDER — LOSARTAN POTASSIUM 100 MG PO TABS: 100.0000 mg | ORAL_TABLET | Freq: Every day | ORAL | 0 refills | Status: AC

## 2021-06-04 MED ORDER — SIMVASTATIN 10 MG PO TABS: 10.0000 mg | ORAL_TABLET | Freq: Every day | ORAL | 0 refills | Status: AC

## 2021-06-04 NOTE — Telephone Encounter (Signed)
Copied from [place] [phone]. Topic: General - Other >> Jun 04, 2021  8:15 AM Keene Breath wrote: Reason for CRM: Patient would like the nurse to call regarding her prescriptions since she is moving out of town.  Please call patient to discuss.

## 2021-06-22 NOTE — Patient Instructions (Signed)
Thank you for allowing the Chronic Care Management team to participate in your care. It was a pleasure speaking with you. Please feel free to contact me with questions.   Goals Addressed: Patient Care Plan: Hypertension (Adult)   Problem Identified: Hypertension (Hypertension)      Long-Range Goal: Hypertension Monitored Completed 06/02/2021  Start Date: 05/10/2021  Expected End Date: 09/07/2021  Priority: High   Current Barriers:  Chronic Disease Management support and educational needs r/t Hypertension and Dyslipidemia.  Case Manager Clinical Goal(s):  Over the next 120 days, patient will demonstrate improved adherence to prescribed treatment plan as evidenced by taking all medications as prescribed, monitoring and recording blood pressure, and adhering to a cardiac prudent/heart healthy diet.   Interventions:  Collaboration with Delsa Grana, PA-C regarding development and update of comprehensive plan of care as evidenced by provider attestation and co-signature Inter-disciplinary care team collaboration (see longitudinal plan of care) Reviewed medications and compliance with current treatment plan. Reports excellent compliance with medications. Tolerating prescribed regimen well. No concerns regarding medication management or prescription cost. She is in the process of relocating. Agreed to keep the clinic and care management team updated regarding needed refills.  Reviewed established blood pressure parameters along with indications for notifying a provider. Reports BP readings have been within range. Denies episodes of chest pain, palpitations, headaches, or dizziness. Reviewed compliance with activity and recommended cardiac diet. Remains compliant with nutritional intake and frequent activity. Continues to engage in activities at the local [employer] three times a week. She remains very motivated to improve her overall health.  Reviewed s/sx of heart attack, stroke and worsening symptoms  that require immediate medical attention.    Patient Goals/Self-Care Activities: -Self-administer medications as prescribed -Attend all scheduled provider appointments -Monitor and record blood pressure -Adhere to recommended cardiac prudent/heart healthy diet -Continue to engage in low impact activity as tolerated. -Notify provider or care management team with questions and new concerns as needed   Goal Met         PLAN Ms. Caya verbalized understanding of the information discussed during the telephonic outreach today. She is doing very well and has met her chronic care management goals. She is in the process of reloc[place] to another state. Agreeable to final outreach within the next month to ensure all medications are received and address any new/urgent concerns prior to her relocating.    [facility] Care [facility] [phone]

## 2021-06-28 ENCOUNTER — Telehealth: Payer: Self-pay | Admitting: Family Medicine

## 2021-06-28 ENCOUNTER — Ambulatory Visit: Payer: Self-pay | Admitting: *Deleted

## 2021-06-28 DIAGNOSIS — M5412 Radiculopathy, cervical region: Secondary | ICD-10-CM

## 2021-06-28 NOTE — Telephone Encounter (Signed)
Last sen 4.21.2022 no future sch'd appts

## 2021-06-28 NOTE — Telephone Encounter (Signed)
Copied from [place] [id number]. Topic: General - Other >> Jun 28, 2021 12:39 PM Erick Blinks wrote: Reason for CRM: Pt called reporting that she needs a refill of her steroid for her neck pain. Pain not described as severe but medium pain level. She says PCP knows name of her regimen.   [place], [address] [place] [address] Phone: [phone] Fax: [phone]

## 2021-06-28 NOTE — Telephone Encounter (Signed)
Call to Rachel James- to assess her neck pain. Rachel James states she has just completed a move out of state and has not obtained care locally yet. Rachel James states this is a chronic problem for her and she normally used warm heat rub for neck pain- but sometimes has to have steriod treatment for the pain. She states she is requesting that today. Rachel James states she would like Dr Ancil Boozer to send her in treatment today so she does not have to go to [facility].

## 2021-06-28 NOTE — Telephone Encounter (Signed)
Summary: Neck pain   Pt called reporting of neck pain, requesting a refill of her steroids. She reports the pain as medium level pain, not severe she clarified.   Best contact: [phone]     Reason for Disposition  Neck pain is a chronic symptom (recurrent or ongoing AND present > 4 weeks)  Answer Assessment - Initial Assessment Questions 1. ONSET: "When did the pain begin?"      1 week- Saturday 2. LOCATION: "Where does it hurt?"      Back and forth- R to L in neck into shoulder blade 3. PATTERN "Does the pain come and go, or has it been constant since it started?"      Constant- rub does help at times 4. SEVERITY: "How bad is the pain?"  (Scale 1-10; or mild, moderate, severe)   - NO PAIN (0): no pain or only slight stiffness    - MILD (1-3): doesn't interfere with normal activities    - MODERATE (4-7): interferes with normal activities or awakens from sleep    - SEVERE (8-10):  excruciating pain, unable to do any normal activities      Heating rub was helping- but last night it got worse 5. RADIATION: "Does the pain go anywhere else, shoot into your arms?"     Shoulder blades 6. CORD SYMPTOMS: "Any weakness or numbness of the arms or legs?"     no 7. CAUSE: "What do you think is causing the neck pain?"     Chronic neck pain- has had surgery in the past 8. NECK OVERUSE: "Any recent activities that involved turning or twisting the neck?"     No- stress  can make it worse- patient just moved 9. OTHER SYMPTOMS: "Do you have any other symptoms?" (e.g., headache, fever, chest pain, difficulty breathing, neck swelling)     no 10. PREGNANCY: "Is there any chance you are pregnant?" "When was your last menstrual period?"       N/a  Protocols used: Neck Pain or Stiffness-A-AH

## 2021-06-29 NOTE — Telephone Encounter (Signed)
Pt informed that prescription was denied due to pt must be seen. Pt currently not able to come into the office due to being out of town. Therefore I suggested urgent care

## 2021-06-29 NOTE — Telephone Encounter (Signed)
Pt called to follow up on refill request for steroid/ please advise

## 2021-07-08 ENCOUNTER — Ambulatory Visit (INDEPENDENT_AMBULATORY_CARE_PROVIDER_SITE_OTHER): Payer: Medicare Other

## 2021-07-08 DIAGNOSIS — I10 Essential (primary) hypertension: Secondary | ICD-10-CM | POA: Diagnosis not present

## 2021-07-08 NOTE — Chronic Care Management (AMB) (Signed)
Chronic Care Management   CCM RN Visit Note  07/08/2021 Name: ARLYN BUMPUS MRN: [id number] DOB: February 17, 1946  Subjective: SHANTICE MENGER is a 75 y.o. year old female who is a primary care patient of No primary care provider on file.. The care management team was consulted for assistance with disease management and care coordination needs.    Engaged with patient by telephone for follow up visit in response to provider referral for case management and/or care coordination services.   Consent to Services:  The patient was given information about Chronic Care Management services, agreed to services, and gave verbal consent prior to initiation of services.  Please see initial visit note for detailed documentation.    Assessment: Review of patient past medical history, allergies, medications, health status, including review of consultants reports, laboratory and other test data, was performed as part of comprehensive evaluation and provision of chronic care management services.   SDOH (Social Determinants of Health) assessments and interventions performed:  No  CCM Care Plan  Allergies  Allergen Reactions   Codeine Itching and Other (See Comments)    Nightmare    Tramadol Hcl     Light headed and nauseated   Oxycodone Itching    Outpatient Encounter Medications as of 07/08/2021  Medication Sig Note   acetaminophen (TYLENOL) 500 MG tablet Take 1,000 mg by mouth 2 (two) times daily.    docusate sodium (COLACE) 100 MG capsule Take 100 mg by mouth daily as needed for mild constipation.     famotidine (PEPCID) 40 MG tablet Take 1 tablet (40 mg total) by mouth 2 (two) times daily as needed for heartburn or indigestion.    fluticasone (FLONASE) 50 MCG/ACT nasal spray Place 1 spray into both nostrils daily as needed for allergies.     hydrochlorothiazide (MICROZIDE) 12.5 MG capsule Take 1 capsule (12.5 mg total) by mouth daily.    losartan (COZAAR) 100 MG tablet Take 1 tablet (100 mg total)  by mouth daily.    magnesium oxide (MAG-OX) 400 (241.3 Mg) MG tablet Take 400 mg by mouth at bedtime.     metoprolol succinate (TOPROL-XL) 100 MG 24 hr tablet Take 1 tablet (100 mg total) by mouth daily. Take with or immediately following a meal.    Multiple Vitamins-Minerals (MULTIVITAMIN PO) Take 1 tablet by mouth daily.    naproxen sodium (ALEVE) 220 MG tablet Take 220 mg by mouth 2 (two) times daily as needed.    oxymetazoline (AFRIN) 0.05 % nasal spray Place 1 spray into both nostrils daily as needed for congestion.    predniSONE (DELTASONE) 10 MG tablet Take six pills by mouth on day 1, then 5 on day 2, 4 on day 3, 3 on day 4, 2 on day 5, and 1 on day 6    Propylene Glycol-Glycerin (SOOTHE OP) Place 1 drop into both eyes 3 (three) times daily as needed (dry eyes). 02/11/2021: PRN only    simvastatin (ZOCOR) 10 MG tablet Take 1 tablet (10 mg total) by mouth daily.    tiZANidine (ZANAFLEX) 4 MG tablet Take 1 tablet (4 mg total) by mouth every 8 (eight) hours as needed for muscle spasms.    No facility-administered encounter medications on file as of 07/08/2021.    Patient Active Problem List   Diagnosis Date Noted   S/P reverse total shoulder arthroplasty, left 11/17/2020   Hx of total hip arthroplasty, left 06/10/2020   Primary osteoarthritis of left shoulder 06/08/2020   Cold extremities 04/07/2020  Primary osteoarthritis of left hip 04/05/2020   Neck pain, chronic 01/15/2020   Status post total right knee replacement 11/04/2019   Cervical radiculitis 08/12/2019   Dysfunction of Eustachian tube, left 08/12/2019   Chronic jaw pain 08/12/2019   Primary osteoarthritis of right knee 03/17/2019   Scoliosis (and kyphoscoliosis), idiopathic 03/17/2019   Status post total shoulder arthroplasty 08/09/2018   Hyponatremia 03/31/2018   Chronic right shoulder pain 09/26/2017   Sciatica of left side 08/06/2017   CKD (chronic kidney disease) stage 2, GFR 60-89 ml/min 07/11/2017   Osteopenia  06/22/2017   Neoplasm of uncertain behavior of skin of nose 09/08/2016   Shoulder pain, bilateral 09/08/2016   Need for prophylactic vaccination with Streptococcus pneumoniae (Pneumococcus) and Influenza vaccines 08/16/2016   Urinary frequency 08/16/2016   Cutaneous skin tags 04/18/2016   Accidental fall 12/09/2015   Hyperlipidemia LDL goal <100 09/17/2015   Degenerative arthritis of lumbar spine with cord compression 07/16/2015   Lumbar and sacral osteoarthritis 07/16/2015   Bilateral change in hearing 05/27/2015   Spinal stenosis, multilevel 05/27/2015   Gastroesophageal reflux disease without esophagitis 05/27/2015   Chronic radicular low back pain 05/27/2015   Allergic rhinitis 01/06/2014   Osteoarthrosis involving multiple sites 01/06/2014   Arthropathia 01/06/2014   Hypertension goal BP (blood pressure) < 140/90 01/06/2014   Arthritis of right knee 01/06/2014    Conditions to be addressed/monitored:HTN  Care Plan : Hypertension (Adult)  Updates made by Neldon Labella, RN since 07/08/2021   Completed 07/08/2021   Problem: Hypertension (Hypertension) Resolved 07/08/2021      PLAN: Ms. Sweatt previously met her care management goal. Final outreach was conducted today to confirm needs were addressed prior to relocating. She will contact the clinic if medication refills are required prior to her permanent move. No further nursing outreach required.   [facility] Care [facility] [phone]

## 2021-07-26 ENCOUNTER — Telehealth: Payer: Self-pay

## 2021-07-26 ENCOUNTER — Other Ambulatory Visit: Payer: Self-pay

## 2021-07-26 DIAGNOSIS — I10 Essential (primary) hypertension: Secondary | ICD-10-CM

## 2021-07-26 MED ORDER — METOPROLOL SUCCINATE ER 100 MG PO TB24: 100.0000 mg | ORAL_TABLET | Freq: Every day | ORAL | 0 refills | Status: AC

## 2021-07-26 NOTE — Telephone Encounter (Signed)
Copied from [place] [id number]. Topic: General - Other >> Jul 26, 2021 11:23 AM Bayard Beaver wrote: Reason for CRM: patient trying to get refill of blood pressure medicine, says she saw Dr Lucio Edward in April but I'm not showing this. Please confirm if she needs to schedule a new pt appt first.

## 2021-07-27 ENCOUNTER — Ambulatory Visit: Payer: Medicare Other | Admitting: Family Medicine

## 2021-08-03 ENCOUNTER — Ambulatory Visit: Payer: Medicare Other | Admitting: Family Medicine

## 2021-09-03 IMAGING — DX DG SHOULDER 1V*L*
2 series · 2 of 2 positions shown · non-contrast
Comparison: None.

CLINICAL DATA: Post reverse arthroplasty of the left shoulder.

EXAM:
LEFT SHOULDER

[shoulder ap]
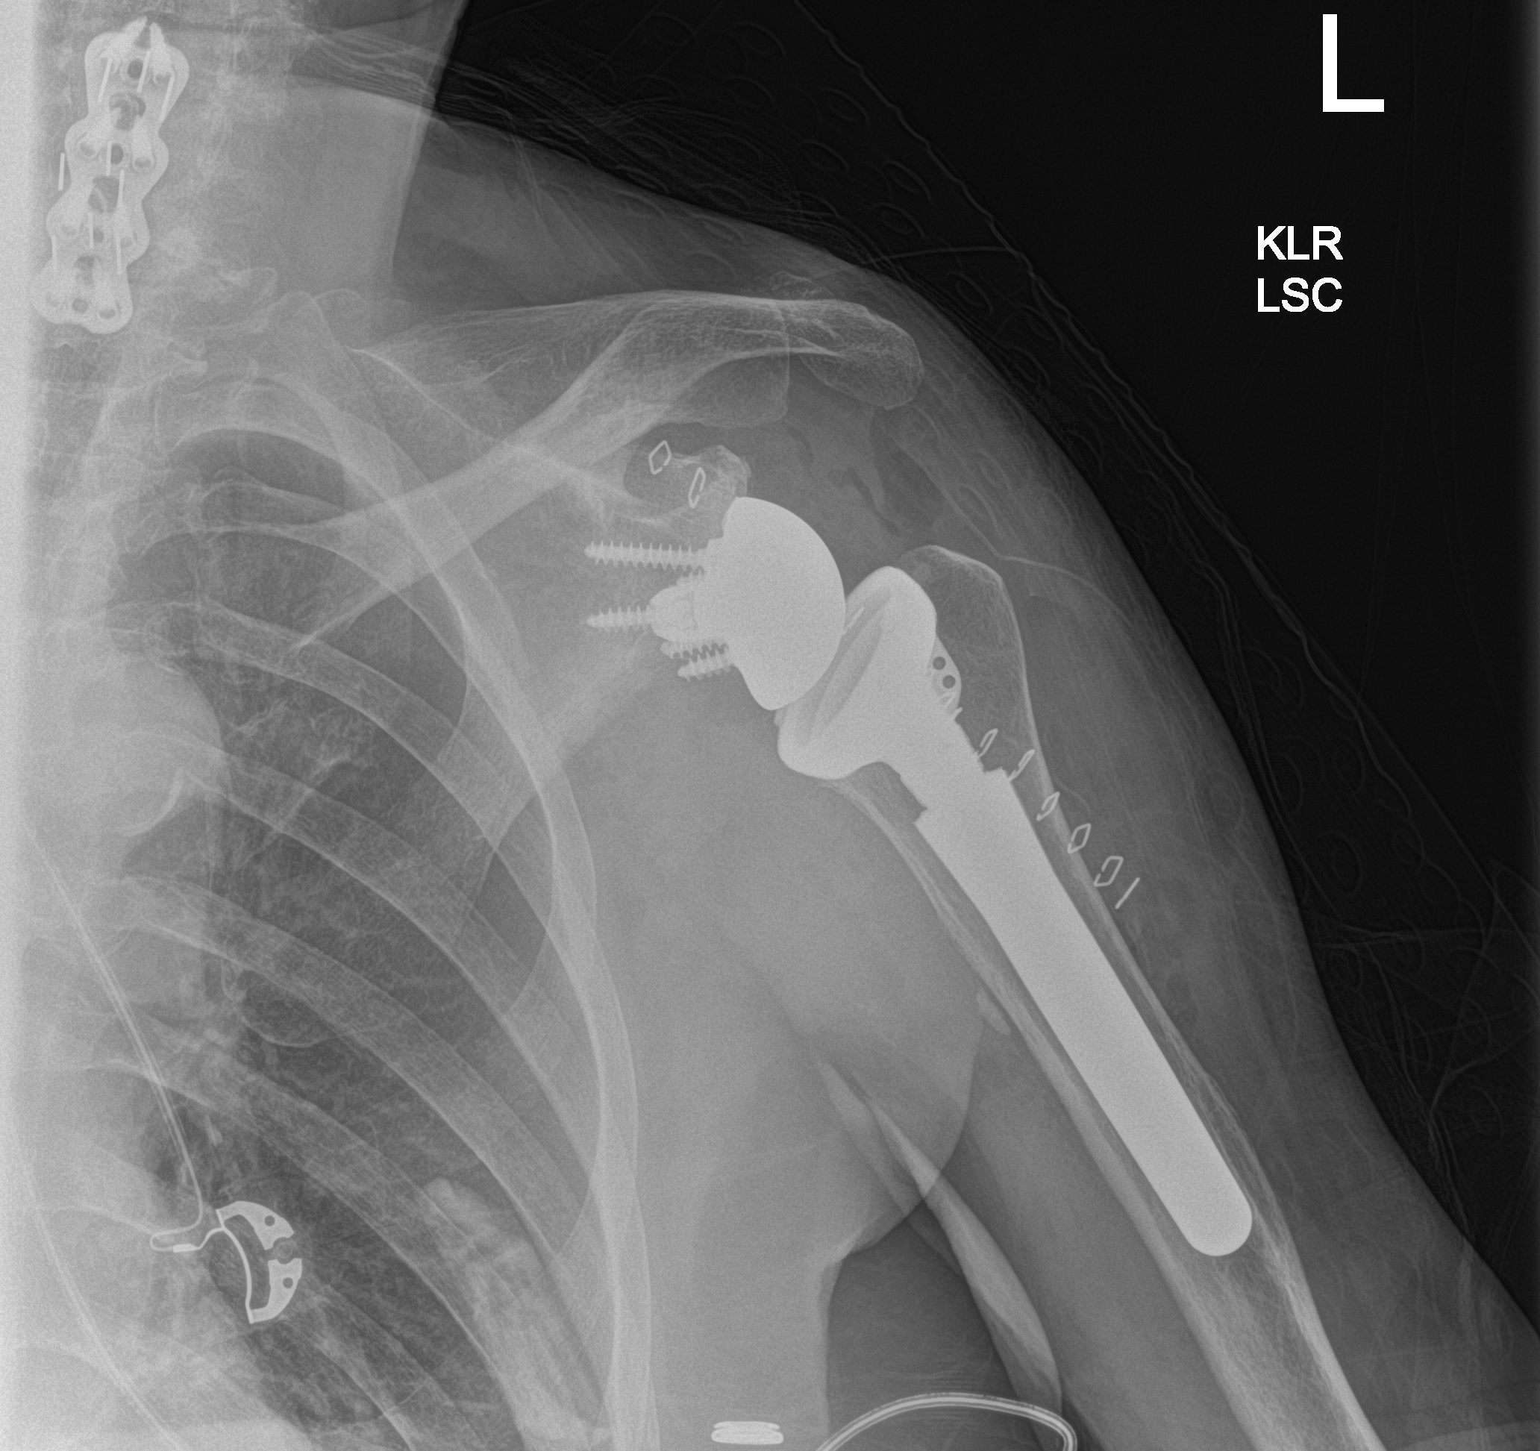

[shoulder obl]
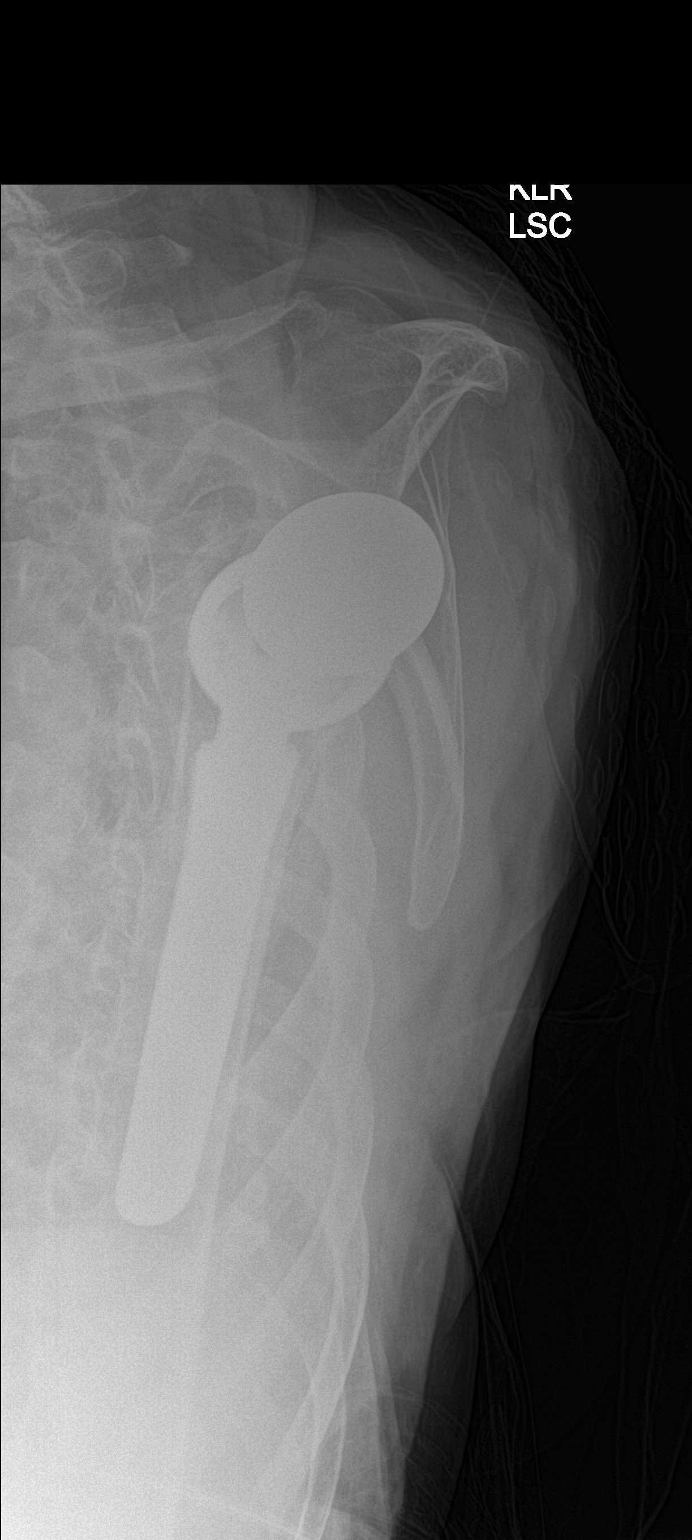

[2 of 2 positions shown; findings below may reference images not displayed]

FINDINGS: Post left total shoulder replacement. Alignment appears anatomic. No
evidence of hardware failure or loosening. There is a minimal amount
of intra-articular air. Overlying skin staples. No radiopaque
foreign body. Limited visualization of the left hemithorax
demonstrates apparent radiopaque support apparatus overlying the
peripheral aspect of the left mid lung. Atherosclerotic plaque
within the aortic arch. Post lower cervical ACDF, incompletely
evaluated.
IMPRESSION: Post left total shoulder replacement without evidence of hardware
failure or loosening.

## 2022-07-26 ENCOUNTER — Ambulatory Visit
Admission: EM | Admit: 2022-07-26 | Discharge: 2022-07-26 | Disposition: A | Discharge status: Home / Self Care | Payer: Medicare PPO | Attending: Emergency Medicine | Admitting: Emergency Medicine

## 2022-07-26 DIAGNOSIS — S161XXA Strain of muscle, fascia and tendon at neck level, initial encounter: Secondary | ICD-10-CM | POA: Diagnosis not present

## 2022-07-26 MED ORDER — PREDNISONE 10 MG (21) PO TBPK: ORAL_TABLET | ORAL | 0 refills | Status: DC

## 2022-07-26 MED ORDER — BACLOFEN 10 MG PO TABS: 10.0000 mg | ORAL_TABLET | Freq: Three times a day (TID) | ORAL | 0 refills | Status: AC

## 2022-07-26 MED ORDER — PREDNISONE 10 MG (21) PO TBPK: ORAL_TABLET | ORAL | 0 refills | Status: AC

## 2022-07-26 NOTE — ED Triage Notes (Signed)
Patient to [facility] with complaints of neck pain, reports she has had issues with it hurting over the last 12 years. States over the last 3 days it has been aggravated and hurting more than usual. Denies any new injury. Reports driving from [place] yesterday. Has been taking naproxen and using deep heat.   Reports in the past, the only remedy that has helped were steroids.

## 2022-07-26 NOTE — Discharge Instructions (Signed)
Take the Prednisone according to the package instructions.  Take the baclofen, 10 mg every 8 hours, on a schedule for the next 48 hours and then as needed.  Apply moist heat to your neck for 30 minutes at a time 2-3 times a day to improve blood flow to the area and help remove the lactic acid causing the spasm.  Follow the neck exercises given at discharge.  Massage therapy, Rolfing, and acupuncture can all be helpful in relieving your muscle stress.  Return for reevaluation for any new or worsening symptoms.

## 2022-07-26 NOTE — ED Provider Notes (Signed)
[facility]    CSN: [id number] Arrival date & time: 07/26/22  1630      History   Chief Complaint Chief Complaint  Patient presents with   Neck Injury    HPI Rachel James is a 76 y.o. female.   HPI  76 year old female here for evaluation of neck pain.  Patient reports that she has been experiencing similar symptoms off and on for the last 12 years since having spinal cord decompression and cervical fusion.  She will develop pain in her upper back that radiates into her neck.  She is currently been experiencing 1 of these episodes on the left side and has been present for last 2 days.  She has been taking Tylenol without any improvement of her symptoms and then switched to Aleve.  This 2 has not improved her symptoms.  She states that when she gets at this level typically she needs prednisone.  She denies any new injuries.  She also denies any numbness, tingling, or weakness in any of her extremities.  Past Medical History:  Diagnosis Date   Allergy    Arthritis    knees, lower back   Cervical radiculitis    Chronic radicular low back pain    CKD (chronic kidney disease) stage 2, GFR 60-89 ml/min 07/11/2017   Degenerative disc disease, lumbar    GERD (gastroesophageal reflux disease)    Hyperlipidemia LDL goal <100    Hypertension    Osteopenia 06/22/2017   June 2016 DEXA   Pneumonia 2012   PONV (postoperative nausea and vomiting)    in past.  none recently    Patient Active Problem List   Diagnosis Date Noted   S/P reverse total shoulder arthroplasty, left 11/17/2020   Hx of total hip arthroplasty, left 06/10/2020   Primary osteoarthritis of left shoulder 06/08/2020   Cold extremities 04/07/2020   Primary osteoarthritis of left hip 04/05/2020   Neck pain, chronic 01/15/2020   Status post total right knee replacement 11/04/2019   Cervical radiculitis 08/12/2019   Dysfunction of Eustachian tube, left 08/12/2019   Chronic jaw pain 08/12/2019   Primary  osteoarthritis of right knee 03/17/2019   Scoliosis (and kyphoscoliosis), idiopathic 03/17/2019   Status post total shoulder arthroplasty 08/09/2018   Hyponatremia 03/31/2018   Chronic right shoulder pain 09/26/2017   Sciatica of left side 08/06/2017   CKD (chronic kidney disease) stage 2, GFR 60-89 ml/min 07/11/2017   Osteopenia 06/22/2017   Neoplasm of uncertain behavior of skin of nose 09/08/2016   Shoulder pain, bilateral 09/08/2016   Need for prophylactic vaccination with Streptococcus pneumoniae (Pneumococcus) and Influenza vaccines 08/16/2016   Urinary frequency 08/16/2016   Cutaneous skin tags 04/18/2016   Accidental fall 12/09/2015   Hyperlipidemia LDL goal <100 09/17/2015   Degenerative arthritis of lumbar spine with cord compression 07/16/2015   Lumbar and sacral osteoarthritis 07/16/2015   Bilateral change in hearing 05/27/2015   Spinal stenosis, multilevel 05/27/2015   Gastroesophageal reflux disease without esophagitis 05/27/2015   Chronic radicular low back pain 05/27/2015   Allergic rhinitis 01/06/2014   Osteoarthrosis involving multiple sites 01/06/2014   Arthropathia 01/06/2014   Hypertension goal BP (blood pressure) < 140/90 01/06/2014   Arthritis of right knee 01/06/2014    Past Surgical History:  Procedure Laterality Date    THUMB SURGERY Left 2008   joint replacement   APPENDECTOMY     BREAST BIOPSY Right 1992   neg   BREAST LUMPECTOMY Right 1992   BUNIONECTOMY Bilateral  1978, 2002   CERVICAL DISCECTOMY  09/18/2012   C4-C5 AND C6-C7 ACDF    COLONOSCOPY     JOINT REPLACEMENT  2008   thumb   JOINT REPLACEMENT Right 2019   shoulder   KNEE ARTHROPLASTY Right 11/04/2019   Procedure: COMPUTER ASSISTED TOTAL KNEE ARTHROPLASTY;  Surgeon: Dereck Leep, MD;  Location: [facility];  Service: Orthopedics;  Laterality: Right;   REVERSE SHOULDER ARTHROPLASTY Left 11/17/2020   Procedure: REVERSE SHOULDER ARTHROPLASTY;  Surgeon: Corky Mull, MD;  Location: [facility]  [facility];  Service: Orthopedics;  Laterality: Left;   SHOULDER ARTHROSCOPY WITH ROTATOR CUFF REPAIR AND OPEN BICEPS TENODESIS Right 12/20/2017   Procedure: SHOULDER ARTHROSCOPY WITH DEBRIDEMENT, DECOMPRESSION AND  BICEPS TENODESIS;  Surgeon: Corky Mull, MD;  Location: [place];  Service: Orthopedics;  Laterality: Right;   SPINE SURGERY  2013   cervical   TOTAL HIP ARTHROPLASTY Left 06/10/2020   Procedure: TOTAL HIP ARTHROPLASTY;  Surgeon: Dereck Leep, MD;  Location: [facility];  Service: Orthopedics;  Laterality: Left;   TOTAL SHOULDER ARTHROPLASTY Right 08/09/2018   Procedure: RIGHT TOTAL SHOULDER ARTHROPLASTY;  Surgeon: Justice Britain, MD;  Location: [place];  Service: Orthopedics;  Laterality: Right;   TUBAL LIGATION Bilateral     OB History     Gravida  3   Para  3   Term  3   Preterm      AB      Living  3      SAB      IAB      Ectopic      Multiple      Live Births               Home Medications    Prior to Admission medications   Medication Sig Start Date End Date Taking? Authorizing Provider  baclofen (LIORESAL) 10 MG tablet Take 1 tablet (10 mg total) by mouth 3 (three) times daily. 07/26/22  Yes Margarette Canada, NP  predniSONE (STERAPRED UNI-PAK 21 TAB) 10 MG (21) TBPK tablet Take 6 tablets on day 1, 5 tablets day 2, 4 tablets day 3, 3 tablets day 4, 2 tablets day 5, 1 tablet day 6 07/26/22  Yes Margarette Canada, NP  acetaminophen (TYLENOL) 500 MG tablet Take 1,000 mg by mouth 2 (two) times daily.    [provider]  docusate sodium (COLACE) 100 MG capsule Take 100 mg by mouth daily as needed for mild constipation.     [provider]  famotidine (PEPCID) 40 MG tablet Take 1 tablet (40 mg total) by mouth 2 (two) times daily as needed for heartburn or indigestion. 06/02/21   Delsa Grana, PA-C  hydrochlorothiazide (MICROZIDE) 12.5 MG capsule Take 1 capsule (12.5 mg total) by mouth daily. 06/02/21   Delsa Grana, PA-C  losartan (COZAAR) 100 MG  tablet Take 1 tablet (100 mg total) by mouth daily. 06/04/21   Delsa Grana, PA-C  metoprolol succinate (TOPROL-XL) 100 MG 24 hr tablet Take 1 tablet (100 mg total) by mouth daily. Take with or immediately following a meal. 07/26/21   Delsa Grana, PA-C  Multiple Vitamins-Minerals (MULTIVITAMIN PO) Take 1 tablet by mouth daily.    [provider]  naproxen sodium (ALEVE) 220 MG tablet Take 220 mg by mouth 2 (two) times daily as needed.    [provider]  Propylene Glycol-Glycerin (SOOTHE OP) Place 1 drop into both eyes 3 (three) times daily as needed (dry eyes).    [provider]  simvastatin (ZOCOR) 10 MG tablet Take 1 tablet (10 mg total) by mouth daily. 06/04/21   Delsa Grana, PA-C  tiZANidine (ZANAFLEX) 4 MG tablet Take 1 tablet (4 mg total) by mouth every 8 (eight) hours as needed for muscle spasms. 01/06/21   Towanda Malkin, MD    Family History Family History  Problem Relation Age of Onset   Heart disease Mother    Heart attack Mother    Arthritis Mother    Heart disease Father    Heart disease Sister    Arthritis Sister    COPD Sister    Heart disease Brother    Heart failure Brother    Memory loss Brother    Arthritis Brother    Arthritis Maternal Grandmother    Diabetes Paternal Grandmother    Pulmonary embolism Sister    Breast cancer Neg Hx     Social History Social History   Tobacco Use   Smoking status: Never   Smokeless tobacco: Never   Tobacco comments:    smoking cessation materials not required  Vaping Use   Vaping Use: Never used  Substance Use Topics   Alcohol use: Not Currently    Alcohol/week: 0.0 standard drinks of alcohol    Comment: alcohol raises BP   Drug use: No     Allergies   Codeine, Tramadol hcl, and Oxycodone   Review of Systems Review of Systems  Musculoskeletal:  Positive for neck pain.  Neurological:  Negative for weakness and numbness.  Hematological: Negative.   Psychiatric/Behavioral:  Negative.       Physical Exam Triage Vital Signs ED Triage Vitals  Enc Vitals Group     BP 07/26/22 1638 (!) 142/96     Pulse Rate 07/26/22 1638 65     Resp 07/26/22 1638 19     Temp 07/26/22 1638 97.9 F (36.6 C)     Temp Source 07/26/22 1638 Oral     SpO2 07/26/22 1638 99 %     Weight --      Height 07/26/22 1641 '5\' 6"'$  (1.676 m)     Head Circumference --      Peak Flow --      Pain Score 07/26/22 1641 6     Pain Loc --      Pain Edu? --      Excl. in [place]? --    No data found.  Updated Vital Signs BP (!) 142/96   Pulse 65   Temp 97.9 F (36.6 C) (Oral)   Resp 19   Ht '5\' 6"'$  (1.676 m)   SpO2 99%   BMI 21.73 kg/m   Visual Acuity Right Eye Distance:   Left Eye Distance:   Bilateral Distance:    Right Eye Near:   Left Eye Near:    Bilateral Near:     Physical Exam Vitals and nursing note reviewed.  Constitutional:      Appearance: Normal appearance. She is not ill-appearing.  HENT:     Head: Normocephalic and atraumatic.  Musculoskeletal:        General: Tenderness present. No swelling, deformity or signs of injury. Normal range of motion.  Skin:    General: Skin is warm and dry.     Capillary Refill: Capillary refill takes less than 2 seconds.  Neurological:     General: No focal deficit present.     Mental Status: She is alert and oriented to person, place, and time.     Sensory: No  sensory deficit.     Motor: No weakness.  Psychiatric:        Mood and Affect: Mood normal.        Behavior: Behavior normal.        Thought Content: Thought content normal.        Judgment: Judgment normal.      UC Treatments / Results  Labs (all labs ordered are listed, but only abnormal results are displayed) Labs Reviewed - No data to display  EKG   Radiology No results found.  Procedures Procedures (including critical care time)  Medications Ordered in UC Medications - No data to display  Initial Impression / Assessment and Plan / UC Course  I have  reviewed the triage vital signs and the nursing notes.  Pertinent labs & imaging results that were available during my care of the patient were reviewed by me and considered in my medical decision making (see chart for details).  Patient is a very pleasant, nontoxic-appearing 76 year old female here for evaluation of left-sided neck pain that has been present for the last 2 days.  This not associated with any new injuries.  As stated in the HPI above, patient has intermittent pain in her upper shoulders and neck since having cervical fusion and spinal cord decompression surgery 12 years ago.  On exam patient has full range of motion of her neck and she is indicating that she is having pain in her upper trap.  Bilateral grips and upper extremity strength are 5/5.  She does not have any midline cervical spinous tenderness.  There is a trigger point in her middle trap between her scapula and her spine on the left.  There is also muscle tension in her upper trapezius muscles extending into the cervical paraspinous regions bilaterally with the left being greater than the right.  I suspect that the patient's muscle tension is was causing her pain.  I will treat her with prednisone and baclofen, home physical therapy, moist heat, and have also recommended that she look into bodywork to help with her musculoskeletal complaints.   Final Clinical Impressions(s) / UC Diagnoses   Final diagnoses:  Cervical strain, acute, initial encounter     Discharge Instructions      Take the Prednisone according to the package instructions.  Take the baclofen, 10 mg every 8 hours, on a schedule for the next 48 hours and then as needed.  Apply moist heat to your neck for 30 minutes at a time 2-3 times a day to improve blood flow to the area and help remove the lactic acid causing the spasm.  Follow the neck exercises given at discharge.  Massage therapy, Rolfing, and acupuncture can all be helpful in relieving your  muscle stress.  Return for reevaluation for any new or worsening symptoms.      ED Prescriptions     Medication Sig Dispense Auth. Provider   predniSONE (STERAPRED UNI-PAK 21 TAB) 10 MG (21) TBPK tablet Take 6 tablets on day 1, 5 tablets day 2, 4 tablets day 3, 3 tablets day 4, 2 tablets day 5, 1 tablet day 6 21 tablet Margarette Canada, NP   baclofen (LIORESAL) 10 MG tablet Take 1 tablet (10 mg total) by mouth 3 (three) times daily. 55 each Margarette Canada, NP      PDMP not reviewed this encounter.   Margarette Canada, NP 07/26/22 1700
